# Patient Record
Sex: Male | Born: 1940 | Race: White | Hispanic: No | Marital: Married
Health system: Southern US, Community
[De-identification: ages and names within clinical notes are randomized; demographics above are authoritative.]

## PROBLEM LIST (undated history)

## (undated) DIAGNOSIS — I509 Heart failure, unspecified: Secondary | ICD-10-CM

## (undated) DIAGNOSIS — G479 Sleep disorder, unspecified: Secondary | ICD-10-CM

## (undated) DIAGNOSIS — I639 Cerebral infarction, unspecified: Secondary | ICD-10-CM

## (undated) DIAGNOSIS — Z7901 Long term (current) use of anticoagulants: Secondary | ICD-10-CM

## (undated) DIAGNOSIS — I5042 Chronic combined systolic (congestive) and diastolic (congestive) heart failure: Secondary | ICD-10-CM

## (undated) DIAGNOSIS — I340 Nonrheumatic mitral (valve) insufficiency: Secondary | ICD-10-CM

## (undated) DIAGNOSIS — I428 Other cardiomyopathies: Secondary | ICD-10-CM

## (undated) DIAGNOSIS — Z95810 Presence of automatic (implantable) cardiac defibrillator: Secondary | ICD-10-CM

## (undated) DIAGNOSIS — I472 Ventricular tachycardia, unspecified: Secondary | ICD-10-CM

## (undated) DIAGNOSIS — F32A Depression, unspecified: Secondary | ICD-10-CM

## (undated) DIAGNOSIS — I5020 Unspecified systolic (congestive) heart failure: Secondary | ICD-10-CM

## (undated) DIAGNOSIS — B356 Tinea cruris: Secondary | ICD-10-CM

## (undated) DIAGNOSIS — I4891 Unspecified atrial fibrillation: Secondary | ICD-10-CM

## (undated) DIAGNOSIS — R0602 Shortness of breath: Secondary | ICD-10-CM

## (undated) DIAGNOSIS — I2510 Atherosclerotic heart disease of native coronary artery without angina pectoris: Secondary | ICD-10-CM

## (undated) DIAGNOSIS — F0280 Dementia in other diseases classified elsewhere without behavioral disturbance: Secondary | ICD-10-CM

## (undated) DIAGNOSIS — G44229 Chronic tension-type headache, not intractable: Secondary | ICD-10-CM

## (undated) DIAGNOSIS — R011 Cardiac murmur, unspecified: Secondary | ICD-10-CM

## (undated) DIAGNOSIS — I4819 Other persistent atrial fibrillation: Secondary | ICD-10-CM

## (undated) DIAGNOSIS — B351 Tinea unguium: Secondary | ICD-10-CM

## (undated) DIAGNOSIS — I071 Rheumatic tricuspid insufficiency: Secondary | ICD-10-CM

## (undated) DIAGNOSIS — N183 Chronic kidney disease, stage 3 unspecified: Secondary | ICD-10-CM

## (undated) DIAGNOSIS — R001 Bradycardia, unspecified: Secondary | ICD-10-CM

## (undated) DIAGNOSIS — F329 Major depressive disorder, single episode, unspecified: Secondary | ICD-10-CM

## (undated) DIAGNOSIS — I10 Essential (primary) hypertension: Secondary | ICD-10-CM

## (undated) DIAGNOSIS — R55 Syncope and collapse: Secondary | ICD-10-CM

## (undated) DIAGNOSIS — K449 Diaphragmatic hernia without obstruction or gangrene: Secondary | ICD-10-CM

## (undated) DIAGNOSIS — Z950 Presence of cardiac pacemaker: Secondary | ICD-10-CM

## (undated) DIAGNOSIS — I5023 Acute on chronic systolic (congestive) heart failure: Secondary | ICD-10-CM

## (undated) DIAGNOSIS — I209 Angina pectoris, unspecified: Secondary | ICD-10-CM

## (undated) DIAGNOSIS — M1990 Unspecified osteoarthritis, unspecified site: Secondary | ICD-10-CM

## (undated) DIAGNOSIS — J189 Pneumonia, unspecified organism: Secondary | ICD-10-CM

## (undated) HISTORY — DX: Diaphragmatic hernia without obstruction or gangrene: K44.9

## (undated) HISTORY — DX: Chronic tension-type headache, not intractable: G44.229

## (undated) HISTORY — DX: Cerebral infarction, unspecified: I63.9

## (undated) HISTORY — DX: Tinea unguium: B35.1

## (undated) HISTORY — DX: Syncope and collapse: R55

## (undated) HISTORY — DX: Ventricular tachycardia, unspecified: I47.20

## (undated) HISTORY — DX: Sleep disorder, unspecified: G47.9

## (undated) HISTORY — PX: INSERT / REPLACE / REMOVE PACEMAKER: SUR710

## (undated) HISTORY — DX: Bradycardia, unspecified: R00.1

## (undated) HISTORY — DX: Tinea cruris: B35.6

## (undated) HISTORY — DX: Unspecified atrial fibrillation: I48.91

## (undated) HISTORY — DX: Ventricular tachycardia: I47.2

## (undated) HISTORY — DX: Essential (primary) hypertension: I10

## (undated) HISTORY — DX: Atherosclerotic heart disease of native coronary artery without angina pectoris: I25.10

## (undated) HISTORY — PX: VASECTOMY: SHX75

---

## 1995-04-22 DIAGNOSIS — I428 Other cardiomyopathies: Secondary | ICD-10-CM

## 1995-04-22 HISTORY — DX: Other cardiomyopathies: I42.8

## 2000-02-19 ENCOUNTER — Encounter: Payer: Self-pay | Admitting: Family Medicine

## 2000-02-19 ENCOUNTER — Encounter: Admission: RE | Admit: 2000-02-19 | Discharge: 2000-02-19 | Payer: Self-pay | Admitting: Family Medicine

## 2001-03-31 ENCOUNTER — Encounter: Admission: RE | Admit: 2001-03-31 | Discharge: 2001-03-31 | Payer: Self-pay | Admitting: Family Medicine

## 2001-03-31 ENCOUNTER — Encounter: Payer: Self-pay | Admitting: Family Medicine

## 2001-06-05 ENCOUNTER — Emergency Department (HOSPITAL_COMMUNITY): Admission: EM | Admit: 2001-06-05 | Discharge: 2001-06-05 | Payer: Self-pay | Admitting: Emergency Medicine

## 2003-01-30 ENCOUNTER — Encounter: Payer: Self-pay | Admitting: Emergency Medicine

## 2003-01-30 ENCOUNTER — Encounter: Payer: Self-pay | Admitting: Neurology

## 2003-01-30 ENCOUNTER — Inpatient Hospital Stay (HOSPITAL_COMMUNITY): Admission: EM | Admit: 2003-01-30 | Discharge: 2003-02-12 | Payer: Self-pay | Admitting: Emergency Medicine

## 2003-01-31 ENCOUNTER — Encounter (INDEPENDENT_AMBULATORY_CARE_PROVIDER_SITE_OTHER): Payer: Self-pay | Admitting: Cardiology

## 2003-02-02 ENCOUNTER — Encounter: Payer: Self-pay | Admitting: Neurology

## 2003-02-03 HISTORY — PX: CARDIAC CATHETERIZATION: SHX172

## 2003-02-04 ENCOUNTER — Encounter: Payer: Self-pay | Admitting: Neurology

## 2003-02-08 ENCOUNTER — Encounter: Payer: Self-pay | Admitting: Internal Medicine

## 2003-04-24 ENCOUNTER — Encounter (HOSPITAL_COMMUNITY): Admission: RE | Admit: 2003-04-24 | Discharge: 2003-07-23 | Payer: Self-pay | Admitting: Cardiology

## 2004-04-23 ENCOUNTER — Ambulatory Visit: Payer: Self-pay

## 2004-09-12 ENCOUNTER — Emergency Department (HOSPITAL_COMMUNITY): Admission: EM | Admit: 2004-09-12 | Discharge: 2004-09-12 | Payer: Self-pay | Admitting: Emergency Medicine

## 2005-08-20 ENCOUNTER — Encounter: Payer: Self-pay | Admitting: Neurology

## 2006-01-01 ENCOUNTER — Ambulatory Visit: Payer: Self-pay | Admitting: Family Medicine

## 2006-02-05 ENCOUNTER — Ambulatory Visit: Payer: Self-pay | Admitting: Family Medicine

## 2006-03-06 ENCOUNTER — Ambulatory Visit: Payer: Self-pay

## 2006-04-27 ENCOUNTER — Ambulatory Visit: Payer: Self-pay | Admitting: Family Medicine

## 2006-12-18 ENCOUNTER — Encounter: Admission: RE | Admit: 2006-12-18 | Discharge: 2006-12-18 | Payer: Self-pay | Admitting: Family Medicine

## 2006-12-18 ENCOUNTER — Ambulatory Visit: Payer: Self-pay | Admitting: Family Medicine

## 2007-01-19 ENCOUNTER — Ambulatory Visit: Payer: Self-pay | Admitting: Family Medicine

## 2007-02-11 ENCOUNTER — Ambulatory Visit: Payer: Self-pay | Admitting: Family Medicine

## 2007-04-05 ENCOUNTER — Ambulatory Visit: Payer: Self-pay | Admitting: Family Medicine

## 2007-09-02 ENCOUNTER — Ambulatory Visit: Payer: Self-pay | Admitting: Family Medicine

## 2007-12-15 ENCOUNTER — Ambulatory Visit: Payer: Self-pay | Admitting: Family Medicine

## 2008-06-06 ENCOUNTER — Inpatient Hospital Stay (HOSPITAL_COMMUNITY): Admission: EM | Admit: 2008-06-06 | Discharge: 2008-06-09 | Payer: Self-pay | Admitting: Emergency Medicine

## 2008-06-06 ENCOUNTER — Ambulatory Visit: Payer: Self-pay | Admitting: Internal Medicine

## 2008-06-12 ENCOUNTER — Ambulatory Visit: Payer: Self-pay | Admitting: Internal Medicine

## 2008-06-12 ENCOUNTER — Observation Stay (HOSPITAL_COMMUNITY): Admission: RE | Admit: 2008-06-12 | Discharge: 2008-06-13 | Payer: Self-pay | Admitting: Internal Medicine

## 2008-06-12 HISTORY — PX: OTHER SURGICAL HISTORY: SHX169

## 2008-06-30 ENCOUNTER — Encounter: Payer: Self-pay | Admitting: Internal Medicine

## 2008-07-03 ENCOUNTER — Ambulatory Visit: Payer: Self-pay

## 2008-07-13 ENCOUNTER — Ambulatory Visit: Payer: Self-pay | Admitting: Family Medicine

## 2008-08-15 ENCOUNTER — Ambulatory Visit: Payer: Self-pay | Admitting: Family Medicine

## 2008-09-28 DIAGNOSIS — I428 Other cardiomyopathies: Secondary | ICD-10-CM

## 2008-09-28 DIAGNOSIS — Z95810 Presence of automatic (implantable) cardiac defibrillator: Secondary | ICD-10-CM | POA: Insufficient documentation

## 2008-09-28 DIAGNOSIS — I6350 Cerebral infarction due to unspecified occlusion or stenosis of unspecified cerebral artery: Secondary | ICD-10-CM | POA: Insufficient documentation

## 2008-09-28 DIAGNOSIS — G479 Sleep disorder, unspecified: Secondary | ICD-10-CM | POA: Insufficient documentation

## 2008-09-28 DIAGNOSIS — I472 Ventricular tachycardia, unspecified: Secondary | ICD-10-CM | POA: Insufficient documentation

## 2008-09-28 DIAGNOSIS — R0609 Other forms of dyspnea: Secondary | ICD-10-CM | POA: Insufficient documentation

## 2008-09-28 DIAGNOSIS — I4819 Other persistent atrial fibrillation: Secondary | ICD-10-CM

## 2008-09-28 DIAGNOSIS — I2510 Atherosclerotic heart disease of native coronary artery without angina pectoris: Secondary | ICD-10-CM | POA: Insufficient documentation

## 2008-09-28 DIAGNOSIS — R404 Transient alteration of awareness: Secondary | ICD-10-CM | POA: Insufficient documentation

## 2008-09-28 DIAGNOSIS — R0989 Other specified symptoms and signs involving the circulatory and respiratory systems: Secondary | ICD-10-CM

## 2008-10-10 ENCOUNTER — Ambulatory Visit: Payer: Self-pay | Admitting: Internal Medicine

## 2008-12-07 ENCOUNTER — Ambulatory Visit: Payer: Self-pay | Admitting: Family Medicine

## 2008-12-11 ENCOUNTER — Encounter: Payer: Self-pay | Admitting: Internal Medicine

## 2008-12-12 ENCOUNTER — Encounter: Admission: RE | Admit: 2008-12-12 | Discharge: 2008-12-12 | Payer: Self-pay | Admitting: Cardiovascular Disease

## 2008-12-13 ENCOUNTER — Telehealth (INDEPENDENT_AMBULATORY_CARE_PROVIDER_SITE_OTHER): Payer: Self-pay | Admitting: *Deleted

## 2008-12-20 ENCOUNTER — Ambulatory Visit: Payer: Self-pay | Admitting: Internal Medicine

## 2009-03-02 ENCOUNTER — Encounter: Payer: Self-pay | Admitting: Internal Medicine

## 2009-05-29 ENCOUNTER — Encounter: Admission: RE | Admit: 2009-05-29 | Discharge: 2009-05-29 | Payer: Self-pay | Admitting: Cardiovascular Disease

## 2009-08-06 ENCOUNTER — Ambulatory Visit: Payer: Self-pay | Admitting: Family Medicine

## 2009-08-06 ENCOUNTER — Encounter: Admission: RE | Admit: 2009-08-06 | Discharge: 2009-08-06 | Payer: Self-pay | Admitting: Family Medicine

## 2009-11-26 ENCOUNTER — Encounter: Payer: Self-pay | Admitting: Internal Medicine

## 2009-11-28 ENCOUNTER — Encounter: Admission: RE | Admit: 2009-11-28 | Discharge: 2009-11-28 | Payer: Self-pay | Admitting: Cardiovascular Disease

## 2010-01-03 ENCOUNTER — Ambulatory Visit: Payer: Self-pay | Admitting: Family Medicine

## 2010-01-03 ENCOUNTER — Encounter: Admission: RE | Admit: 2010-01-03 | Discharge: 2010-01-03 | Payer: Self-pay | Admitting: Family Medicine

## 2010-01-07 ENCOUNTER — Encounter (INDEPENDENT_AMBULATORY_CARE_PROVIDER_SITE_OTHER): Payer: Self-pay | Admitting: *Deleted

## 2010-05-21 NOTE — Letter (Signed)
Summary: Appointment - Reminder 2  [employer], Main Office  [address]   [place], [place] [address]   Phone: [phone]  Fax: [phone]     January 07, 2010 MRN: [id number]   Nathan Mckinney [address] [place], [place]  [address]   Dear [name],  Our records indicate that it is time to schedule a follow-up appointment.  Dr.Taylor          recommended that you follow up with [place] in October           . It is very important that we reach you to schedule this appointment. We look forward to participating in your health care needs. Please contact us at the number listed above at your earliest convenience to schedule your appointment.  If you are unable to make an appointment at this time, give [place] a call so we can update our records.     Sincerely,  Altha Harm, LPN  January 07, 2010 9:00 AM  [facility] Scheduling Team

## 2010-05-21 NOTE — Letter (Signed)
Summary: [facility]   [facility]   Imported By: Roderic Ovens 05/10/2009 13:57:48  _____________________________________________________________________  External Attachment:    Type:   Image     Comment:   External Document

## 2010-07-30 ENCOUNTER — Institutional Professional Consult (permissible substitution) (INDEPENDENT_AMBULATORY_CARE_PROVIDER_SITE_OTHER): Payer: Medicare Other | Admitting: Family Medicine

## 2010-07-30 ENCOUNTER — Ambulatory Visit
Admission: RE | Admit: 2010-07-30 | Discharge: 2010-07-30 | Disposition: A | Discharge status: Home / Self Care | Payer: Medicare Other | Source: Ambulatory Visit | Attending: Family Medicine | Admitting: Family Medicine

## 2010-07-30 ENCOUNTER — Other Ambulatory Visit: Payer: Self-pay | Admitting: Family Medicine

## 2010-07-30 DIAGNOSIS — M25519 Pain in unspecified shoulder: Secondary | ICD-10-CM

## 2010-07-30 DIAGNOSIS — M25511 Pain in right shoulder: Secondary | ICD-10-CM

## 2010-07-30 DIAGNOSIS — M109 Gout, unspecified: Secondary | ICD-10-CM

## 2010-08-06 LAB — CBC
HCT: 40 % (ref 39.0–52.0)
HCT: 44.1 % (ref 39.0–52.0)
HCT: 45.6 % (ref 39.0–52.0)
Hemoglobin: 13.6 g/dL (ref 13.0–17.0)
Hemoglobin: 14.2 g/dL (ref 13.0–17.0)
Hemoglobin: 14.6 g/dL (ref 13.0–17.0)
Hemoglobin: 15 g/dL (ref 13.0–17.0)
Hemoglobin: 15.4 g/dL (ref 13.0–17.0)
MCHC: 33.3 g/dL (ref 30.0–36.0)
MCHC: 33.9 g/dL (ref 30.0–36.0)
MCV: 88.1 fL (ref 78.0–100.0)
Platelets: 147 10*3/uL — ABNORMAL LOW (ref 150–400)
RBC: 4.79 MIL/uL (ref 4.22–5.81)
RBC: 4.95 MIL/uL (ref 4.22–5.81)
RBC: 5.17 MIL/uL (ref 4.22–5.81)
RDW: 15 % (ref 11.5–15.5)
RDW: 15.1 % (ref 11.5–15.5)
WBC: 5.7 10*3/uL (ref 4.0–10.5)
WBC: 6.9 10*3/uL (ref 4.0–10.5)
WBC: 7.3 10*3/uL (ref 4.0–10.5)

## 2010-08-06 LAB — T4: T4, Total: 9 ug/dL (ref 5.0–12.5)

## 2010-08-06 LAB — PROTIME-INR
INR: 1.7 — ABNORMAL HIGH (ref 0.00–1.49)
INR: 2.1 — ABNORMAL HIGH (ref 0.00–1.49)
INR: 2.9 — ABNORMAL HIGH (ref 0.00–1.49)
INR: 3.9 — ABNORMAL HIGH (ref 0.00–1.49)
Prothrombin Time: 21.2 seconds — ABNORMAL HIGH (ref 11.6–15.2)
Prothrombin Time: 25.6 seconds — ABNORMAL HIGH (ref 11.6–15.2)
Prothrombin Time: 32.4 seconds — ABNORMAL HIGH (ref 11.6–15.2)
Prothrombin Time: 41.3 seconds — ABNORMAL HIGH (ref 11.6–15.2)
Prothrombin Time: 42.4 seconds — ABNORMAL HIGH (ref 11.6–15.2)

## 2010-08-06 LAB — BASIC METABOLIC PANEL
BUN: 27 mg/dL — ABNORMAL HIGH (ref 6–23)
BUN: 29 mg/dL — ABNORMAL HIGH (ref 6–23)
CO2: 26 mEq/L (ref 19–32)
CO2: 26 mEq/L (ref 19–32)
CO2: 28 mEq/L (ref 19–32)
Calcium: 8.8 mg/dL (ref 8.4–10.5)
Calcium: 8.8 mg/dL (ref 8.4–10.5)
Calcium: 8.9 mg/dL (ref 8.4–10.5)
Chloride: 103 mEq/L (ref 96–112)
Chloride: 106 mEq/L (ref 96–112)
Chloride: 109 mEq/L (ref 96–112)
Creatinine, Ser: 1.9 mg/dL — ABNORMAL HIGH (ref 0.4–1.5)
Creatinine, Ser: 1.94 mg/dL — ABNORMAL HIGH (ref 0.4–1.5)
GFR calc Af Amer: 36 mL/min — ABNORMAL LOW (ref >60)
GFR calc Af Amer: 41 mL/min — ABNORMAL LOW (ref >60)
GFR calc Af Amer: 42 mL/min — ABNORMAL LOW (ref >60)
GFR calc Af Amer: 43 mL/min — ABNORMAL LOW (ref >60)
GFR calc non Af Amer: 30 mL/min — ABNORMAL LOW (ref >60)
Glucose, Bld: 110 mg/dL — ABNORMAL HIGH (ref 70–99)
Glucose, Bld: 99 mg/dL (ref 70–99)
Potassium: 4.1 mEq/L (ref 3.5–5.1)
Potassium: 4.5 mEq/L (ref 3.5–5.1)
Sodium: 139 mEq/L (ref 135–145)
Sodium: 140 mEq/L (ref 135–145)
Sodium: 140 mEq/L (ref 135–145)
Sodium: 142 mEq/L (ref 135–145)

## 2010-08-06 LAB — POCT CARDIAC MARKERS
Myoglobin, poc: 110 ng/mL (ref 12–200)
Troponin i, poc: <0.05 ng/mL (ref 0.00–0.09)

## 2010-08-06 LAB — BRAIN NATRIURETIC PEPTIDE
Pro B Natriuretic peptide (BNP): 1759 pg/mL — ABNORMAL HIGH (ref 0.0–100.0)
Pro B Natriuretic peptide (BNP): 320 pg/mL — ABNORMAL HIGH (ref 0.0–100.0)
Pro B Natriuretic peptide (BNP): 816 pg/mL — ABNORMAL HIGH (ref 0.0–100.0)

## 2010-08-06 LAB — DIFFERENTIAL
Basophils Absolute: 0 10*3/uL (ref 0.0–0.1)
Basophils Absolute: 0 10*3/uL (ref 0.0–0.1)
Basophils Relative: 0 % (ref 0–1)
Eosinophils Relative: 1 % (ref 0–5)
Lymphocytes Relative: 15 % (ref 12–46)
Lymphocytes Relative: 8 % — ABNORMAL LOW (ref 12–46)
Monocytes Absolute: 0.6 10*3/uL (ref 0.1–1.0)
Monocytes Relative: 8 % (ref 3–12)
Neutro Abs: 5.4 10*3/uL (ref 1.7–7.7)

## 2010-08-06 LAB — CARDIAC PANEL(CRET KIN+CKTOT+MB+TROPI)
CK, MB: 2.8 ng/mL (ref 0.3–4.0)
Relative Index: INVALID (ref 0.0–2.5)
Relative Index: INVALID (ref 0.0–2.5)
Total CK: 54 U/L (ref 7–232)
Total CK: 59 U/L (ref 7–232)
Troponin I: 0.01 ng/mL (ref 0.00–0.06)

## 2010-08-06 LAB — TSH: TSH: 6.56 u[IU]/mL — ABNORMAL HIGH (ref 0.350–4.500)

## 2010-08-06 LAB — APTT
aPTT: 37 seconds (ref 24–37)
aPTT: 58 seconds — ABNORMAL HIGH (ref 24–37)

## 2010-08-06 LAB — HEMOGLOBIN A1C: Hgb A1c MFr Bld: 6.1 % (ref 4.6–6.1)

## 2010-08-06 LAB — LIPID PANEL
HDL: 32 mg/dL — ABNORMAL LOW (ref >39)
Total CHOL/HDL Ratio: 4.3 RATIO
VLDL: 31 mg/dL (ref 0–40)

## 2010-09-03 NOTE — Op Note (Signed)
NAME:  Nathan Mckinney, Nathan Mckinney NO.:  [id number]   MEDICAL RECORD NO.:  [id number]          PATIENT TYPE:  INP   LOCATION:  3741                         FACILITY:  [facility]   PHYSICIAN:  Duke Salvia, MD, FACCDATE OF BIRTH:  Aug 03, 1940   DATE OF PROCEDURE:  06/09/2008  DATE OF DISCHARGE:  06/09/2008                               OPERATIVE REPORT   PREOPERATIVE DIAGNOSIS:  Previously implanted implantable cardioverter-  defibrillator.   POSTOPERATIVE DIAGNOSIS:  Previously implanted implantable cardioverter-  defibrillator.   PROCEDURE:  Left upper extremity venogram.   The patient was brought to the electrophysiology laboratory and injected  with 10 mL of contrast.  Venography demonstrated patency of the left  subclavian vein as well as some stenosis.  Outflow was continuous.   The patient tolerated the procedure well.      Duke Salvia, MD, [facility]  Electronically Signed     SCK/MEDQ  D:  10/26/2008  T:  10/27/2008  Job:  [id number]

## 2010-09-03 NOTE — Discharge Summary (Signed)
NAME:  Nathan Mckinney, MCCAUSLIN NO.:  [id number]   MEDICAL RECORD NO.:  [id number]          PATIENT TYPE:  INP   LOCATION:  3703                         FACILITY:  [facility]   PHYSICIAN:  Doylene Canning. Ladona Ridgel, MD    DATE OF BIRTH:  1940/11/21   DATE OF ADMISSION:  06/12/2008  DATE OF DISCHARGE:  06/13/2008                               DISCHARGE SUMMARY   This patient has allergy to SULFA.    Time for this dictation and examination, explanation to the patient,  greater than 45 minutes.   FINAL DIAGNOSIS:  Discharging day 1 status post implant of Medtronic  CONCERTO CRT-D system - explant existing implantable cardioverter-  defibrillator.  A.  This is an upgrade, and his device was approaching elective  replacement indicator.   SECONDARY DIAGNOSES:  1. Nonischemic cardiomyopathy, ejection fraction 25-35%.  2. Recent hospitalization from June 06, 2008, to June 09, 2008, for acute on chronic systolic congestive heart failure.  3. New York Heart Association Class III chronic systolic congestive      heart failure.  4. Implantable cardioverter-defibrillator implanted in 2004 for      syncope/nonsustained ventricular tachycardia.  5. Chronic renal insufficiency, stage III.  6. Anticoagulation for pulmonary atrioventricular fistula.  7. Hypertension.  8. Dyslipidemia.  9. Mild dementia.   PROCEDURE:  June 12, 2008, explantation of existing Medtronic  cardioverter-defibrillator with implant of a Medtronic CONCERTO  biventricular implantable cardioverter-defibrillator.  The patient's QRS  at discharge is 130-140 milliseconds and he is biventricular pacing.   BRIEF HISTORY:  Nathan Mckinney is a 70 year old male.  He has a long-  standing nonischemic cardiomyopathy.  This dates back to 73.  He had a  stroke in 2004.  It was complicated by syncope.  His neurologic recovery  has been quite good.  While in the hospital, he had recurrent episodes  of sinus node  dysfunction.  He had pauses up to 5 seconds.  He also had  nonsustained ventricular tachycardia with rapid rates.  Because of these  cardiac issues, it was elected to proceed with ICD implantation.  He  received a dual-chamber Maximo device.  He has had no discharges since  implantation.   The patient has been hospitalized from June 06, 2008, to June 09, 2008, for acute on chronic congestive heart failure.  His symptoms  were progressive weakness, dyspnea, and fatigue.  Interrogation of his  device at this hospitalization showed that it was approaching ERI.  His  QRS duration was about 135 milliseconds with an IVCD  pattern.  He was  referred for consideration of CRT upgrade.   Nathan Mckinney has progressive shortness of breath, which is quite limiting  to his activities of daily living.  This is accompanied by fatigue and  weakness.  He does have a dry cough, and he is on amiodarone.  Amiodarone lung toxicity needs to be considered, and the amiodarone will  probably be discontinued on this admission.  Prior to discharge, we will  obtain a pulmonary function test.  If his values are quite low,  consideration will be given to steroid therapy for possible amiodarone  lung toxicity.   The patient will require ICD generator implantation for generator  change, and CRT upgrade will be considered at that time.  Proceeding  with CRT upgrade is probably the right thing at this time, sooner rather  than later.  We will have the patient, once he is discharged, return to  [facility] for generator change with upgrade to a BiV ICD.   HOSPITAL COURSE:  The patient presents electively on June 12, 2008.  His creatinine on June 09, 2008, was 2.21.  His creatinine on  admission, June 12, 2008, was 1.9.  The patient had an INR of 1.7 on  admission, and it was elected that the patient would undergo BiV ICD  upgrade.  This procedure went smoothly.  The patient had left   ventricular lead placed without complication.  He had a defibrillator  threshold study less than or equal to 15 joules by Dr. Lewayne Bunting.  The patient had no postprocedural hematoma or ecchymoses and is  discharging on postprocedure day #1.  The patient will go home on his  regular medications.  He is asked to keep his incision dry for next 7  days and to sponge bathe until Monday, June 19, 2008.  Mobility of the  left arm has been discussed with him.   MEDICATIONS AT DISCHARGE:  1. Enteric-coated aspirin 81 mg daily.  2. Lasix 40 mg twice daily.  3. Allopurinol 100 mg daily.  4. Zocor 20 mg daily at bedtime.  5. Coreg 3.125 mg twice daily.  6. Aricept 5 mg daily at bedtime.  7. Coumadin 5 mg on Monday, Wednesday, and Friday and 2.5 mg or one-      half tablet on Tuesday, Thursday, Saturday, and Sunday.  8. At his last hospitalization, Diovan 40 mg was considered.  The      patient had slightly elevated creatinine and it was placed on hold.      Diovan 40 mg will probably be proposed by Dr. Elsie Lincoln at his office      visit coming up.  9. Also, the patient goes home with a prescription for clindamycin 300      mg tablets 1 tablet 3 times daily for the next 5 days as      prophylaxis against pocket infection of his ICD.   The patient has followup at both [facility].  1. Coumadin Clinic at [facility] on Tuesday, June 20, 2008, at      noon.  2. ICD Clinic at [facility] [address] on      Monday, July 03, 2008, at 9 o'clock.  3. Blood work at the first floor clinic just beneath [employer] and [facility]ascular on Wednesday, July 12, 2008.  He can go any      time.  He will be given a prescription for this blood work.  4. Office visit with Dr. Elsie Lincoln, [facility]      Center on Friday, July 14, 2008, at 2 o'clock.  5. Dr. Ladona Ridgel at [facility] [address]  on      Tuesday, October 10, 2008, at 2 o'clock.   LABORATORY STUDIES PERTINENT TO THIS ADMISSION:  On June 09, 2008, a  complete blood count, hemoglobin 15.4, hematocrit 45.6, white cells 6.9,  platelets of 166.  Serum  electrolytes on June 12, 2008, on the day  of admission for his ICD upgrade, sodium 140, potassium 4.5, chloride  106, carbonate 26, BUN is 33, creatinine 1.9, glucose 102.  Pro time on  June 12, 2008, 21.2, INR is 1.7.  The INR on the day of discharge is  2.2.  The  patient had T3 study done this admission.  It was 97.6, within normal  limits.  T4 also within normal limits at 9.0.  These studies were done  because his TSH was mildly elevated at 6.56 on June 06, 2008, on  admission to [facility], his last admission.      Maple Mirza, PA      Doylene Canning. Ladona Ridgel, MD  Electronically Signed    GM/MEDQ  D:  06/13/2008  T:  06/13/2008  Job:  [id number]   cc:   Madaline Savage, M.D.

## 2010-09-03 NOTE — Consult Note (Signed)
NAME:  ELLARD, NAN NO.:  [id number]   MEDICAL RECORD NO.:  [id number]          PATIENT TYPE:  INP   LOCATION:  2610                         FACILITY:  [facility]   PHYSICIAN:  Duke Salvia, MD, FACCDATE OF BIRTH:  Jan 24, 1941   DATE OF CONSULTATION:  06/07/2008  DATE OF DISCHARGE:                                 CONSULTATION   HISTORY OF PRESENT ILLNESS:  Thank you very much for asking [place] to see  Mr. Kamarii Carton in consultation regarding options related to  defibrillator upgrade at the time of device replacement.   Mr. Lever is a 70 year old gentleman with a longstanding known  nonischemic cardiomyopathy dating back to 72.  He had a stroke in 2004  complicated by syncope.  Neurological recovery was quite good except for  memory issues.  While in hospital, he had recurrent episodes of severe  sinus node dysfunction with pauses of up to 5 seconds as well as  nonsustained VT with rapid rates.  Because of that, it was elected to  proceed with ICD implantation and he received a dual-chamber maximal  device.  He has had no discharges.   For reasons that were not clear, old chart refers to AF and VT,  amiodarone was started in the summer of 2009.  Through the fall of 2009,  he has had progressive problems with shortness of breath and a dry cough  and fatigue.  He was hospitalized on this occasion because of  progressive weakness and fatigue.  Interrogation of his ICD demonstrated  that he was approaching ERI.  His electrocardiogram was noted to have a  QRS duration of about 135 milliseconds with an IVCD pattern and he was  referred for consideration of CRT upgrade.   He has significant dyspnea on exertion at less than 100 feet.  Interestingly, he does not have peripheral edema.  He has nocturnal  dyspnea, although it is not relieved by sitting up.  He has had no  fevers.  There has been no intercurrent syncope and no significant  palpitations.   His weights  have been stable.   Thromboembolic risk factors are notable for hypertension, congestive  heart failure, age, and prior stroke.   PAST MEDICAL HISTORY:  In addition, was notable for dyslipidemia, modest  renal insufficiency, currently grade 3.  Modest number of issues  occurring as a sequelae of his stroke and gout.   REVIEW OF SYSTEMS:  Also notable for atypical chest pain for which he  underwent Myoview scan in June 2009, which was negative.  Sleep  disordered breathing and daytime somnolence.   SOCIAL HISTORY:  He is married.  He is a retired Architect and Scientist, forensic.  He  does not use cigarettes.   MEDICATIONS:  On arrival included:  1. Aspirin 81.  2. Altace.  3. Furosemide 20.  4. Coumadin.  5. Aricept 5.  6. Potassium 5.  7. Allopurinol.  8. Zocor 40.  9. Bystolic 5.  10.Amiodarone 200 b.i.d.   PHYSICAL EXAMINATION:  GENERAL:  He is an elderly Caucasian male  appearing his stated age of 70,  lying flat in bed, conversant, oriented  and alert.  Memory issues not apparent.  HEENT:  No icterus or xanthoma.  NECK:  There is no thyromegaly.  His neck veins were 7 cm.  His carotids  were brisk and full bilaterally without bruits.  BACK:  Without kyphosis or scoliosis.  LUNGS:  Clear.  HEART:  Heart sounds were regular with an RV lift.  There was a  sustained PMI.  S1 was diminished.  There was a harsh 3/6 holosystolic  murmur heard around the apex.  ABDOMEN:  Soft with active bowel sounds without midline pulsation.  Hepatojugular reflux was present.  Femoral pulses were 2+.  Distal  pulses were intact.  There was no clubbing, cyanosis, or edema.  NEUROLOGIC:  Grossly normal.  SKIN:  Warm and dry.   DIAGNOSTIC DATA:  Electrocardiogram dated June 07, 2008 demonstrated  sinus rhythm at 74 with 0.19/0.13/0.46.  Electrocardiogram from the day  before demonstrated a QRS duration of 134 and electrocardiogram from  December demonstrates a QRS duration of 122.   IMPRESSION:   1. Dyspnea with exertion, progressive over 3 months, question      congestive heart failure based on elevated BNP versus amiodarone.  2. Nonischemic cardiomyopathy with a negative Myoview about 6 months      ago with evidence of mitral regurgitation.  3. Status post cerebrovascular accident with some memory issues.  4. History of syncope at the time of the stroke with documented      nonsustained ventricular tachycardia as well as sinus node      dysfunction with pauses of greater than 5 seconds.  5. Dual-chamber implantable cardioverter defibrillator implantation      (MDT), 2004.  6. Amiodarone therapy initiated in June 2009, question indication.  7. Atrial fibrillation.  8. Thromboembolic risk factors notable for transient ischemic attack,      hypertension, age, and congestive heart failure.  9. Elevated INR.  10.Sleep-disordered breathing and daytime somnolence.   DISCUSSION:  Mr. Nestle has progressive shortness of breath which is  quite limiting.  He is accompanied by fatigue and weakness.  Notably, he  does not have nocturnal dyspnea relieved by sitting, suggesting that  this may be an underlying pulmonary process.  He does have a dry cough.  Amiodarone lung toxicity certainly needs to be considered and it should  probably be discontinued.   Alternative explanations for his dyspnea include congestive heart  failure as suggested by his BNP.  His QRS duration ranges from 122-134,  so certainly within the range of the patients who have been studied and  have found to respond to resynchronization therapy.  The fact that there  is an IVCD as a post left bundle-branch block pertains a somewhat worse  response rate.  Nonischemic myopathy patients though typically respond  better.   With an atypical chest pain and it is having been some years since he  had been catheterized, another question as whether a progressive  coronary disease is contributing to his progressive exercise   deterioration and consideration could be given to repeat catheterization  notwithstanding his negative Myoview from June.  The other is that he  appears to sleep-disordered breathing and daytime somnolence and I  wonder to what degree a sleep study might be of benefit.   RECOMMENDATIONS:  Based on the above, I would therefore:  1. Discontinue his amiodarone.  2. Obtain a pulmonary function test and measure the DLCO.  In the  event that it is quite low, consideration should be given to      steroid therapy for possible amiodarone lung toxicity and perhaps      pulmonary consultation could be of help.  3. Consideration of ICD generator placement and CRT upgrade.      Unfortunately, the time course for amiodarone lung injury is long      and the options of waiting here are not so long and it maybe that      proceeding with CRT upgrade is the right thing to do as sooner      rather than later.  There is certainly data supported and hospital      upgrade in patients who have significant congestive symptoms as      does Mr. Voisin with his progressive class III heart failure.  The      question could also be raised with his dementia as to whether his      ICD should be replaced by a CRT-P device or CRT-D.  Not knowing the      patient, I will have to defer that to Dr. Elsie Lincoln but      notwithstanding the comments of dementia in the chart, I am quite      impressed at his alertness, engagement, and mental functioning      status.   I will have Dr. Ladona Ridgel follow up in the morning with Dr. Elsie Lincoln.  We  will implant a few out of time frame at that point.      Duke Salvia, MD, [facility]  Electronically Signed     SCK/MEDQ  D:  06/07/2008  T:  06/08/2008  Job:  [id number]   cc:   Madaline Savage, M.D.

## 2010-09-03 NOTE — Op Note (Signed)
NAME:  Nathan Mckinney, Nathan Mckinney NO.:  [id number]   MEDICAL RECORD NO.:  [id number]          PATIENT TYPE:  INP   LOCATION:  3703                         FACILITY:  [facility]   PHYSICIAN:  Doylene Canning. Ladona Ridgel, MD    DATE OF BIRTH:  06-06-1940   DATE OF PROCEDURE:  06/12/2008  DATE OF DISCHARGE:                               OPERATIVE REPORT   PROCEDURE PERFORMED:  Upgrade from a dual-chamber implantable  cardioverter-defibrillator to a biventricular implantable cardioverter-  defibrillator with insertion of a new right ventricular rate/sense lead  secondary to the patient's 6949 lead being placed.   INTRODUCTION:  The patient is a 70 year old male with a longstanding  ischemic cardiomyopathy and congestive heart failure with worsening  heart failure over the last several months.  He has developed  ventricular tachycardia as well (his device had been placed initially  for primary prevention.  The patient's QRS duration is increased up to  approximately 135 milliseconds with a left bundle-branch block QRS  morphology/IVCD and he is now referred for insertion of a new  biventricular device.   PROCEDURE:  After informed consent was obtained, the patient was taken  to the Diagnostic EP Lab in a fasting state.  After usual preparation  and draping, intravenous fentanyl and midazolam was given for sedation.  A 30 mL of lidocaine was infiltrated into the left infraclavicular  region.  A 7-cm incision was carried out over this region.  Electrocautery was utilized to dissect down to the fascial plane.  Care  was taken not to enter the ICD pocket.  The left subclavian vein was  then punctured x2.  There was a significant amount of tortuosity and  some near subtotal occlusion of the vein itself.  However, utilizing a  Wholey wire x2, the Wholey wire was advanced into the inferior vena  cava.  Dilators were utilized to dilate up the stenotic area of the  vein, both for the LV lead and the RV  lead which was carried out  successfully.  The RV lead was subsequently placed on the RV septum and  the R-waves there measured 9 mV and the pacing impedance was 700 ohms,  the threshold was a volt at 0.2 milliseconds and 10-volt pacing did not  stimulate the diaphragm.  With the RV lead in satisfactory position,  attention was then turned for placement of the LV lead.  The coronary  sinus was cannulated without particular difficulty.  Venography of the  coronary sinus was carried out utilizing a balloon occlusion catheter.  This demonstrated a high lateral vein which was very tortuous and very  small.  A posterolateral vein was evident as well.  There were no  lateral veins to speak of.  The posterolateral vein came off at about 4  o'clock in the LAO projection and it was successfully cannulated and the  Medtronic model 4194, 88-cm bipolar pacing lead, serial number  [id number], was advanced out into the lateral vein approximately two-  thirds distance from base to apex.  In this location, diaphragmatic  stimulation was not demonstrated and threshold was approximately 3  volts  to 3.5 volts at 0.4 milliseconds.  A 10-volt pacing there as noted  previously rarely stimulated the diaphragm and a 6-volt pacing did not.  The leads in satisfactory position, there was secured to subpectoralis  fascia with a figure-of-eight silk suture.  Sewing sleeve was also  secured with silk suture.  Electrocautery was then utilized to make  subcutaneous pocket.  Kanamycin irrigation was utilized to irrigate the  pocket.  Electrocautery was utilized to assure hemostasis.  With  electrocautery, the old Medtronic dual-chamber defibrillator was  removed.  The rate sense portion of the 6949 lead was capped and the  atrial lead was analyzed where P-waves were as previously noted and  threshold was 1.5 volts at 0.4 milliseconds.  With these satisfactory  parameters, the leads were then connected to the new Medtronic  Concerto  II CRT-D BiV ICD, serial number [id number] and the device was placed  back in the subcutaneous pocket.  At this point, the patient was more  deeply sedated and defibrillation threshold testing was carried out.   As the patient was more deeply sedated with fentanyl and Versed, VF was  induced with a T-wave shock and a 15-joule shock was delivered,  terminating VF and restoring sinus rhythm.  At this point, the pocket  was again irrigated and the incision was closed with 2-0 Vicryl and 3-0  Vicryl.  Benzoin was painted on the skin, Steri-Strips were applied, and  pressure dressing was placed.  The patient was returned to his room in  satisfactory condition.   COMPLICATIONS:  There were no immediate procedure complications.   RESULTS:  This demonstrate successful upgrade of a dual-chamber ICD with  a 6949 lead to a Medtronic biventricular ICD with a decision made to  place in the RV rate/sense lead at the time of the implant.      Doylene Canning. Ladona Ridgel, MD  Electronically Signed     GWT/MEDQ  D:  06/12/2008  T:  06/13/2008  Job:  [id number]   cc:   Madaline Savage, M.D.

## 2010-09-03 NOTE — Discharge Summary (Signed)
NAME:  Nathan Mckinney, Nathan Mckinney NO.:  [id number]   MEDICAL RECORD NO.:  [id number]          PATIENT TYPE:  INP   LOCATION:  3741                         FACILITY:  [facility]   PHYSICIAN:  Richard A. Alanda Amass, M.D.DATE OF BIRTH:  [birth year]   DATE OF ADMISSION:  06/06/2008  DATE OF DISCHARGE:  06/09/2008                               DISCHARGE SUMMARY   DISCHARGE DIAGNOSIS:  1. Acute on chronic systolic congestive heart failure.  2. Dyspnea on exertion with moderate diffusion defect on pulmonary      function tests with DLCO at 18.34, 62% predicted, possibly related      to have adverse effects of amiodarone which was DC discontinued      during this admission.  3. Class III congestive heart failure.  4. Nonischemic cardiomyopathy, ejection fraction of 25-35%.  5. Coronary artery disease, nonobstructive with a heart cath in 2004      showing a 40% right coronary artery, 50% left anterior descending,      last Myoview 09/2007 which was negative for ischemia.  6. History of implantable cardioverter defibrillator in 2004 with a      Medtronic pacemaker implantation by Dr. Sharrell Ku for syncope and      nonsustained supraventricular tachycardia.  Ejection fraction at      that time was 15%.  7. Chronic renal insufficiency with a baseline probably around 1.8.  8. Anticoagulation for pulmonary arteriovenous fistula.  9. Pulmonary arteriovenous fistula.  10.Nonsustained supraventricular tachycardia.  11.Hypertension.  12.Hyperlipidemia.  13.Mild dementia.   DISCHARGE MEDICATIONS:  1. Diovan 48 mg once a day.  However, he will not start taking this      until after his ICD is placed on June 12, 2008.  He was not      given a prescription for this at the time of this discharge to      prevent any confusion at home.  2. Enteric-coated aspirin 81 mg a day.  3. Lasix 40 mg daily.  4. Allopurinol 100 mg a day.  5. Zocor 80 mg half tab daily at bedtime.  6. Coreg 3.125 mg  twice a day.   New prescription:  Aricept 5 mg a day at bedtime and Coumadin 5 mg tabs  1/2 tablet daily 2.5 mg a day.  He should stop taking Altace, Bystolic,  and amiodarone.   He is planned to come in for a bi-V ICD upgrade from his ICD.  This will  be performed on Monday June 12, 2008.  He should be in the same day  area at 8:45.  He should not eat anything after midnight on Sunday.  He  should not take his Lasix the morning of the procedure.   LABORATORY DATA:  Initial BNP on 02/17 was 1899.  On 06/09/2008, it was  320.  On the day of discharge, his sodium was 139, potassium 4.1, BUN  35, creatinine 2.21.  His hemoglobin was 15.4, hematocrit 45.6, WBCs  6.9, and platelets were 166.  TSH was 6.560.  CK-MB and troponins were  negative x3.  His INR was 2.1.  HOSPITAL COURSE:  Mr. Failla came to the emergency room because of sharp  pains in his right side and on his left side that only lasted for  seconds.  He was also complaining of shortness of breath and weakness.  He called our office and was told to come to the ER.  He was seen by Dr.  Susa Griffins and admitted for acute on chronic CHF.  Medications  were adjusted.  His Altace was discontinued.  He was given IV Lasix.  His Bystolic was discontinued.  His pacemaker was interrogated by Dr.  Susa Griffins.  He apparently had no VT recorded.  His ICD was  functioning normally.  He had no shocks but was approaching end-of-life.  ET consult was called for possible bi-V upgrade.  Thus, his Coumadin was  then held.  He was seen by Dr. Graciela Husbands on June 07, 2008.  Dr. Graciela Husbands  thought his amiodarone should be discontinued.  This had been started as  an outpatient by Dr. Lynnea Ferrier for NSVT.  Dr. Graciela Husbands ordered PFTs which did  show moderate decrease in diffusion capacity.  He recommended that he  have a bi-V upgrade.  He also thought that he should have a sleep study  in the future.  He continued to diurese and on 06/09/2008, he  was  scheduled to have a venogram and discharge after his venogram.  His BNP  was down to 320.  At the time of discharge, it was noted that his  creatinine had elevated to 2.21.  The patient was really wanted to go  home.  It was decided to decrease his Lasix to 40 mg once a day and to  discharge him home.  He will return on Monday.  He will obtain lab work  prior to his bi-V ICD upgrade.      Lezlie Octave, N.P.      Richard A. Alanda Amass, M.D.  Electronically Signed    BB/MEDQ  D:  06/09/2008  T:  06/09/2008  Job:  [id number]   cc:   Duke Salvia, MD, [facility]  Sharlot Gowda, M.D.

## 2010-09-06 NOTE — Consult Note (Signed)
NAME:  Nathan Mckinney, SPOELSTRA NO.:  [id number]   MEDICAL RECORD NO.:  [id number]                   PATIENT TYPE:  INP   LOCATION:  3103                                 FACILITY:  [facility]   PHYSICIAN:  Doylene Canning. Ladona Ridgel, M.D.               DATE OF BIRTH:  1941-04-02   DATE OF CONSULTATION:  02/06/2003  DATE OF DISCHARGE:                                   CONSULTATION   INDICATION FOR CONSULTATION:  Evaluation of nonsustained VT in the setting  of dilated cardiomyopathy and syncope.   REFERRING PHYSICIAN:  Madaline Savage, M.D. and Darlin Priestly, M.D.   HISTORY OF PRESENT ILLNESS:  The patient is a 70 year old man who was  admitted to the hospital after having a syncopal episode associated with a  stroke.  He has no significant neurological residual.  The patient is in the  hospital and has had nonsustained VT as well as bradycardia with pauses of  up to 5 seconds.  He underwent catheterization secondary to the syncope and  VT and this demonstrated severe LV dysfunction with an EF of 15%.  He had no  obstructive coronary disease noted.  His LVEDP was elevated.  The patient  has improved with treatment including diuresis.  Very minimal beta-blocker  initiation demonstrated severe bradycardia with pauses of up to 5 seconds.  He also has nonsustained VT and a very rapid ventricular rates.  He is now  referred for additional evaluation and treatment.   PAST MEDICAL HISTORY:  Notable for a diagnosis of a nonischemic  cardiomyopathy back in 1997.  At that time his EF was 30%.  He continues to  work on a regular basis and his heart failure symptoms are presently class  I.  His past medical history is as previously noted.  In addition he has a  history of hypertension.   FAMILY HISTORY:  Notable for mother dying at age 72 of cancer and father  dying in his 72s of cancer and myocardial infarction.   REVIEW OF SYSTEMS:  He denies fevers, chills, or night sweats.   He denies  vision or hearing problems.  He denies any photophobia.  He does complain  with rare headache spells.  SKIN:  Revealed no rashes or lesions.  CARDIOPULMONARY:  Shortness of breath with exertion but no dyspnea or  orthopnea.  He does not have claudication or PND.   SOCIAL HISTORY:  The patient is married and lives in [place].  He denies  tobacco or ethanol use.   PHYSICAL EXAMINATION:  GENERAL:  He is a pleasant, well-appearing, 70-year-  old man who looks somewhat older than his stated age.  VITAL SIGNS:  Blood pressure 118/56, pulse was 56 and regular, respirations  were 18.  HEENT:  Normocephalic, atraumatic.  The pupils are equal and round.  The  oropharynx was moist and sclerae were anicteric.  NECK:  No  jugular venous distention.  The carotids were 2+ and symmetric.  The trachea was midline and there was no obvious thyromegaly.  LUNGS:  Clear bilaterally to auscultation.  CARDIOVASCULAR:  Regular rate and rhythm with a normal S1 & S2.  There are  no murmurs, rubs, or gallops appreciated.  ABDOMEN:  Soft, nontender, nondistended.  There is no organomegaly.  EXTREMITIES:  Demonstrated no clubbing, cyanosis, or edema.  NEUROLOGICAL:  Grossly nonfocal.   LABORATORY DATA:  EKG demonstrates normal sinus rhythm with a QRS duration  of 110 msec.   IMPRESSION:  1. Dilated cardiomyopathy, ejection fraction of 15%.  2. Non obstructing coronary disease.  3. Syncope.  4. Status post cerebrovascular accident with minimal residual or neurologic     deficit.  5. Nonsustained ventricular tachycardia.  6. Bradycardia with pauses of up to 5 seconds on initiation of beta-blocker     therapy.   DISCUSSION:  The patient has a nonischemic cardiomyopathy with severe LV  dysfunction.  His CHF symptoms are class I.  His syncope in conjunction with  the cardiomyopathy and nonsustained VT are concerning.  This would  constitute a class II and be indication for an ICD.  However, the  patient is  not particularly excited about a defibrillator unless he absolutely has to  have one and I have recommended an invasive EP study.  If negative, then a  pacemaker with a combination beta-blocker and ACE inhibitor therapy with  good electrograms would be reasonable in the absence of inducible VT.  If he  has inducible VT or VF then I would recommend ICD insertion with a dual-  chamber concomitant pacemaker so that the patient may be atrial paced and  ventricular sensed to help with his backup bradycardia pacing without  causing ventricular dissociation from RV apical pacing.  I have discussed  these issues with the patient and he would like to proceed.                                               Doylene Canning. Ladona Ridgel, M.D.    GWT/MEDQ  D:  02/06/2003  T:  02/06/2003  Job:  [id number]   cc:   Madaline Savage, M.D.  [address]  [place]  [place] [address]  Fax: [phone]   Thereasa Solo. Little, M.D.  [address]  [place] [address]  Fax: [phone]   Marolyn Hammock. Thad Ranger, M.D.  [address]  [address]  [place]  [place] [address]  Fax: [phone]

## 2010-09-06 NOTE — H&P (Signed)
NAME:  Nathan Mckinney, Nathan Mckinney NO.:  [id number]   MEDICAL RECORD NO.:  [id number]                   PATIENT TYPE:  EMS   LOCATION:  MAJO                                 FACILITY:  [facility]   PHYSICIAN:  Michael L. Thad Ranger, M.D.           DATE OF BIRTH:  1940/05/23   DATE OF ADMISSION:  01/30/2003  DATE OF DISCHARGE:                                HISTORY & PHYSICAL   CHIEF COMPLAINT:  Passed out and right-sided weakness.   HISTORY OF PRESENT ILLNESS:  This is the initial [facility]  assessment on admission for this 70 year old male with a past medical  history including hypertension.  The patient worked the 3rd shift earlier  this evening and came home this morning.  He was sitting in a chair when he  said his right side felt funny, the sensation involved the leg, arm,  trunk, and possibly the face.  He did fall out of his chair and believes he  might have lost consciousness briefly.  Upon realizing he was on the floor,  he felt weak on the right side.  He said it occurred about 7:30 this  morning.  EMS was alerted to his home and the patient was brought to the  emergency room where he says his symptoms are better.  He does feel his  strength is returning completely to normal although his right side still  feels a little bit funny.  He denies any previous history of similar  symptoms and denies any associated chest pain, palpitations or shortness of  breath.  He does have a mild global headache.   During evaluation in the emergency room, the patient had a spell of altered  consciousness with jerking of his extremities during which he seemed to  respond to stimuli.  This was not clearly convulsive and his wife says that  he has had such events before when he is very tired.   PAST MEDICAL HISTORY:  Remarkable for hypertension under control.  He  reports a history of left ventricular hypertrophy but no symptoms suggestive  of coronary artery disease.   He has also had spells as above.   FAMILY HISTORY:  Remarkable for stroke.   SOCIAL HISTORY:  He denies using tobacco.  He lives with his wife.  He  performs normal, independent activities of daily living.  He is a former  marine.   ALLERGIES:  PENICILLIN.   MEDICATIONS:  1. Lotrel, uncertain dose.  2. Occasional aspirin.   REVIEW OF SYSTEMS:  He reports fairly frequent headaches, especially when he  is at work, he describes these as frontal and fairly severe, although not  associated with nausea and not severe enough to make him leave work.  Full  review of systems is otherwise negative except for HPI and admission ER  nursing records.   PHYSICAL EXAMINATION:  VITAL SIGNS:  Temperature 97.6, blood pressure  168/91, pulse 68,  respirations 22, O2 saturation 97% on two liters nasal  cannula.  GENERAL:  He is alert, relatively healthy-appearing and in no evident  distress.  HEENT:  Normocephalic, atraumatic.  Oropharynx benign.  NECK:  Supple, without carotid or supraclavicular bruits.  HEART:  Occasional premature beats, no murmurs.  CHEST:  Clear to auscultation.  ABDOMEN:  Soft, normoactive bowel sounds.  EXTREMITIES:  2+ pulses, no edema.  NEUROLOGIC:  Mental status:  He appears awake and alert.  He is oriented to  time, place, and person.  Speech is fluent and not dysarthric.  He can name  objects and has a lot of difficulty reading.  Mood is euthymic and affect  appropriate.  Cranial nerves:  Pupils equal and reactive.  Extraocular  movements are full without nystagmus.  Examination of the visual fields  reveals a fairly dense right hemianopsia.  Facial sensation is diminished to  pinprick of the right versus the left.  Face, tongue and palate all move  normally and symmetrically.  Motor:  Normal bulk and tone, normal strength  in all tested extremities.  Sensation:  Diminished pinprick sensation in the  right and right lower extremity as compared to the left.   Cerebellar:  Finger-to-nose and heel-to-shin are performed adequately.  Gait exam is  deferred.  Reflexes are 2+ and symmetric.  Toes are downgoing.   LABORATORY DATA:  CBC and coags are normal.  CMET is pending at the time of  dictation.  CT of the head is personally reviewed and demonstrates old  bilateral subcortical hypointensities in the deep white matter as well as  mild small vessel disease, but no acute findings.   IMPRESSION:  1. 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  2. 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   PLAN:  We will admit for routine stroke workup, including MRI, MRA of the  head and neck, echocardiogram, carotid transcranial Dopplers and stroke labs  as well as telemetry monitoring.  We will get an EEG for evaluation.  Stroke  service will follow.                                                Michael L. Thad Ranger, M.D.    MLR/MEDQ  D:  01/30/2003  T:  01/30/2003  Job:  [id number]

## 2010-09-06 NOTE — Op Note (Signed)
NAME:  DORTHY, HUSTEAD NO.:  [id number]   MEDICAL RECORD NO.:  [id number]                   PATIENT TYPE:  INP   LOCATION:  3103                                 FACILITY:  [facility]   PHYSICIAN:  Doylene Canning. Ladona Ridgel, M.D.               DATE OF BIRTH:  July 14, 1940   DATE OF PROCEDURE:  02/07/2003  DATE OF DISCHARGE:                                 OPERATIVE REPORT   PROCEDURE PERFORMED:  Invasive electrophysiologic study with insertion of a  dual chamber implantable cardiac defibrillator.   INDICATIONS FOR PROCEDURE:  History of nonischemic cardiomyopathy with an  ejection fraction of 15%, syncope and nonsustained ventricular tachycardia  with inducible ventricular tachycardia and ventricular fibrillation at EP  study with double extra stimuli.   INTRODUCTION:  The patient is a very pleasant 70 year old man with a known  cardiomyopathy. He has had a history of syncope and a recent stroke. He has  an ejection fraction of 15% with no obstruction  coronary disease. He has  nonsustained ventricular tachycardia. He had a recent syncopal spell and was  admitted to the hospital. He is now referred for invasive electrophysiologic  study, and positive implantable cardiac defibrillator insertion, if negative  permanent pacemaker insertion.   DESCRIPTION OF PROCEDURE:  After informed consent was obtained the patient  was taken to the diagnostic electrophysiology laboratory in a fasting state.  After the usual prepping and draping intravenous Fentanyl and midazolam was  given for sedation.   After measurement of the basic intervals, rapid ventricular pacing was  carried out from the RV apex with a pacing cycle length of 490 milliseconds  and step-wise decreased  down to 270 milliseconds where VA Wenckebach was  observed. During rapid ventricular pacing, there was no inducible  ventricular tachycardia.   Next programmed ventricular stimulation was carried out from the  RV apex at  a base stress cycle length of 600 and 400 milliseconds. S1-S2, S1-S2-S3, and  S1-S2-S3-S4 stimuli were delivered with the S1-S2, S2-S3 and S3-S4 intervals  stepwise decreased  down to ventricular fractions. During programmed  ventricular stimulation in an S1-S2-S3-S4 cupping interval at  400/600/230/200 there was inducible VT. This was in fact ventricular  flutter. It was hemodynamically unstable and it was cardioverted with 200  joules of synchronized energy.   At this point the catheters were removed. Hemostasis was assured and the  patient was prepped for ICD insertion.   After the usual prepping and draping a total of 30 mL of Lidocaine was  infiltrated into the left infraclavicular region. A 9-cm incision was  carried out over this region and electrocautery was utilized to dissect down  to the fascial plane. The left subclavian vein was  punctured x2 and the  Medtronic model 6949, 65-cm active fixation  defibrillation lead, serial  number [id number] was advanced into the right ventricle and the Medtronic  model 5076, 52-cm active fixation  pacing lead, serial number [id number] was  advanced in the right atrium. Mapping was carried out in the right ventricle  and at the final site in the RV apex, the R-waves measured 15 millivolts and  the pacing threshold was 0.4 volts at 0.5 milliseconds with a pacing  impedance once the lead was actively fixed at 847 ohms; 10 volt pacing did  not stimulate the diaphragm.   Attention was turned then to the atrial lead and it was placed in the  lateral  wall of the right atrium where P-waves measured 2.9 millivolts and  the atrial threshold was 0.8 volts at 0.5 with a pacing impedance of 920  ohms once the lead was actively fixed. Again 10 volt pacing did not  stimulate the diaphragm. With both atrial and ventricular leads in  satisfactory position, they were secured to the subpectoralis fascia with a  figure-of-8 silk suture. In  addition the sewing sleeve was secured with silk  suture.   Electrocautery was utilized to make a pocket and kanamycin irrigation was  utilized to irrigate the pocket. Electrocautery was utilized to assure  hemostasis.   At this point the Medtronic Maximo DR model 7278 dual chamber  defibrillation was connected to the atrial and  defibrillation leads and  placed in the subcutaneous pocket. The generator was  secured with a silk  suture.  Defibrillation threshold testing was then carried out.   After the usual sedation with additional intravenous Fentanyl and midazolam,  VF was induced with a T-wave shock. A 15-joule shock terminated ventricular  fibrillation, restoring sinus rhythm. Five minutes was allowed to elapse and  a 2nd DFT test was carried out. Again VF was induced with a T-wave shock and  a 15 joule shock terminated ventricular fibrillation restoring sinus rhythm.  The shocking impedances were 39 and 40 ohms respectively.   At this point on additional  defibrillation threshold testing was carried  out and the incision was closed with a layer of 2-0 Vicryl followed  by a  layer of 3-0 Vicryl followed  by a layer of 4-0 Vicryl. Benzoin was painted  on the skin, Steri-Strips were applied. A pressure dressing was placed and  the patient returned to his room in satisfactory condition.   COMPLICATIONS:  There were no immediate procedure complications.   RESULTS:  This demonstrated successful insertion of a Medtronic dual chamber  defibrillator in a patient with inducible VT at EP testing in the setting of  a nonischemic cardiomyopathy, syncope with an ejection fraction of 15%.                                               Doylene Canning. Ladona Ridgel, M.D.    GWT/MEDQ  D:  02/07/2003  T:  02/07/2003  Job:  [id number]   cc:   Madaline Savage, M.D.  [address], [address]  [place]  [place] [address]  Fax: [phone]   Marolyn Hammock. Thad Ranger, M.D. [address]  [address]  [place]  [place]  [address]  Fax: [phone]   Kathrine Cords, R.N. [facility]

## 2010-09-06 NOTE — Discharge Summary (Signed)
NAME:  Nathan Mckinney, Nathan Mckinney NO.:  [id number]   MEDICAL RECORD NO.:  [id number]                   PATIENT TYPE:  INP   LOCATION:  3025                                 FACILITY:  [facility]   PHYSICIAN:  Casimiro Needle L. Thad Ranger, M.D.           DATE OF BIRTH:  03-May-1940   DATE OF ADMISSION:  01/30/2003  DATE OF DISCHARGE:  02/12/2003                                 DISCHARGE SUMMARY   ADMISSION DIAGNOSES:  1. 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  2. 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   DISCHARGE DIAGNOSES:  1. 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  2. Spells, possibly secondary to seizures.  No further during     hospitalization.  3. Nonsustained ventricular tachycardia with ejection fraction of about 10-     15%, status post implantable cardioverter defibrillator placement on     February 07, 2003.  4. Hypertension, mild, well controlled.  5. Hyperhomocysteinemia, mild.  6. Dilated cardiomyopathy, ejection fraction approximately 15% by     catheterization as above.  7. Chronic anticoagulation.   CONDITION ON DISCHARGE:  Improved.   DIET:  Low salt, low fat, low cholesterol.   ACTIVITY:  Ad lib except no driving or return to work until followup with  Dr. Ladona Ridgel in 10-14 days.   MEDICATIONS ON DISCHARGE:  1. Coumadin 5 mg daily or as directed (new).  2. Lasix 20 mg p.o. daily (new).  3. Coreg 6.25 mg b.i.d. (new).  4. Digoxin 0.125 mg daily (new).  5. Foltx one daily. (new).  6. Baby aspirin 81 mg daily (new dose).  7. Lotrel once a day as before.   PROCEDURE:  1. Cardiac catheterization February 03, 2003 by Dr. Jenne Campus demonstrating no     significant coronary artery disease, severely depressed systolic     function, EF of  16-10% with global hypokinesis and elevated LVEDP.  2. Cardiac electrophysiologic study with subsequent placement of dual pacer     implantable cardioverter defibrillator, February 07, 2003 by Dr. Ladona Ridgel.   STUDIES:  1. CT scan of the head performed in the emergency room January 30, 2003 does     read mild atrophies, small vessel disease, old lacunars, no acute     finding.  2. MRI of the brain performed January 30, 2003, demonstrating an acute     infarction in the left hippocampal gyrus, mild atrophy and chronic     changes.  3. MRA of the intracranial circulation demonstrating moderate stenosis of     the left posterior cervical artery; otherwise unremarkable.  4. MRA of the extracranial neck vessels without contrast, negative.  5. Carotid dopplers January 31, 2003, no evidence of hemodynamically     significant stenosis,  antegrade vertebral flow.  6. Transcranial Dopplers January 31, 2003, normal flow velocities in the     anterior and posterior circulations.  7. Echocardiogram performed January 31, 2003, demonstrating mild to moderate     dilation of the left ventricle, left ventricular function 30-40% with     moderate diffuse left ventricular hypokinesis.  8. Serial EKGs, October 11, October 14, October 16, October 20 demonstrating     nonspecific T wave changes but no acute injury occurrence and normal     sinus rhythm.  9. Telemetry monitoring October 11 to October 21, demonstrating two episodes     of nonsustained ventricular tachycardia of eight beats in the early     morning hours of October 13.  10.      Routine EEG, January 30, 2003, demonstrating excessive beta     activity but otherwise normal with no focal abnormalities and no     epileptiform discharges.   HOSPITAL COURSE:  Please see admission H&P for full admission details.  Briefly, this is a 70 year old male with little past medical history other  than hypertension and mild left ventricular systolic dysfunction.   He was  admitted on January 30, 2003 after suffering an episode of possible syncope  at home with subsequent right-sided weakness.  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  He  was felt to have an acute stroke and since then admitted to the stroke  service to undergo routine stroke work up.  He did undergo EEG on October 11  which was negative as above.  Routine stroke work up was undertaken with  results as above.  Physical, occupational and speech therapy was consulted  and assisted in the patient's rehabilitation.  In the early morning hours of  February 01, 2003, the patient had an episode of nonsustained ventricular  tachycardia of approximately eight beats.  Electrolytes at that time were  unremarkable.  He was seen by his primary cardiology group.  He did have  history of previously negative cardiac catheterization but this was  scheduled to be undertaken again due to the new arrhythmia.  On the evening  of October 13, he complained of pain in the right abdomen and leg and leg  and some new left arm pain and numbness, the etiology of which was unclear.  EKG was unchanged at that time.  Because of the arm and chest pain, he was  placed on heparin.  On the morning of October 14, he became diaphoretic and  weak with hypoxia, O2 saturation 88% and bradycardia into a rate of 39, and  subsequently complained of increased weakness on the right side.  Urgency to  the head did not demonstrate any extension of his stroke.  He was moved to  the stepdown unit for closer monitoring and was treated with intravenous  fluids.  Cardiac catheterization at that time were negative.  CT scan of the head demonstrated simply the evolving left temporal stroke.  He felt better  on February 03, 2003 and was able to  undergo cardiac catheterization.  The  results were essentially unremarkable as noted above.  He was continued on  heparin.  Because of his arrhythmias, EEP service was consulted and he was  seen by Dr. Ladona Ridgel.  On October 19, he underwent a cardiac  EP study as noted  above at which time easily inducible ventricular tachycardia was found and  because of this and his poor ejection fraction, a dual-chamber ICD was  placed without incident.  Because of the ejection fraction, his stroke was  felt most likely cardioembolic and Coumadin was started for long-term  anticoagulation for stroke prophylaxis.  The patient subsequently did very  well and remained neurologic stable throughout his hospitalization.  Indeed  by the time he was discharged, he really had no significant neurologic  deficits.  He was placed on Coreg in titrating doses after his ICD placement  and tolerated this well.   Arrangements for physical and outpatient therapy were made at discharge as  recommended by the inpatient therapist.  By the time of October 24, he had  an essentially normal neurologic examination and really no complaints.  He  received 7.5 mg of Coumadin on October 20 and October 21 and 10 mg on  October 22 and October 23 with INR of 1 on October 21, 1.1 on October 22,  1.4 on the 23, and 2.1 on October 24.  He was subsequently placed on 5 mg of  Coumadin.  He was cleared on October 23 neurologically for discharge on  October 24.   Final discharge orders are pending approval from his cardiology service.   DISCHARGE INSTRUCTIONS AND FOLLOWUP:  The patient was given a prescription  for prothrombin time with INR check to be performed on February 15, 2003 at  Dr. Truett Perna office and he can his has INR followed up there.  He was  advised to call [phone] for an appointment with Dr. Pearlean Brownie in two months and  was advised to take his prescription to at an appointment at his outpatient  occupational therapy [place].  He is also being set up in the pacer  clinic and was advised to see Dr. Ladona Ridgel in 10-14 days for followup of his  pacer placement.  He was also asked to follow up with his primary care  physician in approximately four weeks.                                                 Michael L. Thad Ranger, M.D.    MLR/MEDQ  D:  02/12/2003  T:  02/12/2003  Job:  [id number]   cc:   Madaline Savage, M.D.  [address]  [place]  [place] [address]  Fax: [phone]   Delman Cheadle, MD  [address]  [place]  [place] [address]  Fax: [phone]   Doylene Canning. Ladona Ridgel, M.D.

## 2010-09-06 NOTE — Cardiovascular Report (Signed)
NAME:  Nathan Mckinney, Nathan Mckinney NO.:  [id number]   MEDICAL RECORD NO.:  [id number]                   PATIENT TYPE:  INP   LOCATION:  3103                                 FACILITY:  [facility]   PHYSICIAN:  Darlin Priestly, M.D.             DATE OF BIRTH:  1941-01-20   DATE OF PROCEDURE:  02/03/2003  DATE OF DISCHARGE:                              CARDIAC CATHETERIZATION   PROCEDURE PERFORMED:  Cardiac catheterization.   CARDIOLOGIST:  Darlin Priestly, M.D.   INDICATIONS:  Mr. Kasler is a 70 year old male, patient of Dr. Lavonne Chick  with history of moderately depressed EF of approximately 30% with remote  cardiac catheterization in 1997 with no significant CAD.  He was admitted on  January 30, 2003 with syncope and right-sided weakness.  Subsequent MRI  confirmed left posterior CVA.  He also nonsustained VT.  He is now referred  cardiac catheterization to rule out significant CAD as a possible cause of  his nonsustained VT.   DESCRIPTION OF PROCEDURE:  After getting signed informed written consent the  patient was brought to the cardiac cath lab.  The right and left groins were  shaped, prepped and draped in the usual sterile fashion.  ECG monitoring was  established.  Using the modified Seldinger technique a #6 French arterial  sheath was inserted into the right femoral artery.  Six French diagnostic  catheters were used to perform diagnostic angiography.  This revealed a  large left main with no significant disease.   The LAD is a large vessel, which coursed around the apex to two diagonal  branches.  The LAD has no significant disease.  The first diagonal is a  large vessel, which originates high on the la with no significant disease.  The second diagonal is a large vessel, which bifurcates distally.  There is  50% ostial and 50% midvessel stenosis.   The left coronary artery also gives rise to a large ramus intermedius, which  bifurcates at its distal  portion and has no significant disease.   The left circumflex is a large vessel coursing the AV groove and goes to two  obtuse marginal branches.  The AV groove circ has no significant disease.  The first OM is a large vessel that bifurcates distally and has no  significant disease.  The second OM is a small vessel with no significant  disease.   The right coronary artery is a large vessel, which is dominant, and gives  rise to both the PDA as well as the posterolateral branch.  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  There is 40% proximal and 40 early  midvessel stenoses.  The PDA and posterolateral branch have no significant  disease.   Left ventriculogram reveals severely dilated left ventricle with an EF of  approximately 15-20% with global hypokinesis.   Abdominal aortogram reveals no evidence of significant renal  artery  stenosis.   HEMODYNAMIC DATA:  Systemic arterial pressure 157/91.  LV systemic pressure  162/15.  LVEDP of 33.   CONCLUSION:  1. No significant coronary artery disease.  2. Severely depressed left ventricular systolic function.  3. No evidence of renal artery stenosis.  4. Elevated left ventricular end diastolic pressure.                                                 Darlin Priestly, M.D.    RHM/MEDQ  D:  02/03/2003  T:  02/03/2003  Job:  [id number]   cc:   Madaline Savage, M.D.  [address]  [place]  [place] [address]  Fax: [phone]   Thereasa Solo. Little, M.D.  [address]  [place] [address]  Fax: [phone]   Marolyn Hammock. Thad Ranger, M.D.  [address]  [place]  [place] [address]  Fax: [phone]

## 2010-09-26 ENCOUNTER — Ambulatory Visit (INDEPENDENT_AMBULATORY_CARE_PROVIDER_SITE_OTHER): Payer: Medicare Other | Admitting: Family Medicine

## 2010-09-26 ENCOUNTER — Encounter: Payer: Self-pay | Admitting: Family Medicine

## 2010-09-26 VITALS — BP 116/78 | HR 60 | Ht 71.0 in | Wt 184.0 lb

## 2010-09-26 DIAGNOSIS — B354 Tinea corporis: Secondary | ICD-10-CM

## 2010-09-26 MED ORDER — TERBINAFINE HCL 250 MG PO TABS: 250.0000 mg | ORAL_TABLET | Freq: Every day | ORAL | Status: DC

## 2010-09-26 NOTE — Progress Notes (Signed)
  Subjective:    Patient ID: Nathan Mckinney, male    DOB: [birth year], 70 y.o.   MRN: [id number]  HPI he complains of a several month history of a rash on the back of his head that has gotten larger and become slightly pruritic. He also was noted on the side of his neck.    Review of Systems     Objective:   Physical Exam 6-8 cm dry scaly rash with erythematous margins is noted. KOH was positive.        Assessment & Plan:  Tinea corporis. I will treat him with Lamisil. He is to return here in one month for recheck.

## 2010-09-26 NOTE — Patient Instructions (Signed)
Use that medicine every day and return here in a month when he looks at your neck again

## 2010-10-03 ENCOUNTER — Encounter: Payer: Self-pay | Admitting: Family Medicine

## 2010-10-24 ENCOUNTER — Encounter: Payer: Self-pay | Admitting: Internal Medicine

## 2010-11-12 ENCOUNTER — Other Ambulatory Visit: Payer: Self-pay | Admitting: Cardiovascular Disease

## 2010-11-12 DIAGNOSIS — M79601 Pain in right arm: Secondary | ICD-10-CM

## 2010-11-13 ENCOUNTER — Ambulatory Visit
Admission: RE | Admit: 2010-11-13 | Discharge: 2010-11-13 | Disposition: A | Discharge status: Home / Self Care | Payer: Medicare Other | Source: Ambulatory Visit | Attending: Cardiovascular Disease | Admitting: Cardiovascular Disease

## 2010-11-13 DIAGNOSIS — M79601 Pain in right arm: Secondary | ICD-10-CM

## 2011-01-17 ENCOUNTER — Other Ambulatory Visit: Payer: Self-pay | Admitting: Family Medicine

## 2011-02-10 ENCOUNTER — Encounter: Payer: Self-pay | Admitting: Family Medicine

## 2011-02-27 ENCOUNTER — Ambulatory Visit
Admission: RE | Admit: 2011-02-27 | Discharge: 2011-02-27 | Disposition: A | Discharge status: Home / Self Care | Payer: Medicare Other | Source: Ambulatory Visit | Attending: Medical | Admitting: Medical

## 2011-02-27 ENCOUNTER — Encounter: Payer: Self-pay | Admitting: Medical

## 2011-02-27 ENCOUNTER — Ambulatory Visit (INDEPENDENT_AMBULATORY_CARE_PROVIDER_SITE_OTHER): Payer: Medicare Other | Admitting: Medical

## 2011-02-27 DIAGNOSIS — Z23 Encounter for immunization: Secondary | ICD-10-CM

## 2011-02-27 DIAGNOSIS — R079 Chest pain, unspecified: Secondary | ICD-10-CM

## 2011-02-27 DIAGNOSIS — M549 Dorsalgia, unspecified: Secondary | ICD-10-CM

## 2011-02-27 DIAGNOSIS — R10819 Abdominal tenderness, unspecified site: Secondary | ICD-10-CM

## 2011-02-27 LAB — CBC WITH DIFFERENTIAL/PLATELET
Basophils Absolute: 0 10*3/uL (ref 0.0–0.1)
Eosinophils Relative: 3 % (ref 0–5)
HCT: 45.1 % (ref 39.0–52.0)
Lymphocytes Relative: 18 % (ref 12–46)
MCHC: 32.6 g/dL (ref 30.0–36.0)
MCV: 90.2 fL (ref 78.0–100.0)
Monocytes Absolute: 0.5 10*3/uL (ref 0.1–1.0)
RDW: 13.9 % (ref 11.5–15.5)
WBC: 6.2 10*3/uL (ref 4.0–10.5)

## 2011-02-27 LAB — POCT URINALYSIS DIPSTICK
Bilirubin, UA: NEGATIVE
Blood, UA: NEGATIVE
Glucose, UA: NEGATIVE
Ketones, UA: NEGATIVE
Spec Grav, UA: 1.015
Urobilinogen, UA: NEGATIVE

## 2011-02-27 LAB — COMPREHENSIVE METABOLIC PANEL
ALT: 11 U/L (ref 0–53)
AST: 20 U/L (ref 0–37)
Creat: 1.55 mg/dL — ABNORMAL HIGH (ref 0.50–1.35)
Total Bilirubin: 1.6 mg/dL — ABNORMAL HIGH (ref 0.3–1.2)

## 2011-02-27 NOTE — Progress Notes (Signed)
Subjective:   HPI  Nathan Mckinney is a 70 y.o. male who presents for pain in right chest for a few weeks.  He reports some dull pain routinely, but worse if lying down or lying on the right side.   He sleeps on the right side since he has a pacemaker on the left side.  Denies any recent trauma or injury, but has been doing some lifting.  He is remodeling a bathroom and carried a heavy stool outside and had some chest and back pain afterwards.  He has continued to do some remodeling since.   Pain is characterized as "aches."  Pain is worse with lying down.  Turning on the other side helps some.   Using some Advil this morning with some improvement.  No other aggravating or relieving factors.    No other c/o.  The following portions of the patient's history were reviewed and updated as appropriate: allergies, current medications, past family history, past medical history, past social history, past surgical history and problem list.  Past Medical History  Diagnosis Date  . Hypertension   . Chronic tension headaches   . Tinea cruris   . Tinea unguium   . Hernia, hiatal   . CVA (cerebral infarction)   . Cardiomyopathy   . Bradycardia   . Ventricular tachycardia   . Syncope   . CAD (coronary artery disease)   . Somnolence   . Sleep disorder   . Atrial fibrillation   . Automatic implantable cardiac defibrillator in situ   . Dyspnea     on exertion   Review of Systems Constitutional: -fever, -chills, -sweats, -unexpected -weight change,-fatigue ENT: -runny nose, -ear pain, -sore throat Cardiology:  +chest pain, -palpitations, -edema Respiratory: +mild cough occasionally, -shortness of breath, -wheezing Gastroenterology: -abdominal pain, -nausea, -vomiting, -diarrhea, -constipation Hematology: -bleeding or bruising problems Musculoskeletal: -arthralgias, -myalgias, -joint swelling, +back pain Ophthalmology: -vision changes Urology: -dysuria, -difficulty urinating, -hematuria, -urinary  frequency, -urgency Neurology: -headache, -weakness, -tingling, -numbness    Objective:   Physical Exam  Filed Vitals:   02/27/11 1435  BP: 110/70  Pulse: 62  Temp: 97.5 F (36.4 C)  Resp: 16    General appearance: alert, no distress, WD/WN, white male Skin: right lower chest wall and left lateral abdominal wall both with 1-1.5 cm roundish subcutaneous lump that could represent cyst or other nodule, nontender, not particularly mobile, but no drainage  Oral cavity: MMM, no lesions Neck: supple, no lymphadenopathy, no thyromegaly, no masses Heart: RRR, normal S1, S2, no murmurs Lungs: CTA bilaterally, no wheezes, rhonchi, or rales Chest wall: mild generalized tenderness of right posterior and lateral mid to lower chest wall, no point tenderness Back: mild right CVA tenderness Abdomen: +bs, soft, mild tenderness in LLQ, RLQ, RUQ, and somewhat suprapubic region, but non distended, no masses, no hepatomegaly, no splenomegaly Pulses: 2+ symmetric, upper and lower extremities, normal cap refill   Assessment and Plan :    Encounter Diagnoses  Name Primary?  . Chest pain Yes  . Abdominal tenderness   . Back pain   . Need for prophylactic vaccination and inoculation against influenza    Flu shot given.  Labs and CXR ordered.  Etiology unclear.  Likely musculoskeletal given the recent activity that could have caused some soreness, but can't rule out other chest, lung, or abdominal process.  Less likely cardiac related although he has significant hx/o cardiovascular disease and Afib.    Follow-up pending labs.

## 2011-02-27 NOTE — Patient Instructions (Addendum)
Try some OTC Tylenol extra strength daily, rest, no heavy lifting.  We will call with xray and lab results.

## 2011-03-06 ENCOUNTER — Ambulatory Visit: Payer: Medicare Other | Admitting: Medical

## 2011-03-10 ENCOUNTER — Other Ambulatory Visit: Payer: Self-pay | Admitting: Family Medicine

## 2011-03-10 NOTE — Telephone Encounter (Signed)
Is this okay?

## 2011-04-05 ENCOUNTER — Other Ambulatory Visit: Payer: Self-pay | Admitting: Family Medicine

## 2011-05-27 ENCOUNTER — Other Ambulatory Visit: Payer: Self-pay | Admitting: Family Medicine

## 2011-05-27 NOTE — Telephone Encounter (Signed)
Are these ok 

## 2011-06-19 ENCOUNTER — Other Ambulatory Visit: Payer: Self-pay | Admitting: Family Medicine

## 2011-08-18 ENCOUNTER — Encounter: Payer: Self-pay | Admitting: Cardiovascular Disease

## 2011-08-29 ENCOUNTER — Encounter: Payer: Self-pay | Admitting: Family Medicine

## 2011-08-29 NOTE — Progress Notes (Signed)
Nathan Mckinney was here with his wife today- she fell and hurt her side and he was along for support.  However, during her visit he noted that he felt very weak and that his "vision went out."  At that time it was about 1:00 pm and he had not eaten anything all day.  He was given juice and crackers, and noted to have VS ofT 97.5/ P70/ R18/ BP 133/69, O2 sat 98% Vision with correction Right 20/30, left 20/30, both 20/25  Nathan Mckinney did not have any arm drift or facial weakness. He did not have any slurred speech or numbness. He had a normal neurological exam after he ate his snack, and noted that his vision returned to almost completely normal.  He has a pacmaker and a. Fib, but showed a normal rate and rhythm at this time.    In the end Nathan Mckinney declined my offer to check him in and see him formally. He felt that his symptoms must have been due to low blood sugar.  However, he did agree to come back or seek emergency care if his symptoms returned.  His wife planned to drive them home, and I encouraged him to eat lunch when he arrived

## 2011-10-30 ENCOUNTER — Other Ambulatory Visit: Payer: Self-pay | Admitting: Family Medicine

## 2012-01-02 ENCOUNTER — Telehealth: Payer: Self-pay | Admitting: Family Medicine

## 2012-01-02 NOTE — Telephone Encounter (Signed)
RECORDS FAXED.

## 2012-01-27 ENCOUNTER — Telehealth: Payer: Self-pay | Admitting: Family Medicine

## 2012-01-27 ENCOUNTER — Encounter: Payer: Self-pay | Admitting: Internal Medicine

## 2012-01-27 NOTE — Telephone Encounter (Signed)
Pt's wife called and stated that the [place] is going to start paying for all of his meds. We have already sent them a list of his meds and now they want a list of all meds and why they were prescribed. [facility] with [facility] are [facility]. List can be faxed to [facility] at [phone] attn Dr. Ellin Mayhew. Please call wife and inform when this has been done.

## 2012-01-28 NOTE — Telephone Encounter (Signed)
Please advise why each med has been prescribed for the patient.

## 2012-01-29 NOTE — Telephone Encounter (Signed)
Per jcl pt will have to come in and appt has been made

## 2012-01-30 ENCOUNTER — Ambulatory Visit (INDEPENDENT_AMBULATORY_CARE_PROVIDER_SITE_OTHER): Payer: Medicare Other | Admitting: Family Medicine

## 2012-01-30 ENCOUNTER — Encounter: Payer: Self-pay | Admitting: Family Medicine

## 2012-01-30 VITALS — BP 114/70 | HR 80 | Wt 178.0 lb

## 2012-01-30 DIAGNOSIS — F0280 Dementia in other diseases classified elsewhere without behavioral disturbance: Secondary | ICD-10-CM | POA: Insufficient documentation

## 2012-01-30 DIAGNOSIS — E785 Hyperlipidemia, unspecified: Secondary | ICD-10-CM

## 2012-01-30 DIAGNOSIS — M109 Gout, unspecified: Secondary | ICD-10-CM | POA: Insufficient documentation

## 2012-01-30 DIAGNOSIS — I428 Other cardiomyopathies: Secondary | ICD-10-CM

## 2012-01-30 DIAGNOSIS — I6350 Cerebral infarction due to unspecified occlusion or stenosis of unspecified cerebral artery: Secondary | ICD-10-CM

## 2012-01-30 DIAGNOSIS — I4891 Unspecified atrial fibrillation: Secondary | ICD-10-CM

## 2012-01-30 HISTORY — DX: Dementia in other diseases classified elsewhere, unspecified severity, without behavioral disturbance, psychotic disturbance, mood disturbance, and anxiety: F02.80

## 2012-01-30 NOTE — Progress Notes (Signed)
  Subjective:    Patient ID: Nathan Mckinney, male    DOB: Sep 05, 1940, 71 y.o.   MRN: [id number]  HPI He is here for an interval evaluation. He will be getting some of his care through the [place]. They did not cover all of his medications. He has a previous history of CVA and developed dementia after that. He has been on Namenda and Aricept however there is a question whether it still doing him any good. His wife thinks it is not. He is on multiple cardiac medications. He has had his Coumadin stopped. He was seen apparently within the last several months by cardiology . He continues on Zocor and is having no difficulty with this. He did have a recent gout attack and did treat it conservatively. He has a previous history of gout dating back to 2005. Presently he is taking 100 mg allopurinol.  Review of Systems     Objective:   Physical Exam Alert and in no distress. Cardiac exam shows regular rhythm without murmurs or gallops ,lungs are clear to auscultation.       Assessment & Plan:   1. CVA    2. CARDIOMYOPATHY, DILATED    3. Atrial fibrillation    4. 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 Gout  Uric Acid  6. Hyperlipidemia LDL goal < 70       I will check routine blood work. I will also stop the Aricept and Namenda. They're to let me know whether the memory issues reoccur.

## 2012-01-30 NOTE — Patient Instructions (Addendum)
Stop the  Namenda and donepezil. If you start to have memory problems again, we can start these up.

## 2012-01-31 LAB — LIPID PANEL
Cholesterol: 183 mg/dL (ref 0–200)
HDL: 38 mg/dL — ABNORMAL LOW (ref >39)
Total CHOL/HDL Ratio: 4.8 Ratio
Triglycerides: 201 mg/dL — ABNORMAL HIGH (ref <150)
VLDL: 40 mg/dL (ref 0–40)

## 2012-02-02 ENCOUNTER — Other Ambulatory Visit: Payer: Self-pay

## 2012-02-02 NOTE — Progress Notes (Signed)
Quick Note:  Notified wife of change and meds faxed to [place] ______

## 2012-02-04 ENCOUNTER — Encounter: Payer: Self-pay | Admitting: *Deleted

## 2012-02-05 ENCOUNTER — Encounter: Payer: Self-pay | Admitting: Internal Medicine

## 2012-02-05 ENCOUNTER — Ambulatory Visit (INDEPENDENT_AMBULATORY_CARE_PROVIDER_SITE_OTHER): Payer: Medicare Other | Admitting: Internal Medicine

## 2012-02-05 ENCOUNTER — Encounter: Payer: Self-pay | Admitting: *Deleted

## 2012-02-05 VITALS — BP 127/86 | HR 88 | Ht 71.0 in | Wt 176.0 lb

## 2012-02-05 DIAGNOSIS — I428 Other cardiomyopathies: Secondary | ICD-10-CM

## 2012-02-05 DIAGNOSIS — Z95810 Presence of automatic (implantable) cardiac defibrillator: Secondary | ICD-10-CM

## 2012-02-05 DIAGNOSIS — I472 Ventricular tachycardia: Secondary | ICD-10-CM

## 2012-02-05 MED ORDER — CARVEDILOL 12.5 MG PO TABS: 6.2500 mg | ORAL_TABLET | Freq: Every day | ORAL | Status: DC

## 2012-02-05 NOTE — Assessment & Plan Note (Signed)
I have asked the patient to reduce his dose of carvedilol to half tablet (6.25 mg) twice daily.

## 2012-02-05 NOTE — Assessment & Plan Note (Signed)
His Medtronic biventricular ICD is working normally it has reached elective replacement. We will schedule ICD generator change in the next several weeks.

## 2012-02-05 NOTE — Patient Instructions (Signed)
See instruction sheet for generator change    Your physician has recommended you make the following change in your medication:  1) Decrease Carvedilol to 6.25mg  twice daily

## 2012-02-05 NOTE — Progress Notes (Signed)
HPI [name] returns today for followup. He is a very pleasant 71 year old man with an ischemic cardiomyopathy, ventricular tachycardia, complete heart block, status post biventricular ICD implantation. He has reached early battery depletion. The patient denies chest pain or shortness of breath. He notes that in the last several months, his energy level has been poor. His wife was with him today and noted that he sleeps all the time. He wonders if he is not taking too much beta blocker. He denies peripheral edema. No recent ICD shocks. He has a slightly elevated left ventricular pacing threshold. Allergies  Allergen Reactions  . Penicillins     REACTION: Rash     Current Outpatient Prescriptions  Medication Sig Dispense Refill  . allopurinol (ZYLOPRIM) 100 MG tablet Take 1 tablet by mouth daily.      Marland Kitchen aspirin 81 MG tablet Take 81 mg by mouth 2 (two) times daily.       . carvedilol (COREG) 12.5 MG tablet Take 12.5 mg by mouth daily.      Marland Kitchen donepezil (ARICEPT) 10 MG tablet TAKE 1 TABLET BY MOUTH ONCE A DAY  30 tablet  PRN  . furosemide (LASIX) 40 MG tablet TAKE 1 TABLET BY MOUTH ONCE A DAY  30 tablet  PRN  . Multiple Vitamin (MULTIVITAMIN) tablet Take 1 tablet by mouth daily.        Marland Kitchen NAMENDA 10 MG tablet TAKE 1 TABLET BY MOUTH EVERY DAY  30 tablet  PRN  . ramipril (ALTACE) 5 MG capsule Take 5 mg by mouth 2 (two) times daily.        . simvastatin (ZOCOR) 80 MG tablet TAKE 1 TABLET BY MOUTH EVERY DAY  30 tablet  0  . spironolactone (ALDACTONE) 25 MG tablet Take 25 mg by mouth daily.         Past Medical History  Diagnosis Date  . Hypertension   . Chronic tension headaches   . Tinea cruris   . Tinea unguium   . Hernia, hiatal   . CVA (cerebral infarction)   . Cardiomyopathy   . Bradycardia   . Ventricular tachycardia   . Syncope   . CAD (coronary artery disease)   . Somnolence   . Sleep disorder   . Atrial fibrillation   . Automatic implantable cardiac defibrillator in situ     . Dyspnea     on exertion    ROS:   All systems reviewed and negative except as noted in the HPI.   Past Surgical History  Procedure Date  . Vasectomy   . Icd implantation 06/12/08    Medtronic biventricular ICD, remote - no, Dr. Ladona Ridgel  . Cardiac catheterization 02/03/03    Dr. Jenne Campus      No family history on file.   History   Social History  . Marital Status: Married    Spouse Name: N/A    Number of Children: N/A  . Years of Education: N/A   Occupational History  . Not on file.   Social History Main Topics  . Smoking status: Never Smoker   . Smokeless tobacco: Never Used   Comment: denies tobacco use   . Alcohol Use: No  . Drug Use: No  . Sexually Active: Not on file   Other Topics Concern  . Not on file   Social History Narrative   Married and lives in [place].     BP 127/86  Pulse 88  Ht 5\' 11"  (1.803 m)  Wt 176 lb (  79.833 kg)  BMI 24.55 kg/m2  SpO2 95%  Physical Exam:  Well appearing 71 year old man, NAD HEENT: Unremarkable Neck:  No JVD, no thyromegally Lungs:  Clear with no wheezes, rales, or rhonchi. Well-healed ICD incision. HEART:  Regular rate rhythm, no murmurs, no rubs, no clicks Abd:  soft, positive bowel sounds, no organomegally, no rebound, no guarding Ext:  2 plus pulses, no edema, no cyanosis, no clubbing Skin:  No rashes no nodules Neuro:  CN II through XII intact, motor grossly intact  DEVICE  Normal device function.  See PaceArt for details.   Assess/Plan:

## 2012-02-05 NOTE — Assessment & Plan Note (Signed)
He has had no sustained ventricular arrhythmias for several years now. He does clearly have VT however. No change in medications at this time as his ventricular tachycardia appears to be well-controlled.

## 2012-02-09 ENCOUNTER — Encounter (HOSPITAL_COMMUNITY): Payer: Self-pay | Admitting: Pharmacy Technician

## 2012-02-10 ENCOUNTER — Telehealth: Payer: Self-pay | Admitting: Family Medicine

## 2012-02-10 NOTE — Telephone Encounter (Signed)
LM

## 2012-02-17 ENCOUNTER — Other Ambulatory Visit (INDEPENDENT_AMBULATORY_CARE_PROVIDER_SITE_OTHER): Payer: Medicare Other

## 2012-02-17 DIAGNOSIS — I428 Other cardiomyopathies: Secondary | ICD-10-CM

## 2012-02-17 DIAGNOSIS — Z95810 Presence of automatic (implantable) cardiac defibrillator: Secondary | ICD-10-CM

## 2012-02-17 DIAGNOSIS — I472 Ventricular tachycardia: Secondary | ICD-10-CM

## 2012-02-17 LAB — CBC WITH DIFFERENTIAL/PLATELET
Basophils Relative: 0.3 % (ref 0.0–3.0)
Eosinophils Absolute: 0.3 10*3/uL (ref 0.0–0.7)
HCT: 44.3 % (ref 39.0–52.0)
Hemoglobin: 14.4 g/dL (ref 13.0–17.0)
Lymphocytes Relative: 13.1 % (ref 12.0–46.0)
Lymphs Abs: 0.7 10*3/uL (ref 0.7–4.0)
MCHC: 32.5 g/dL (ref 30.0–36.0)
MCV: 89.5 fl (ref 78.0–100.0)
Monocytes Absolute: 0.4 10*3/uL (ref 0.1–1.0)
Neutro Abs: 4.1 10*3/uL (ref 1.4–7.7)
RBC: 4.95 Mil/uL (ref 4.22–5.81)
RDW: 14.1 % (ref 11.5–14.6)

## 2012-02-23 MED ORDER — VANCOMYCIN HCL IN DEXTROSE 1-5 GM/200ML-% IV SOLN
1000.0000 mg | INTRAVENOUS | Status: AC
  Filled 2012-02-23 (×2): qty 200

## 2012-02-23 MED ORDER — SODIUM CHLORIDE 0.9 % IR SOLN
80.0000 mg | Status: AC
  Filled 2012-02-23: qty 2

## 2012-02-24 ENCOUNTER — Encounter (HOSPITAL_COMMUNITY)
Admission: RE | Disposition: A | Discharge status: Home / Self Care | Payer: Self-pay | Source: Ambulatory Visit | Attending: Internal Medicine

## 2012-02-24 ENCOUNTER — Ambulatory Visit (HOSPITAL_COMMUNITY)
Admission: RE | Admit: 2012-02-24 | Discharge: 2012-02-24 | Disposition: A | Discharge status: Home / Self Care | Payer: Medicare Other | Source: Ambulatory Visit | Attending: Internal Medicine | Admitting: Internal Medicine

## 2012-02-24 DIAGNOSIS — I472 Ventricular tachycardia, unspecified: Secondary | ICD-10-CM | POA: Insufficient documentation

## 2012-02-24 DIAGNOSIS — I10 Essential (primary) hypertension: Secondary | ICD-10-CM | POA: Insufficient documentation

## 2012-02-24 DIAGNOSIS — Z95810 Presence of automatic (implantable) cardiac defibrillator: Secondary | ICD-10-CM

## 2012-02-24 DIAGNOSIS — I442 Atrioventricular block, complete: Secondary | ICD-10-CM | POA: Insufficient documentation

## 2012-02-24 DIAGNOSIS — I428 Other cardiomyopathies: Secondary | ICD-10-CM

## 2012-02-24 DIAGNOSIS — I4729 Other ventricular tachycardia: Secondary | ICD-10-CM | POA: Insufficient documentation

## 2012-02-24 DIAGNOSIS — Z4502 Encounter for adjustment and management of automatic implantable cardiac defibrillator: Secondary | ICD-10-CM | POA: Insufficient documentation

## 2012-02-24 DIAGNOSIS — I2589 Other forms of chronic ischemic heart disease: Secondary | ICD-10-CM | POA: Insufficient documentation

## 2012-02-24 HISTORY — PX: IMPLANTABLE CARDIOVERTER DEFIBRILLATOR (ICD) GENERATOR CHANGE: SHX5469

## 2012-02-24 LAB — SURGICAL PCR SCREEN: Staphylococcus aureus: NEGATIVE

## 2012-02-24 SURGERY — ICD GENERATOR CHANGE
Anesthesia: LOCAL

## 2012-02-24 MED ORDER — ACETAMINOPHEN 325 MG PO TABS
325.0000 mg | ORAL_TABLET | ORAL | Status: DC | PRN
Start: 2012-02-24 — End: 2012-02-26
  Administered 2012-02-24: 650 mg via ORAL
  Filled 2012-02-24: qty 2

## 2012-02-24 MED ORDER — LIDOCAINE HCL (PF) 1 % IJ SOLN
INTRAMUSCULAR | Status: AC
  Filled 2012-02-24: qty 60

## 2012-02-24 MED ORDER — ACETAMINOPHEN 325 MG PO TABS
ORAL_TABLET | ORAL | Status: AC
  Filled 2012-02-24: qty 2

## 2012-02-24 MED ORDER — SODIUM CHLORIDE 0.9 % IV SOLN: 250.0000 mL | INTRAVENOUS | Status: DC

## 2012-02-24 MED ORDER — SODIUM CHLORIDE 0.9 % IJ SOLN: 3.0000 mL | Freq: Two times a day (BID) | INTRAMUSCULAR | Status: DC

## 2012-02-24 MED ORDER — MUPIROCIN 2 % EX OINT
TOPICAL_OINTMENT | CUTANEOUS | Status: AC
  Administered 2012-02-24: 1
  Filled 2012-02-24: qty 22

## 2012-02-24 MED ORDER — MIDAZOLAM HCL 5 MG/5ML IJ SOLN
INTRAMUSCULAR | Status: AC
  Filled 2012-02-24: qty 5

## 2012-02-24 MED ORDER — CHLORHEXIDINE GLUCONATE 4 % EX LIQD
60.0000 mL | Freq: Once | CUTANEOUS | Status: DC
  Filled 2012-02-24: qty 60

## 2012-02-24 MED ORDER — MUPIROCIN 2 % EX OINT
TOPICAL_OINTMENT | Freq: Two times a day (BID) | CUTANEOUS | Status: DC
  Filled 2012-02-24: qty 22

## 2012-02-24 MED ORDER — SODIUM CHLORIDE 0.9 % IJ SOLN: 3.0000 mL | INTRAMUSCULAR | Status: DC | PRN

## 2012-02-24 MED ORDER — SODIUM CHLORIDE 0.45 % IV SOLN
INTRAVENOUS | Status: DC
  Administered 2012-02-24: 09:00:00 via INTRAVENOUS

## 2012-02-24 MED ORDER — FENTANYL CITRATE 0.05 MG/ML IJ SOLN
INTRAMUSCULAR | Status: AC
  Filled 2012-02-24: qty 2

## 2012-02-24 NOTE — Op Note (Signed)
ICD removal and insertion of a new device via a subpectoral pocket with DFT testing without immediate complication.Z#[id number].

## 2012-02-24 NOTE — Interval H&P Note (Signed)
History and Physical Interval Note:  02/24/2012 9:58 AM  Nathan Mckinney  has presented today for surgery, with the diagnosis of vt/eri  The various methods of treatment have been discussed with the patient and family. After consideration of risks, benefits and other options for treatment, the patient has consented to  Procedure(s) (LRB) with comments: ICD GENERATOR CHANGE (N/A) as a surgical intervention .  The patient's history has been reviewed, patient examined, no change in status, stable for surgery.  I have reviewed the patient's chart and labs.  Questions were answered to the patient's satisfaction.     Leonia Reeves.D.

## 2012-02-24 NOTE — H&P (View-Only) (Signed)
HPI Mr. Sterkel returns today for followup. He is a very pleasant 71-year-old man with an ischemic cardiomyopathy, ventricular tachycardia, complete heart block, status post biventricular ICD implantation. He has reached early battery depletion. The patient denies chest pain or shortness of breath. He notes that in the last several months, his energy level has been poor. His wife was with him today and noted that he sleeps all the time. He wonders if he is not taking too much beta blocker. He denies peripheral edema. No recent ICD shocks. He has a slightly elevated left ventricular pacing threshold. Allergies  Allergen Reactions  . Penicillins     REACTION: Rash     Current Outpatient Prescriptions  Medication Sig Dispense Refill  . allopurinol (ZYLOPRIM) 100 MG tablet Take 1 tablet by mouth daily.      . aspirin 81 MG tablet Take 81 mg by mouth 2 (two) times daily.       . carvedilol (COREG) 12.5 MG tablet Take 12.5 mg by mouth daily.      . donepezil (ARICEPT) 10 MG tablet TAKE 1 TABLET BY MOUTH ONCE A DAY  30 tablet  PRN  . furosemide (LASIX) 40 MG tablet TAKE 1 TABLET BY MOUTH ONCE A DAY  30 tablet  PRN  . Multiple Vitamin (MULTIVITAMIN) tablet Take 1 tablet by mouth daily.        . NAMENDA 10 MG tablet TAKE 1 TABLET BY MOUTH EVERY DAY  30 tablet  PRN  . ramipril (ALTACE) 5 MG capsule Take 5 mg by mouth 2 (two) times daily.        . simvastatin (ZOCOR) 80 MG tablet TAKE 1 TABLET BY MOUTH EVERY DAY  30 tablet  0  . spironolactone (ALDACTONE) 25 MG tablet Take 25 mg by mouth daily.         Past Medical History  Diagnosis Date  . Hypertension   . Chronic tension headaches   . Tinea cruris   . Tinea unguium   . Hernia, hiatal   . CVA (cerebral infarction)   . Cardiomyopathy   . Bradycardia   . Ventricular tachycardia   . Syncope   . CAD (coronary artery disease)   . Somnolence   . Sleep disorder   . Atrial fibrillation   . Automatic implantable cardiac defibrillator in situ     . Dyspnea     on exertion    ROS:   All systems reviewed and negative except as noted in the HPI.   Past Surgical History  Procedure Date  . Vasectomy   . Icd implantation 06/12/08    Medtronic biventricular ICD, remote - no, Dr. Ladena Jacquez  . Cardiac catheterization 02/03/03    Dr. McQueen      No family history on file.   History   Social History  . Marital Status: Married    Spouse Name: N/A    Number of Children: N/A  . Years of Education: N/A   Occupational History  . Not on file.   Social History Main Topics  . Smoking status: Never Smoker   . Smokeless tobacco: Never Used   Comment: denies tobacco use   . Alcohol Use: No  . Drug Use: No  . Sexually Active: Not on file   Other Topics Concern  . Not on file   Social History Narrative   Married and lives in [place].     BP 127/86  Pulse 88  Ht 5' 11" (1.803 m)  Wt 176 lb (  79.833 kg)  BMI 24.55 kg/m2  SpO2 95%  Physical Exam:  Well appearing 71-year-old man, NAD HEENT: Unremarkable Neck:  No JVD, no thyromegally Lungs:  Clear with no wheezes, rales, or rhonchi. Well-healed ICD incision. HEART:  Regular rate rhythm, no murmurs, no rubs, no clicks Abd:  soft, positive bowel sounds, no organomegally, no rebound, no guarding Ext:  2 plus pulses, no edema, no cyanosis, no clubbing Skin:  No rashes no nodules Neuro:  CN II through XII intact, motor grossly intact  DEVICE  Normal device function.  See PaceArt for details.   Assess/Plan:   

## 2012-02-25 NOTE — Op Note (Signed)
NAME:  EGBERT, SEIDEL NO.:  [id number]  MEDICAL RECORD NO.:  [id number]  LOCATION:  [facility]                         FACILITY:  [facility]  PHYSICIAN:  Doylene Canning. Ladona Ridgel, MD    DATE OF BIRTH:  20-Jan-1941  DATE OF PROCEDURE:  02/24/2012 DATE OF DISCHARGE:                              OPERATIVE REPORT   PROCEDURE PERFORMED:  Removal of a previously implanted BiV ICD, which had reached elective replacement followed by insertion of a new biventricular ICD with placement of the device underneath the subpectoral pocket with defibrillation threshold testing.  INTRODUCTION:  The patient is a 71 year old man with longstanding chronic systolic heart failure, left bundle-branch block, status post BiV ICD insertion.  He has a remote history of VT.  He underwent rate sense lead insertion several years ago.  He is now referred for removal of his old device, which had reached elective replacement and insertion of a new device.  PROCEDURE:  After informed was obtained, the patient was taken to the Diagnostic EP Lab in the fasting state.  After usual preparation and draping, intravenous fentanyl and midazolam was given for sedation.  A 30 mL of lidocaine was infiltrated into the left infraclavicular region. A 7-cm incision was carried out over this region, and electrocautery was utilized to dissect down to the fascial plane.  The ICD pocket was entered with electrocautery.  There was dense fibrous scar tissue present.  This was freed up with electrocautery.  The generator was removed with gentle traction.  The leads were disconnected from the generator and evaluated, and found to be working satisfactorily.  At this point, electrocautery was utilized to dissect down to the subpectoralis major.  Blunt dissection was then utilized to enter the subpectoral pocket and placed the format pocket under pectoralis major and on top of pectoralis minor.  Electrocautery was utilized to  assure hemostasis.  The pocket was irrigated with electrocautery.  The leads were placed in the subpectoral pocket and the Medtronic BiV ICD Viva XT, serial number [id number] was connected to the atrial RV defibrillator and LV leads, and placed back in the subpectoral pocket.  The pocket was irrigated with antibiotic irrigation and the incision was closed with 2- 0 and 3-0 Vicryl with the pectoralis major being approximated with 2-0 Vicryl.  A pressure dressing was then applied and I scrubbed out of the case to supervise defibrillation threshold testing.  After the patient was more deeply sedated with fentanyl and Versed under my direct supervision, VF was induced with a T-wave shock.  A 20-joule shock was delivered through the device by way of the RV coil and successfully defibrillated the patient back to sinus rhythm.  No additional defibrillation threshold testing was carried out, and a pressure dressing was applied and Steri-Strips were applied, and the patient was returned to his room in satisfactory condition.  COMPLICATIONS:  There were no immediate procedure complications.  RESULTS:  This demonstrate successful removal of a previously implanted dual-chamber BiV ICD followed by successful subpectoral pocket and insertion of a new biventricular ICD with defibrillation threshold testing.     Doylene Canning. Ladona Ridgel, MD     GWT/MEDQ  D:  02/24/2012  T:  02/25/2012  Job:  [id number]

## 2012-03-02 ENCOUNTER — Telehealth: Payer: Self-pay | Admitting: Internal Medicine

## 2012-03-02 NOTE — Telephone Encounter (Signed)
New problem:   C/O tried, dizzy , S/p  Recently pacemaker inserted.

## 2012-03-02 NOTE — Telephone Encounter (Signed)
Called patient, last night he was dizzy and had to have someone drive him home.  This started prior to the generator change and has not gotten better.  He was hoping for the energy level to get better.. Today his BP is 107/64 HR 74.  He says that 6 weeks ago Dr Alanda Amass( his primary Cardiologist) decreased the  Carvedilol to 6.25mg  bid due to the tiredness.  He can not tell any difference since that was decreased.  There was a women on the phone at the same time that says he has not been taking his Furosemide due to having to go the [facility] all the time.  Yesterday it sounds like he may have been some dehydrated, he admits to drinking tea and not water.  I have asked him to try and drink more water and less tea, Stay off the fluid pill, weigh himself daily.  If his weight goes up by 3 pounds to take it until weight back to baseline.  His diastolic BP may be lower tahn normal for him causing him to feel this way also.  He is feeling okay today.  He will keep his appointment on Mon for device f/u post generator change and call back if starts to feel dizzy again.

## 2012-03-08 ENCOUNTER — Encounter: Payer: Self-pay | Admitting: Internal Medicine

## 2012-03-08 ENCOUNTER — Ambulatory Visit (INDEPENDENT_AMBULATORY_CARE_PROVIDER_SITE_OTHER): Payer: Medicare Other | Admitting: *Deleted

## 2012-03-08 DIAGNOSIS — I459 Conduction disorder, unspecified: Secondary | ICD-10-CM

## 2012-03-08 DIAGNOSIS — Z95810 Presence of automatic (implantable) cardiac defibrillator: Secondary | ICD-10-CM

## 2012-03-08 DIAGNOSIS — I4891 Unspecified atrial fibrillation: Secondary | ICD-10-CM

## 2012-03-08 DIAGNOSIS — I498 Other specified cardiac arrhythmias: Secondary | ICD-10-CM

## 2012-03-08 DIAGNOSIS — R0989 Other specified symptoms and signs involving the circulatory and respiratory systems: Secondary | ICD-10-CM

## 2012-03-08 DIAGNOSIS — I428 Other cardiomyopathies: Secondary | ICD-10-CM

## 2012-03-08 LAB — ICD DEVICE OBSERVATION
ATRIAL PACING ICD: 67.9 pct
LV LEAD IMPEDENCE ICD: 247 Ohm
RV LEAD IMPEDENCE ICD: 513 Ohm
TZON-0003SLOWVT: 359.2 ms

## 2012-03-08 NOTE — Patient Instructions (Signed)
Next followup 06-01-12 @ 330 pm with Dr Ladona Ridgel.

## 2012-06-01 ENCOUNTER — Encounter: Payer: Medicare Other | Admitting: Internal Medicine

## 2012-07-08 ENCOUNTER — Other Ambulatory Visit (HOSPITAL_COMMUNITY): Payer: Self-pay | Admitting: Cardiovascular Disease

## 2012-07-08 DIAGNOSIS — Z95810 Presence of automatic (implantable) cardiac defibrillator: Secondary | ICD-10-CM

## 2012-07-08 DIAGNOSIS — I714 Abdominal aortic aneurysm, without rupture: Secondary | ICD-10-CM

## 2012-07-08 DIAGNOSIS — I509 Heart failure, unspecified: Secondary | ICD-10-CM

## 2012-07-20 ENCOUNTER — Ambulatory Visit (HOSPITAL_COMMUNITY)
Admission: RE | Admit: 2012-07-20 | Discharge: 2012-07-20 | Disposition: A | Discharge status: Home / Self Care | Payer: Medicare Other | Source: Ambulatory Visit | Attending: Cardiovascular Disease | Admitting: Cardiovascular Disease

## 2012-07-20 DIAGNOSIS — I714 Abdominal aortic aneurysm, without rupture: Secondary | ICD-10-CM

## 2012-07-20 DIAGNOSIS — I509 Heart failure, unspecified: Secondary | ICD-10-CM | POA: Insufficient documentation

## 2012-07-20 DIAGNOSIS — Z95810 Presence of automatic (implantable) cardiac defibrillator: Secondary | ICD-10-CM

## 2012-07-20 DIAGNOSIS — I079 Rheumatic tricuspid valve disease, unspecified: Secondary | ICD-10-CM | POA: Insufficient documentation

## 2012-07-20 DIAGNOSIS — I080 Rheumatic disorders of both mitral and aortic valves: Secondary | ICD-10-CM | POA: Insufficient documentation

## 2012-07-20 NOTE — Progress Notes (Signed)
Aorta Duplex Completed. Larene Pickett T

## 2012-07-20 NOTE — Progress Notes (Signed)
2D Echo Performed 07/20/2012    Naeemah Jasmer, RCS  

## 2012-09-13 ENCOUNTER — Telehealth: Payer: Self-pay | Admitting: Cardiology

## 2012-09-13 NOTE — Telephone Encounter (Signed)
Nathan Mckinney called needing Prior authorization for Eliquis, which the pt was placed on while a pt at [facility] in [place], [place].  His wife tells me it is for he heart rate.  She has bought pills for today, I have asked her to go by our office in AM for samples until Dr. Kandis Cocking nurse or Triage nurse can call for the prior authorization.  I am having to assume he had atrial fib.  Nathan Mckinney was agreeable to this approach.

## 2012-09-14 NOTE — Telephone Encounter (Signed)
Message forwarded to [facility]. Berlinda Last, LPN.  Samples left at front desk.

## 2012-09-15 ENCOUNTER — Other Ambulatory Visit: Payer: Self-pay | Admitting: Cardiovascular Disease

## 2012-09-15 LAB — CBC WITH DIFFERENTIAL/PLATELET
Basophils Absolute: 0 10*3/uL (ref 0.0–0.1)
Basophils Relative: 0 % (ref 0–1)
Eosinophils Relative: 8 % — ABNORMAL HIGH (ref 0–5)
HCT: 44.8 % (ref 39.0–52.0)
Hemoglobin: 15.3 g/dL (ref 13.0–17.0)
Lymphocytes Relative: 19 % (ref 12–46)
MCHC: 34.2 g/dL (ref 30.0–36.0)
MCV: 83.6 fL (ref 78.0–100.0)
Monocytes Absolute: 0.8 10*3/uL (ref 0.1–1.0)
Monocytes Relative: 10 % (ref 3–12)
Neutro Abs: 4.6 10*3/uL (ref 1.7–7.7)
RDW: 14.7 % (ref 11.5–15.5)

## 2012-09-15 LAB — COMPREHENSIVE METABOLIC PANEL
Albumin: 3.8 g/dL (ref 3.5–5.2)
BUN: 26 mg/dL — ABNORMAL HIGH (ref 6–23)
Calcium: 9 mg/dL (ref 8.4–10.5)
Chloride: 105 mEq/L (ref 96–112)
Creat: 1.48 mg/dL — ABNORMAL HIGH (ref 0.50–1.35)
Glucose, Bld: 98 mg/dL (ref 70–99)
Potassium: 4.4 mEq/L (ref 3.5–5.3)

## 2012-09-15 NOTE — Telephone Encounter (Signed)
Pt. Seen in office  ;samples of eliquis given until I can get a chance to call insurance co to get prior approval

## 2012-09-16 LAB — BRAIN NATRIURETIC PEPTIDE: Brain Natriuretic Peptide: 471.9 pg/mL — ABNORMAL HIGH (ref 0.0–100.0)

## 2012-09-17 NOTE — Telephone Encounter (Signed)
I called and talked to pt.s wife and told her i would send a script to [facility] and see if it would go through; but if it didn't i would get a PA done to be able to continue the medication. Pt.s wife stated understanding of instructions

## 2012-09-21 ENCOUNTER — Encounter: Payer: Self-pay | Admitting: Cardiovascular Disease

## 2012-09-23 ENCOUNTER — Telehealth: Payer: Self-pay | Admitting: Cardiovascular Disease

## 2012-09-23 NOTE — Telephone Encounter (Signed)
Would like to speak to someone about Nathan Mckinney  Medications, was put on several new medications and JC said that he should be feeling better, but he is not. His vitals are good but he is weak and his breathing is not good. Please call [name] at [phone]   Thanks

## 2012-09-23 NOTE — Telephone Encounter (Signed)
Returned call.  Spoke w/ June, pt's wife.  Stated she wants to talk to [facility] about the new medication pt is taking.  Stated pt is SOB, real weak and just wants to sleep or stay in bed all of the time.  Denied pt has CP or swelling.  Stated pt's BP was 108/75 w/ HR 87( LA) and 99/74 w/ HR 77 (RA).  Wife would like to talk to [facility] b/c he knows more about what's going on.  Informed JC/Dr. Alanda Amass will be notified.  Verbalized understanding and agreed w/ plan.   Message forwarded to [facility]. Berlinda Last, LPN to discuss w/ Dr. Alanda Amass.  This note and paper chart# [id number] placed on Dr. Kandis Cocking cart.

## 2012-09-23 NOTE — Telephone Encounter (Signed)
Spoke to pt.s wife and appt. Changed to monday 09-27-2012 @  3:15 PM Wife agreed with plan and stated she would bring pt. In on Monday.

## 2012-09-27 ENCOUNTER — Other Ambulatory Visit: Payer: Self-pay

## 2012-09-29 ENCOUNTER — Other Ambulatory Visit: Payer: Self-pay | Admitting: Cardiovascular Disease

## 2012-09-29 LAB — CBC WITH DIFFERENTIAL/PLATELET
Basophils Absolute: 0 10*3/uL (ref 0.0–0.1)
Eosinophils Relative: 11 % — ABNORMAL HIGH (ref 0–5)
Lymphocytes Relative: 13 % (ref 12–46)
Lymphs Abs: 0.8 10*3/uL (ref 0.7–4.0)
Neutro Abs: 4.5 10*3/uL (ref 1.7–7.7)
Neutrophils Relative %: 67 % (ref 43–77)
Platelets: 208 10*3/uL (ref 150–400)
RBC: 4.81 MIL/uL (ref 4.22–5.81)
RDW: 15.1 % (ref 11.5–15.5)
WBC: 6.6 10*3/uL (ref 4.0–10.5)

## 2012-09-30 LAB — COMPREHENSIVE METABOLIC PANEL
ALT: 14 U/L (ref 0–53)
AST: 17 U/L (ref 0–37)
Calcium: 9.1 mg/dL (ref 8.4–10.5)
Chloride: 109 mEq/L (ref 96–112)
Creat: 1.52 mg/dL — ABNORMAL HIGH (ref 0.50–1.35)
Potassium: 4.3 mEq/L (ref 3.5–5.3)
Sodium: 143 mEq/L (ref 135–145)
Total Protein: 6.2 g/dL (ref 6.0–8.3)

## 2012-10-08 LAB — ICD DEVICE OBSERVATION
AL IMPEDENCE ICD: 456 Ohm
BAMS-0001: 171 {beats}/min
LV LEAD IMPEDENCE ICD: 228 Ohm
MODE SWITCH EPISODES: 856
TZAT-0001FASTVT: 1
TZAT-0001FASTVT: 2
TZAT-0001SLOWVT: 1
TZAT-0013SLOWVT: 3
TZAT-0018FASTVT: NEGATIVE
TZAT-0018SLOWVT: NEGATIVE
TZON-0003AFLUTTER: 350.8 ms
TZON-0003SLOWVT: 359.2 ms
TZON-0003VSLOWVT: 419.5 ms
TZON-0004VSLOWVT: 36
TZON-0005SLOWVT: 12
TZST-0001FASTVT: 5
TZST-0003FASTVT: 35 J
TZST-0003SLOWVT: 35 J
TZST-0003SLOWVT: 35 J
TZST-0003SLOWVT: 35 J
VENTRICULAR PACING ICD: 81.5 pct

## 2012-10-13 ENCOUNTER — Telehealth: Payer: Self-pay | Admitting: Cardiovascular Disease

## 2012-10-13 NOTE — Telephone Encounter (Signed)
[name] wants to know if Mr.Nathan Mckinney can get Apixaban to [place] because the apixaban is not on the formulary ... Please Call   Thanks

## 2012-10-13 NOTE — Telephone Encounter (Signed)
Returned call and spoke w/ [name].  Informed message received and MD out of office x 2 weeks.  Informed chart reviewed and it appears PA was done for med and response not noted in chart.  Informed samples are available for pt and RN can call to inform pt.  [name] verbalized understanding.  Stated pt's wife stated she has samples and a discount card to receive first month free.    Call to pt and pt not available.  Spoke w/ pt's wife, June.  Confirmed pt has samples and still has free month to pick up.  Will call back if more samples need one week before pt runs out.  Also asked about refill on atorvastatin 20 mg.  Stated pt is out and [place] needs a new prescription.  Informed MD out of office x 2 weeks and Rx cannot be written until his return.  Offered to notify another MD and wife asked if Rx can be sent to [facility] on [address].  Informed RN can send refill there and completed while talking w/ wife.  Informed Dr. Alanda Amass will be notified pt needs new Rx for atorvastatin to take to [facility].  Verbalized understanding.  1) Can Eliquis be changed to Pradaxa?  2) Need new Rx written for Atorvastatin to take to [facility] forwarded to [facility]. Berlinda Last, LPN to discuss w/ Dr. Alanda Amass.  This note and paper chart# [id number] placed on Dr. Kandis Cocking cart.

## 2012-10-21 ENCOUNTER — Encounter (HOSPITAL_COMMUNITY): Payer: Self-pay | Admitting: *Deleted

## 2012-10-21 ENCOUNTER — Emergency Department (HOSPITAL_COMMUNITY): Payer: Non-veteran care

## 2012-10-21 ENCOUNTER — Observation Stay (HOSPITAL_COMMUNITY)
Admission: EM | Admit: 2012-10-21 | Discharge: 2012-10-23 | Disposition: A | Discharge status: Home / Self Care | Payer: Non-veteran care | Attending: Internal Medicine | Admitting: Internal Medicine

## 2012-10-21 DIAGNOSIS — R42 Dizziness and giddiness: Secondary | ICD-10-CM | POA: Insufficient documentation

## 2012-10-21 DIAGNOSIS — N183 Chronic kidney disease, stage 3 unspecified: Secondary | ICD-10-CM | POA: Diagnosis present

## 2012-10-21 DIAGNOSIS — R197 Diarrhea, unspecified: Secondary | ICD-10-CM | POA: Insufficient documentation

## 2012-10-21 DIAGNOSIS — R5381 Other malaise: Secondary | ICD-10-CM | POA: Insufficient documentation

## 2012-10-21 DIAGNOSIS — R6883 Chills (without fever): Secondary | ICD-10-CM | POA: Insufficient documentation

## 2012-10-21 DIAGNOSIS — I5023 Acute on chronic systolic (congestive) heart failure: Secondary | ICD-10-CM | POA: Diagnosis present

## 2012-10-21 DIAGNOSIS — R112 Nausea with vomiting, unspecified: Secondary | ICD-10-CM | POA: Insufficient documentation

## 2012-10-21 DIAGNOSIS — I4819 Other persistent atrial fibrillation: Secondary | ICD-10-CM | POA: Diagnosis present

## 2012-10-21 DIAGNOSIS — I6350 Cerebral infarction due to unspecified occlusion or stenosis of unspecified cerebral artery: Secondary | ICD-10-CM | POA: Diagnosis present

## 2012-10-21 DIAGNOSIS — I129 Hypertensive chronic kidney disease with stage 1 through stage 4 chronic kidney disease, or unspecified chronic kidney disease: Secondary | ICD-10-CM | POA: Insufficient documentation

## 2012-10-21 DIAGNOSIS — R0602 Shortness of breath: Secondary | ICD-10-CM | POA: Insufficient documentation

## 2012-10-21 DIAGNOSIS — G44229 Chronic tension-type headache, not intractable: Secondary | ICD-10-CM | POA: Insufficient documentation

## 2012-10-21 DIAGNOSIS — R5383 Other fatigue: Secondary | ICD-10-CM | POA: Diagnosis present

## 2012-10-21 DIAGNOSIS — Z95810 Presence of automatic (implantable) cardiac defibrillator: Secondary | ICD-10-CM | POA: Insufficient documentation

## 2012-10-21 DIAGNOSIS — I4891 Unspecified atrial fibrillation: Secondary | ICD-10-CM | POA: Insufficient documentation

## 2012-10-21 DIAGNOSIS — I2510 Atherosclerotic heart disease of native coronary artery without angina pectoris: Secondary | ICD-10-CM | POA: Diagnosis present

## 2012-10-21 DIAGNOSIS — R079 Chest pain, unspecified: Principal | ICD-10-CM | POA: Insufficient documentation

## 2012-10-21 DIAGNOSIS — R109 Unspecified abdominal pain: Secondary | ICD-10-CM | POA: Insufficient documentation

## 2012-10-21 DIAGNOSIS — F0280 Dementia in other diseases classified elsewhere without behavioral disturbance: Secondary | ICD-10-CM | POA: Diagnosis present

## 2012-10-21 HISTORY — DX: Presence of cardiac pacemaker: Z95.0

## 2012-10-21 HISTORY — DX: Presence of automatic (implantable) cardiac defibrillator: Z95.810

## 2012-10-21 HISTORY — DX: Shortness of breath: R06.02

## 2012-10-21 HISTORY — DX: Cerebral infarction, unspecified: I63.9

## 2012-10-21 HISTORY — DX: Unspecified systolic (congestive) heart failure: I50.20

## 2012-10-21 HISTORY — DX: Unspecified osteoarthritis, unspecified site: M19.90

## 2012-10-21 LAB — URINE MICROSCOPIC-ADD ON

## 2012-10-21 LAB — URINALYSIS, ROUTINE W REFLEX MICROSCOPIC
Glucose, UA: NEGATIVE mg/dL
Hgb urine dipstick: NEGATIVE
Protein, ur: 30 mg/dL — AB
Urobilinogen, UA: 1 mg/dL (ref 0.0–1.0)

## 2012-10-21 LAB — CBC
HCT: 43.4 % (ref 39.0–52.0)
Hemoglobin: 14.5 g/dL (ref 13.0–17.0)
MCH: 29.1 pg (ref 26.0–34.0)
MCV: 87.1 fL (ref 78.0–100.0)
Platelets: 199 10*3/uL (ref 150–400)
RBC: 4.98 MIL/uL (ref 4.22–5.81)

## 2012-10-21 LAB — BASIC METABOLIC PANEL
BUN: 21 mg/dL (ref 6–23)
CO2: 25 mEq/L (ref 19–32)
Calcium: 8.8 mg/dL (ref 8.4–10.5)
Creatinine, Ser: 1.58 mg/dL — ABNORMAL HIGH (ref 0.50–1.35)
Glucose, Bld: 111 mg/dL — ABNORMAL HIGH (ref 70–99)

## 2012-10-21 LAB — HEPATIC FUNCTION PANEL
ALT: 24 U/L (ref 0–53)
Alkaline Phosphatase: 87 U/L (ref 39–117)
Indirect Bilirubin: 1 mg/dL — ABNORMAL HIGH (ref 0.3–0.9)
Total Bilirubin: 1.2 mg/dL (ref 0.3–1.2)
Total Protein: 6.6 g/dL (ref 6.0–8.3)

## 2012-10-21 LAB — TROPONIN I: Troponin I: <0.3 ng/mL (ref <0.30)

## 2012-10-21 LAB — DIGOXIN LEVEL: Digoxin Level: 1 ng/mL (ref 0.8–2.0)

## 2012-10-21 LAB — MAGNESIUM: Magnesium: 2.1 mg/dL (ref 1.5–2.5)

## 2012-10-21 MED ORDER — FUROSEMIDE 40 MG PO TABS
40.0000 mg | ORAL_TABLET | Freq: Every day | ORAL | Status: DC
  Filled 2012-10-21: qty 1

## 2012-10-21 MED ORDER — SODIUM CHLORIDE 0.9 % IJ SOLN
3.0000 mL | Freq: Two times a day (BID) | INTRAMUSCULAR | Status: DC
  Administered 2012-10-21 – 2012-10-23 (×4): 3 mL via INTRAVENOUS

## 2012-10-21 MED ORDER — ALLOPURINOL 100 MG PO TABS
100.0000 mg | ORAL_TABLET | Freq: Every evening | ORAL | Status: DC
  Administered 2012-10-21 – 2012-10-22 (×2): 100 mg via ORAL
  Filled 2012-10-21 (×3): qty 1

## 2012-10-21 MED ORDER — FUROSEMIDE 10 MG/ML IJ SOLN
40.0000 mg | Freq: Once | INTRAMUSCULAR | Status: AC
  Administered 2012-10-21: 40 mg via INTRAVENOUS

## 2012-10-21 MED ORDER — ASPIRIN EC 81 MG PO TBEC
81.0000 mg | DELAYED_RELEASE_TABLET | Freq: Every day | ORAL | Status: DC
  Administered 2012-10-21 – 2012-10-23 (×3): 81 mg via ORAL
  Filled 2012-10-21 (×3): qty 1

## 2012-10-21 MED ORDER — RAMIPRIL 2.5 MG PO CAPS
2.5000 mg | ORAL_CAPSULE | Freq: Every evening | ORAL | Status: DC
  Administered 2012-10-21 – 2012-10-22 (×2): 2.5 mg via ORAL
  Filled 2012-10-21 (×3): qty 1

## 2012-10-21 MED ORDER — ONDANSETRON HCL 4 MG/2ML IJ SOLN: 4.0000 mg | Freq: Three times a day (TID) | INTRAMUSCULAR | Status: AC | PRN

## 2012-10-21 MED ORDER — ATORVASTATIN CALCIUM 20 MG PO TABS
20.0000 mg | ORAL_TABLET | Freq: Every day | ORAL | Status: DC
  Administered 2012-10-21 – 2012-10-23 (×3): 20 mg via ORAL
  Filled 2012-10-21 (×3): qty 1

## 2012-10-21 MED ORDER — METOPROLOL TARTRATE 50 MG PO TABS
150.0000 mg | ORAL_TABLET | Freq: Every day | ORAL | Status: DC
  Administered 2012-10-21 – 2012-10-23 (×3): 150 mg via ORAL
  Filled 2012-10-21 (×3): qty 1

## 2012-10-21 MED ORDER — APIXABAN 5 MG PO TABS
5.0000 mg | ORAL_TABLET | Freq: Two times a day (BID) | ORAL | Status: DC
Start: 2012-10-21 — End: 2012-10-23
  Administered 2012-10-21 – 2012-10-23 (×4): 5 mg via ORAL
  Filled 2012-10-21 (×5): qty 1

## 2012-10-21 NOTE — ED Notes (Signed)
Pt ambulated to restroom. 

## 2012-10-21 NOTE — ED Notes (Signed)
Patient unable to provide urine sample

## 2012-10-21 NOTE — ED Notes (Signed)
Patient has gone to radiology.  Will request urine on his return.

## 2012-10-21 NOTE — ED Notes (Signed)
Pt given urinal.

## 2012-10-21 NOTE — H&P (Signed)
Date: 10/21/2012               Patient Name:  Nathan Mckinney MRN: [id number]  DOB: 09/14/1940 Age / Sex: 72 y.o., male   PCP: Pcp Not In System         Medical Service: Internal Medicine Teaching Service         Attending Physician: Dr. Criselda Peaches    First Contact: Dr. Mariea Clonts Pager: [phone]  Second Contact: Dr. Everardo Beals Pager: [phone]       After Hours (After 5p/  First Contact Pager: [phone]  weekends / holidays): Second Contact Pager: [phone]   Chief Complaint: Generalized fatigue  History of Present Illness:  36 y o male with hx of CAD (reports recent cardiac cath one month prior), sCHF (35-40%) & A fib came to the hospital on 10/21/12 with left sided sternal chest pain described as sharp  (like being kicked), non-radiating , all throughout last night, started around 9pm, not related to position, with several similar episodes in the past, and no aggravating or relieving factors on this occasion (but tylenol has helped in the past), unrelated to activity.  Currently he is chest pain free. Today, he reports that pain started while he was lying in bed. No hx of prolonged immobilization. Also reports of palpitations & that he feels his heart skips a bit.  He experiences SOB over the last moth, present at both rest and on exertion (after only climbing 4 steps). Occasionally he is awakened by SOB, uses one pillow to sleep, & denies increase in pillows. No leg swelling, no abdominal swelling/bloating.  He notes occasional diaphoresis that resolves spontaneously.  He has occasional dizziness and nonspecific HA without falls.  He came to the hospital today because the [place] called to tell him about an abnormal elevated blood results (due to a drug, cannot identify name) and increasing fatigue/weakness.  Regarding other symptoms, he reports vomiting ( though cannot quantify how much, last vomited a 2 days prior, looks like yellow, nonbloody, usually food contents, wife thinks he overeats and this causes his  vomiting). No hx of nausea.  He notes loose stools (not normal for him) that started since before last hospitalization about a month ago, described as dark without blood, ~4x/day, with associated mild and generalized abdominal pain.  He denies recent antibiotic use, & is not on a PPI.  Finally he reports non specific abdominal pain.  Of note, he follows with Dr. Alanda Amass [facility], and his wife reports recent medication changes that they are finding hard to keep track of.  Review of Systems: Constitutional: +chills but no fever, good appetite change. Increased fatigue/sleepiness HEENT: Denies photophobia, eye pain, redness, hearing loss, ear pain, congestion, sore throat, rhinorrhea, sneezing, trouble swallowing, neck pain, neck stiffness and tinnitus.  Respiratory: per HPI Cardiovascular: per HPI Gastrointestinal:  Per HPI Genitourinary: Denies dysuria, urgency, frequency, hematuria, flank pain and difficulty urinating.  Musculoskeletal: Denies myalgias, back pain, joint swelling, arthralgias and gait problem.  Skin: Denies pallor, rash and wound.  Neurological: seizures, syncope, numbness and headaches.  Psychiatric/Behavioral: Depressed mood, occasional agitation    Meds: Medication Sig  . acetaminophen (TYLENOL) 500 MG tablet Take 1,000 mg by mouth every 6 (six) hours as needed for pain.  Marland Kitchen allopurinol (ZYLOPRIM) 100 MG tablet Take 1 tablet by mouth every evening.   Marland Kitchen amiodarone (PACERONE) 200 MG tablet Take 100 mg by mouth every evening.  Marland Kitchen apixaban (ELIQUIS) 5 MG TABS tablet Take 5 mg by  mouth 2 (two) times daily.  Marland Kitchen atorvastatin (LIPITOR) 20 MG tablet Take 20 mg by mouth daily.  . digoxin (LANOXIN) 0.125 MG tablet Take 0.125 mg by mouth every evening.  . donepezil (ARICEPT) 10 MG tablet Take 10 mg by mouth daily.  . furosemide (LASIX) 40 MG tablet Take 40 mg by mouth daily. Do not take if BP is less than 100 dystolic (top #).  . metoprolol (LOPRESSOR) 100 MG tablet Take 150  mg by mouth daily.  . ramipril (ALTACE) 2.5 MG capsule Take 2.5 mg by mouth every evening.  FUROSEMIDE STOPPED 1-2 WEEKS PRIOR  Allergies: Allergies as of 10/21/2012 - Review Complete 10/21/2012  Allergen Reaction Noted  . Penicillins Rash    Past Medical History  Diagnosis Date  . Hypertension   . Chronic tension headaches   . Tinea cruris   . Tinea unguium   . Hernia, hiatal   . CVA (cerebral infarction)   . Cardiomyopathy   . Bradycardia   . Ventricular tachycardia   . Syncope   . CAD (coronary artery disease)   . Somnolence   . Sleep disorder   . Atrial fibrillation   . Automatic implantable cardiac defibrillator in situ   . Myocardial infarction 2001  . Automatic implantable cardioverter-defibrillator in situ   . Pacemaker     ICD/PACEMAKER   . Shortness of breath   . Stroke   . Arthritis     HANDS   Past Surgical History  Procedure Laterality Date  . Vasectomy    . Icd implantation, also has a pacer inserted  06/12/08    Medtronic biventricular ICD, remote - no, Dr. Ladona Ridgel  . Cardiac catheterization  02/03/03    Dr. Jenne Campus    Family History  Problem Relation Age of Onset  . Cancer Mother     unclear what type  . Cancer Father     stomach    History   Social History  . Marital Status: Married    Spouse Name: N/A    Number of Children: N/A  . Years of Education: N/A   Occupational History  . Not on file.   Social History Main Topics  . Smoking status: Never Smoker   . Smokeless tobacco: Never Used     Comment: denies tobacco use   . Alcohol Use: Very occasionally  . Drug Use: No  . Sexually Active: Not on file   Other Topics Concern  . Not on file   Social History Narrative   Married and lives in [place].    Physical Exam: Blood pressure 115/74, pulse 70, temperature 96.6 F (35.9 C), temperature source Rectal, resp. rate 21, SpO2 96.00%. General: resting in bed, appears mildly dyspneic HEENT: atraumatic, Normochephalic,  PERRL, EOMI,  no scleral icterus, No mouth ulcers. 3 by 3 cm moblie soft mass at the back of the neck. No carotid bruit, no cervical lymphadenopahy. Cardiac:Heart sounds present, no rubs, murmurs or gallops. AICD and pacer present on left chest, chest nontender to palpation Pulm: clear to auscultation bilaterally, moving normal volumes of air Abd: soft, nontender, nondistended, BS normoactive Ext: Pedal pulses present 2+ bilat, warm and well perfused, no pedal edema Neuro: alert and oriented X3, cranial nerves II-XII grossly intact, power 4+ in all extremities. Normal finger to nose.  Sensation grossly intact.   Lab results: Basic Metabolic Panel:  Recent Labs  09/81/19 1013  NA 141  K 4.5  CL 106  CO2 25  GLUCOSE 111*  BUN 21  CREATININE 1.58*  CALCIUM 8.8  MG 2.1  PHOS 3.6  AG: 10  Liver Function Tests:  Recent Labs  10/21/12 1013  AST 24  ALT 24  ALKPHOS 87  BILITOT 1.2  PROT 6.6  ALBUMIN 3.2*   CBC:  Recent Labs  10/21/12 1013  WBC 6.8  HGB 14.5  HCT 43.4  MCV 87.1  PLT 199   Cardiac Enzymes: iStat Troponin: 0.04  BNP:  Recent Labs  10/21/12 1013  PROBNP 7758.0*    Imaging results:  Dg Chest 1 View 10/21/2012    Findings: Ill-defined bibasilar opacities (left greater than right) are noted, with an appearance suggestive of some bronchiectasis in the left lower lobe.  No definite pleural effusions.  Mild pulmonary venous congestion, without frank pulmonary edema.  Mild cardiomegaly is unchanged.  Left-sided biventricular pacemaker/AICD in place with lead tips projecting over the expected location of the right atrium, right ventricular apex and overlying the lateral wall of the left ventricle via the coronary sinus and coronary veins.  Previously noted nodular opacity in the lateral aspect of the lower right lung corresponds to the patient's nipple (nipple markers are placed on today's examination).   IMPRESSION: 1.  Ill-defined bibasilar opacities are favored to  represent a combination of atelectasis and/or scarring, and there may be some very mild cylindrical bronchiectasis in the left lower lobe. 2.  Previous suspected nodule in the lower right hemithorax corresponds to the patient's nipple (this is a benign finding). 3.  Mild cardiomegaly.     Dg Chest 2 View 10/21/2012    Findings: The cardiac silhouette is mildly enlarged.  No mediastinal or hilar masses.  The lungs show mild interstitial thickening and mild chronic bronchitic change in the lower lobes, stable.  There is no infiltrate or pulmonary edema.  There is a nodular shadow projecting over the lateral right lower lung on the AP view not evident on the lateral view.  This may reflect a nipple shadow.  Recommend follow-up / repeat frontal chest radiograph with nipple markers.  No pleural effusion or pneumothorax.  Left anterior chest wall AICD is stable well-positioned.  The bony thorax is intact.   IMPRESSION: No acute cardiopulmonary disease.  Nodular opacity at the right lateral lung base on the AP view only. Recommend repeat frontal view with nipple markers.      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  Acute etiologies such as dissection, esophageal rupture or pneumothorax not likely given CXR findings, history & he is hemodynamically stable.  PE less likely as revised geneva is 1 (7-9% risk of PE).  He does have history of afib, symptoms may be related to arrythmia.  Not TTP, so MSK etiology not likely.  Symptoms not typical of GERD.  Non pleuritic, so costochondritis less likely. - Admit to observation on telemetry - Cycle CE x 3 - Start ASA 81 - Continue BB and statin - NTG & morphine  prn Chest pain -O2 as needed to maintain sat >92%  #  Fatigue with Vomiting/Diarrhea-  Symptoms have been chronic and are vague.  Infectious etiology less likely, especially given afebrile, no leukocytosis  and intermittent symptoms.  More likely are digoxin toxicity versus amiodarone induced thyroid dysfunction, especially since patient seems confused about medications and dosages.  Wife does report that she feels he over-eats and vomits, he may have component of GERD.  If no organic etiology discovered, consider geriatric depression. -Temporarily hold digoxin & amiodarone -Digoxin level stat -Trial of PPI -If further diarrhea, consider C.diff -TSH  # Systolic CHF exercebation- EF- 35-40% Precipitated by non compliance with medication- Frusemide. IV frusemide- 40mg  1 dose today. Tabs frusemide 40mg  daily.   #Afib- Rate control with metoprolol, anticoagulation with eliquis; has also been managed on digoxin & amio -Continue home eliquis & metoprolol -hold dig & amio as above  # CAD- patient has had cardiac cath in the past, also has an AICD  And pacer present. Was placed on metoprolol, Aspirin and Atovastatin. -Will need to retreive records from [employer] [facility] (request initiated in ED)  #VTE ppx: Continue home eliquis     Dispo: Disposition is deferred at this time, awaiting improvement of current medical problems. Anticipated discharge in approximately 2-3 day(s).   The patient does have a current PCP (Pcp Not In System - [facility]) and does not need an [facility] hospital follow-up appointment after discharge.  The patient does not have transportation limitations that hinder transportation to clinic appointments.  Signed: Inocente Salles, MD 10/21/2012, 1:50 PM

## 2012-10-21 NOTE — ED Notes (Signed)
Pt's wife states pt has increased weakness for 2 weeks, will not get out of bed, She states she seems more confused. Was called by [facility] pcp for abnormal lab, nurse instructed Pt to come to ED.

## 2012-10-21 NOTE — ED Provider Notes (Signed)
History    CSN: [id number] Arrival date & time 10/21/12  1000  First MD Initiated Contact with Patient 10/21/12 1022     Chief Complaint  Patient presents with  . Chest Pain   (Consider location/radiation/quality/duration/timing/severity/associated sxs/prior Treatment) HPI Comments: Patient is a 72 year old male past medical history significant for hypertension, history of CVA, ischemic cardiomyopathy, ICD placement, CAD presents to the emergency department for one-month generalized weakness and fatigue. Patient states that he was admitted to the hospital and placed in ICU at [facility] in [facility] for chest pain where the patient underwent a cardiac catheterization and cardioconversion. Patient states his defibrillator went off while he was in the CCU. Since then the patient has been very fatigued with nausea, non-bloody non-bilious vomiting, diarrhea, generalized mild abdominal pain w/o radiation, central sharp chest pain without radiation, shortness of breath, decreased appetite. Patient does not complain of any abdominal pain at this time. He rates his CP as mild. Patient and his wife also state that they were notified from the [place] about a concerning lab finding and were told to come to the ED for evaluation, but state the nurse did not tell his wife what it was. Patient denies any fevers, cough, leg swelling, dysuria.   The history is provided by the patient, a relative and medical records.      Past Medical History  Diagnosis Date  . Hypertension   . Chronic tension headaches   . Tinea cruris   . Tinea unguium   . Hernia, hiatal   . CVA (cerebral infarction)   . Cardiomyopathy   . Bradycardia   . Ventricular tachycardia   . Syncope   . CAD (coronary artery disease)   . Somnolence   . Sleep disorder   . Atrial fibrillation   . Automatic implantable cardiac defibrillator in situ    Past Surgical History  Procedure Laterality Date  . Vasectomy     . Icd implantation  06/12/08    Medtronic biventricular ICD, remote - no, Dr. Ladona Ridgel  . Cardiac catheterization  02/03/03    Dr. Jenne Campus    History reviewed. No pertinent family history. History  Substance Use Topics  . Smoking status: Never Smoker   . Smokeless tobacco: Never Used     Comment: denies tobacco use   . Alcohol Use: No    Review of Systems  Constitutional: Positive for chills and fatigue. Negative for fever and diaphoresis.  Respiratory: Positive for shortness of breath.   Cardiovascular: Positive for chest pain. Negative for leg swelling.  Gastrointestinal: Positive for nausea, vomiting, abdominal pain and diarrhea.  All other systems reviewed and are negative.    Allergies  Penicillins  Home Medications   Current Outpatient Rx  Name  Route  Sig  Dispense  Refill  . acetaminophen (TYLENOL) 500 MG tablet   Oral   Take 1,000 mg by mouth every 6 (six) hours as needed for pain.         Marland Kitchen allopurinol (ZYLOPRIM) 100 MG tablet   Oral   Take 1 tablet by mouth every evening.          Marland Kitchen amiodarone (PACERONE) 200 MG tablet   Oral   Take 100 mg by mouth every evening.         Marland Kitchen apixaban (ELIQUIS) 5 MG TABS tablet   Oral   Take 5 mg by mouth 2 (two) times daily.         Marland Kitchen atorvastatin (LIPITOR) 20  MG tablet   Oral   Take 20 mg by mouth daily.         . digoxin (LANOXIN) 0.125 MG tablet   Oral   Take 0.125 mg by mouth every evening.         . donepezil (ARICEPT) 10 MG tablet   Oral   Take 10 mg by mouth daily.         . furosemide (LASIX) 40 MG tablet   Oral   Take 40 mg by mouth daily. Do not take if BP is less than 100 dystolic (top #).         . metoprolol (LOPRESSOR) 100 MG tablet   Oral   Take 150 mg by mouth daily.         . ramipril (ALTACE) 2.5 MG capsule   Oral   Take 2.5 mg by mouth every evening.          BP 115/74  Pulse 70  Temp(Src) 96.6 F (35.9 C) (Rectal)  Resp 21  SpO2 96% Physical Exam   Constitutional: He is oriented to person, place, and time. He appears well-developed. He is active. No distress.  HENT:  Head: Normocephalic and atraumatic.  Eyes: Conjunctivae are normal.  Neck: Neck supple.  Cardiovascular: Normal rate, regular rhythm, normal heart sounds and intact distal pulses.   Pulmonary/Chest: Effort normal and breath sounds normal. Not tachypneic and not bradypneic. He has no wheezes. He has no rales. He exhibits no tenderness.  Abdominal: Soft. Bowel sounds are normal. There is no tenderness. There is no rigidity, no rebound and no guarding.  Musculoskeletal: He exhibits no edema and no tenderness.  Neurological: He is alert and oriented to person, place, and time.  Skin: Skin is warm and dry. He is not diaphoretic. No erythema.    ED Course  Procedures (including critical care time)   Date: 10/21/2012  Rate: 73  Rhythm: AV dual paced rhythm.   QRS Axis: indeterminate  Intervals: normal  ST/T Wave abnormalities: indeterminate  Conduction Disutrbances:none  Narrative Interpretation:   Old EKG Reviewed: unchanged    Labs Reviewed  BASIC METABOLIC PANEL - Abnormal; Notable for the following:    Glucose, Bld 111 (*)    Creatinine, Ser 1.58 (*)    GFR calc non Af Amer 42 (*)    GFR calc Af Amer 48 (*)    All other components within normal limits  PRO B NATRIURETIC PEPTIDE - Abnormal; Notable for the following:    Pro B Natriuretic peptide (BNP) 7758.0 (*)    All other components within normal limits  CBC  URINALYSIS, ROUTINE W REFLEX MICROSCOPIC  HEPATIC FUNCTION PANEL  MAGNESIUM  PHOSPHORUS  POCT I-STAT TROPONIN I   Dg Chest 1 View  10/21/2012   *RADIOLOGY REPORT*  Clinical Data: Chest pain.  CHEST - 1 VIEW  Comparison: Chest x-ray 10/21/2012.  Findings: Ill-defined bibasilar opacities (left greater than right) are noted, with an appearance suggestive of some bronchiectasis in the left lower lobe.  No definite pleural effusions.  Mild pulmonary  venous congestion, without frank pulmonary edema.  Mild cardiomegaly is unchanged.  Left-sided biventricular pacemaker/AICD in place with lead tips projecting over the expected location of the right atrium, right ventricular apex and overlying the lateral wall of the left ventricle via the coronary sinus and coronary veins.  Previously noted nodular opacity in the lateral aspect of the lower right lung corresponds to the patient's nipple (nipple markers are placed on today's examination).  IMPRESSION: 1.  Ill-defined bibasilar opacities are favored to represent a combination of atelectasis and/or scarring, and there may be some very mild cylindrical bronchiectasis in the left lower lobe. 2.  Previous suspected nodule in the lower right hemithorax corresponds to the patient's nipple (this is a benign finding). 3.  Mild cardiomegaly.   Original Report Authenticated By: Trudie Reed, M.D.   Dg Chest 2 View  10/21/2012   *RADIOLOGY REPORT*  Clinical Data: Chest pain for 1 month with cough and shortness of breath.  CHEST - 2 VIEW  Comparison: 02/27/2011  Findings: The cardiac silhouette is mildly enlarged.  No mediastinal or hilar masses.  The lungs show mild interstitial thickening and mild chronic bronchitic change in the lower lobes, stable.  There is no infiltrate or pulmonary edema.  There is a nodular shadow projecting over the lateral right lower lung on the AP view not evident on the lateral view.  This may reflect a nipple shadow.  Recommend follow-up / repeat frontal chest radiograph with nipple markers.  No pleural effusion or pneumothorax.  Left anterior chest wall AICD is stable well-positioned.  The bony thorax is intact.  IMPRESSION: No acute cardiopulmonary disease.  Nodular opacity at the right lateral lung base on the AP view only. Recommend repeat frontal view with nipple markers.   Original Report Authenticated By: Amie Portland, M.D.   1. Chest pain   2. Shortness of breath   3. Nausea &  vomiting   4. Diarrhea     MDM  Patient w/ worsening BNP (up from 1100s in May) and month long symptoms of CP, SOB, N/V/D will be admitted for further work up. Patient appears to be in declining health w/o other findings on PE. Labs, EKG, imaging reviewed. Patient d/w with Dr. Blinda Leatherwood, agrees with plan. The patient appears reasonably stabilized for admission considering the current resources, flow, and capabilities available in the ED at this time, and I doubt any other [facility] requiring further screening and/or treatment in the ED prior to admission.        Jeannetta Ellis, PA-C 10/21/12 1531

## 2012-10-21 NOTE — ED Notes (Signed)
Patient transported to X-ray 

## 2012-10-21 NOTE — ED Notes (Signed)
Pt reports hx of cardiac arrest and being in ICU one month ago. Having chest pains since then. Also having dizziness today, ekg done at triage. Airway intact.

## 2012-10-21 NOTE — ED Notes (Signed)
MD at bedside. 

## 2012-10-22 ENCOUNTER — Encounter (HOSPITAL_COMMUNITY): Payer: Self-pay | Admitting: Internal Medicine

## 2012-10-22 DIAGNOSIS — I5023 Acute on chronic systolic (congestive) heart failure: Secondary | ICD-10-CM

## 2012-10-22 HISTORY — DX: Acute on chronic systolic (congestive) heart failure: I50.23

## 2012-10-22 LAB — BASIC METABOLIC PANEL
CO2: 26 mEq/L (ref 19–32)
Calcium: 8.8 mg/dL (ref 8.4–10.5)
Glucose, Bld: 82 mg/dL (ref 70–99)
Potassium: 4.4 mEq/L (ref 3.5–5.1)
Sodium: 144 mEq/L (ref 135–145)

## 2012-10-22 LAB — LIPASE, BLOOD: Lipase: 34 U/L (ref 11–59)

## 2012-10-22 MED ORDER — DIGOXIN 125 MCG PO TABS
0.1250 mg | ORAL_TABLET | Freq: Every day | ORAL | Status: DC
  Administered 2012-10-22 – 2012-10-23 (×2): 0.125 mg via ORAL
  Filled 2012-10-22 (×2): qty 1

## 2012-10-22 MED ORDER — AMIODARONE HCL 100 MG PO TABS
100.0000 mg | ORAL_TABLET | Freq: Every day | ORAL | Status: DC
  Administered 2012-10-22 – 2012-10-23 (×2): 100 mg via ORAL
  Filled 2012-10-22 (×2): qty 1

## 2012-10-22 MED ORDER — CARBAMIDE PEROXIDE 6.5 % OT SOLN
10.0000 [drp] | Freq: Every day | OTIC | Status: DC
  Administered 2012-10-22 – 2012-10-23 (×2): 10 [drp] via OTIC
  Filled 2012-10-22: qty 15

## 2012-10-22 MED ORDER — FUROSEMIDE 40 MG PO TABS
40.0000 mg | ORAL_TABLET | Freq: Every day | ORAL | Status: DC
  Administered 2012-10-22 – 2012-10-23 (×2): 40 mg via ORAL
  Filled 2012-10-22 (×2): qty 1

## 2012-10-22 NOTE — Progress Notes (Signed)
The patient stated that he felt better this morning and slept well last night.  He did not have any complaints at this time.

## 2012-10-22 NOTE — Plan of Care (Signed)
Problem: Phase I Progression Outcomes Goal: EF % per last Echo/documented,Core Reminder form on chart Outcome: Completed/Met Date Met:  10/22/12 35-40% per note

## 2012-10-22 NOTE — Progress Notes (Signed)
Nutrition Brief Note  Patient identified on the Malnutrition Screening Tool (MST) Report. Pt is currently eating 100% of meals. Per EPIC weight hx, weights have been stable:  Wt Readings from Last 15 Encounters:  10/22/12 164 lb 3.2 oz (74.481 kg)  02/24/12 154 lb (69.854 kg)  02/24/12 154 lb (69.854 kg)  02/05/12 176 lb (79.833 kg)  01/30/12 178 lb (80.74 kg)  09/26/10 184 lb (83.462 kg)  12/20/08 184 lb (83.462 kg)  10/10/08 180 lb (81.647 kg)    Body mass index is 22.91 kg/(m^2). Patient meets criteria for WNL based on current BMI.   Current diet order is Heart Healthy, patient is consuming approximately 100% of meals at this time. Labs and medications reviewed.   No nutrition interventions warranted at this time. If nutrition issues arise, please consult RD.   Nathan Motto MS, RD, LDN Pager: [phone] After-hours pager: [phone]

## 2012-10-22 NOTE — Progress Notes (Signed)
Pt ambulated hall w/wife no complaints of SOB

## 2012-10-22 NOTE — H&P (Signed)
I saw and evaluated the patient. I reviewed the resident's note and confirmed the resident's findings. I agree with the assessment and plan as documented in the resident's note.  Briefly, Nathan Mckinney is a 72yo male who presents with fatigue and increased sleepiness.  Nathan Mckinney reports that his main issue fatigue.  He presented to the hospital to address this issue, and also because he was told by the [facility] system (PCP in that system) that he should be seen because of "an elevated drug level."  He is not sure what drug.  He notes that he fatigue is not like him and that "all I want to do is sleep."  He also complains of nausea and "not much" vomiting.  He notes that he has a normal appetite, but recently, when trying to eat something, it will sometimes come right back up.  He denies abdominal pain.  He notes that all of these symptoms started since being in the hospital about a month ago.  He further notes loose stools with previous constipation.  When the loose stool started, he was going once an hour, but now this has slowed down to 3-4 BM's per day.  When I saw him, he did not report concern about his episode of chest pain, as documented in Dr. Mariea Clonts.  He does have some chronic DOE and a nonspecific headache which has improved.  He follows with Dr. Alanda Amass in Cardiology and after his most recent hospital stay, his medications were changed which has been confusing for him.  He notes that he was recently advised to stop his lasix ( in the last couple of week) but is not sure why.   On exam, he is alert and cooperative.  He is thin and does not appear in distress.  He has a systolic murmur, lungs are clear, good bowel sounds.  No edema peripherally.  His pro BNP was > 7000, up from last check at [facility].  His last EF from that hospital was 15%.   For his chest pain, it is unclear what caused this episode of chest pain.  It did come on while lying down and has improved with lasix, CE are negative.   However, his CXR was not concerning for pulmonary edema.  Symptoms are not typical for GERD either, however, he does have nausea/vomiting with eating.  Will start protonix.  Continue medications for heart failure.  Holding digoxin and amiodarone for now.    For his fatigue, I think this may be related to worsening of his cardiac function, possibly due to poor pump function with a  Very low EF.  We will need a thorough list of his home medications in order to make sure that he is on appropriate therapy.   For his vomiting/diarrhea, protonix as noted, check a lipase, TSH, digoxin level normal  For his systolic heart failure, will restart lasix.  He received IV lasix overnight and feels much improved this morning.  He did have a bump in his renal function, however, this could be related to CRS.  Will give PO lasix at previous home dose today.  If renal function continues to worsen, will likely ask his cardiologist to see him and assist in his medication regimen.   Signed  Nathan Mckinney, Nathan Mckinney

## 2012-10-22 NOTE — Progress Notes (Signed)
Pt O4x no complaints of pain. Pt w/ home ear drops. MD put in order and pharm verified home med

## 2012-10-22 NOTE — Progress Notes (Signed)
Subjective:  Patient reports he feels much better.  Objective: Vital signs in last 24 hours: Filed Vitals:   10/21/12 1759 10/21/12 2120 10/22/12 0145 10/22/12 0605  BP: 118/80 111/73 100/62 119/81  Pulse: 70 70 69 65  Temp:  94.2 F (34.6 C) 97.3 F (36.3 C) 97.4 F (36.3 C)  TempSrc:  Axillary Oral Oral  Resp:  18 18 18   Height:      Weight:    74.481 kg (164 lb 3.2 oz)  SpO2:  96% 97% 95%   Weight change:   Intake/Output Summary (Last 24 hours) at 10/22/12 7846 Last data filed at 10/22/12 0148  Gross per 24 hour  Intake    483 ml  Output   1501 ml  Net  -1018 ml   General appearance: alert, cooperative and appears stated age Head: Normocephalic, without obvious abnormality, atraumatic Lungs: clear to auscultation bilaterally Chest wall: no tenderness Heart: AICD and pacer on left chest, loud S2 Abdomen: soft, non-tender; bowel sounds normal; no masses,  no organomegaly  Lab Results: Basic Metabolic Panel:  Recent Labs Lab 10/21/12 1013 10/22/12 0430  NA 141 144  K 4.5 4.4  CL 106 107  CO2 25 26  GLUCOSE 111* 82  BUN 21 22  CREATININE 1.58* 1.78*  CALCIUM 8.8 8.8  MG 2.1  --   PHOS 3.6  --    Liver Function Tests:  Recent Labs Lab 10/21/12 1013  AST 24  ALT 24  ALKPHOS 87  BILITOT 1.2  PROT 6.6  ALBUMIN 3.2*   CBC:  Recent Labs Lab 10/21/12 1013  WBC 6.8  HGB 14.5  HCT 43.4  MCV 87.1  PLT 199   Cardiac Enzymes:  Recent Labs Lab 10/21/12 1713 10/21/12 2245 10/22/12 0430  TROPONINI <0.30 <0.30 <0.30   BNP:  Recent Labs Lab 10/21/12 1013  PROBNP 7758.0*    Thyroid Function Tests:  Recent Labs Lab 10/21/12 1722  TSH 3.857   Urinalysis:  Recent Labs Lab 10/21/12 1427  COLORURINE AMBER*  LABSPEC 1.021  PHURINE 6.0  GLUCOSEU NEGATIVE  HGBUR NEGATIVE  BILIRUBINUR SMALL*  KETONESUR NEGATIVE  PROTEINUR 30*  UROBILINOGEN 1.0  NITRITE NEGATIVE  LEUKOCYTESUR NEGATIVE   Misc. Labs:  Digoxin level:  1.0   Medications: I have reviewed the patient's current medications. Scheduled Meds: . allopurinol  100 mg Oral QPM  . apixaban  5 mg Oral BID  . aspirin EC  81 mg Oral Daily  . atorvastatin  20 mg Oral Daily  . furosemide  40 mg Oral Daily  . metoprolol  150 mg Oral Daily  . ramipril  2.5 mg Oral QPM  . sodium chloride  3 mL Intravenous Q12H   Continuous Infusions:  PRN Meds:.  Assessment/Plan:  [name] is a 72 yo M with history of CAD, Afib, sCHF, HTN, h/o CVA and AICD admitted on 10/21/12 with vague complaints of chest pain, fatigue, nausea and vomiting.  # Chest pain- Currently chest pain free, EKG- AV paced. CAD- cardiac enz x3 were negative. May have been related to acute on chronic sCHF.    # Fatigue, Nausea, Vomiting- Digoxin levels normal; TSH wnl; no s/s of infection; these symtoms probably due to fluid accumulation from sCHF (Echo on 09/07/12 at [facility] (records available in Care Everywhere) showed EF 15-20%) -continue oral Frusemide, at previous home dose. -Restart digoxin. - Improvement without protonix, will not start at this time - review home meds. - Continue ACEI and betablocker   #  Afib: rate controlled & anticoagulated -Cont eliquis & metoprolol -restart amio  #Gout: cont home ppx with allopurinol  #VTE ppx: anticoag with eliquis    Dispo: Disposition is deferred at this time, awaiting improvement of current medical problems.  Anticipated discharge in approximately 1 day(s).   The patient does have a current PCP (Pcp Not In System) and does not need an [facility] hospital follow-up appointment after discharge.  The patient does not have transportation limitations that hinder transportation to clinic appointments.  .Services Needed at time of discharge: Y = Yes, Blank = No PT:   OT:   RN:   Equipment:   Other:     LOS: 1 day   Kennis Carina, MD 10/22/2012, 6:27 AM

## 2012-10-23 LAB — BASIC METABOLIC PANEL
CO2: 23 mEq/L (ref 19–32)
Calcium: 8.5 mg/dL (ref 8.4–10.5)
GFR calc non Af Amer: 40 mL/min — ABNORMAL LOW (ref >90)
Glucose, Bld: 91 mg/dL (ref 70–99)
Potassium: 3.8 mEq/L (ref 3.5–5.1)
Sodium: 141 mEq/L (ref 135–145)

## 2012-10-23 MED ORDER — FUROSEMIDE 40 MG PO TABS: 40.0000 mg | ORAL_TABLET | Freq: Every day | ORAL | Status: DC

## 2012-10-23 NOTE — Discharge Summary (Signed)
Name: Nathan Mckinney MRN: [id number] DOB: 1940-12-02 72 y.o. 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     allopurinol 100 MG tablet  Commonly known as:  ZYLOPRIM  Take 1 tablet by mouth every evening.     amiodarone 200 MG tablet  Commonly known as:  PACERONE  Take 100 mg by mouth every evening.     atorvastatin 20 MG tablet  Commonly known as:  LIPITOR  Take 20 mg by mouth daily.     digoxin 0.125 MG tablet  Commonly known as:  LANOXIN  Take 0.125 mg by mouth every evening.     donepezil 10 MG tablet  Commonly known as:  ARICEPT  Take 10 mg by mouth daily.     ELIQUIS 5 MG Tabs tablet  Generic drug:  apixaban  Take 5 mg by mouth 2 (two) times daily.     furosemide 40 MG tablet  Commonly known as:  LASIX  Take 1 tablet (40 mg total) by mouth daily.     metoprolol 100 MG tablet  Commonly known as:  LOPRESSOR  Take 150 mg by mouth daily.     ramipril 2.5 MG capsule  Commonly known as:  ALTACE  Take 2.5 mg by mouth every evening.        Disposition and follow-up:   Nathan Mckinney was discharged from [facility] in Good condition.  At the hospital follow up visit please address:  1.  Signs/symptoms of sCHF exacerbation  2.  Labs / imaging needed at time of follow-up: BMET to monitor renal fxn on daily lasix (was not taking daily prior to admission)   Follow-up Appointments:     Follow-up Information   Follow up with  Governor Rooks, MD. Schedule an appointment as soon as possible for a visit in 1 week. (For hospital follow up)    Contact information:   [address] [place] [address] [phone]       Discharge Instructions: Discharge Orders   Future Orders Complete By Expires     (HEART FAILURE PATIENTS) Call MD:  Anytime you have any of the following symptoms: 1) 3 pound weight gain in 24 hours or 5 pounds in 1 week 2) shortness of breath, with or without a dry hacking cough 3) swelling in the hands, feet or stomach 4) if you have to sleep on extra pillows at night in order to breathe.  As directed     Diet - low sodium heart healthy  As directed     Discharge instructions  As directed     Comments:      We believe your symptoms were due to acute on chronic heart failure.  It is important that you take lasix (Furosemide, which you may know as your water pill that makes your urinate) 40mg  daily until you follow up with your  cardiologist.    Increase activity slowly  As directed        Consultations:  None  Procedures Performed:  Dg Chest 1 View 10/21/2012    Findings: Ill-defined bibasilar opacities (left greater than right) are noted, with an appearance suggestive of some bronchiectasis in the left lower lobe.  No definite pleural effusions.  Mild pulmonary venous congestion, without frank pulmonary edema.  Mild cardiomegaly is unchanged.  Left-sided biventricular pacemaker/AICD in place with lead tips projecting over the expected location of the right atrium, right ventricular apex and overlying the lateral wall of the left ventricle via the coronary sinus and coronary veins.  Previously noted nodular opacity in the lateral aspect of the lower right lung corresponds to the patient's nipple (nipple markers are placed on today's examination).   IMPRESSION: 1.  Ill-defined bibasilar opacities are favored to represent a combination of atelectasis and/or scarring, and there may be  some very mild cylindrical bronchiectasis in the left lower lobe. 2.  Previous suspected nodule in the lower right hemithorax corresponds to the patient's nipple (this is a benign finding). 3.  Mild cardiomegaly.   .D.   Dg Chest 2 View 10/21/2012    Findings: The cardiac silhouette is mildly enlarged.  No mediastinal or hilar masses.  The lungs show mild interstitial thickening and mild chronic bronchitic change in the lower lobes, stable.  There is no infiltrate or pulmonary edema.  There is a nodular shadow projecting over the lateral right lower lung on the AP view not evident on the lateral view.  This may reflect a nipple shadow.  Recommend follow-up / repeat frontal chest radiograph with nipple markers.  No pleural effusion or pneumothorax.  Left anterior chest wall AICD is stable well-positioned.  The bony thorax is intact.   IMPRESSION: No acute cardiopulmonary disease.  Nodular opacity at the right lateral lung base on the AP view only. Recommend repeat frontal view with nipple markers.     Admission HPI:  89 y o male with hx of CAD (reports recent cardiac cath one month prior), sCHF (35-40%) & A fib came to the hospital on 10/21/12 with left sided sternal chest pain described as sharp (like being kicked), non-radiating , all throughout last night, started around 9pm, not related to position, with several similar episodes in the past, and no aggravating or relieving factors on this occasion (but tylenol has helped in the past), unrelated to activity. Currently he is chest pain free. Today, he reports that pain started while he was lying in bed. No hx of prolonged immobilization. Also reports of palpitations & that he feels his heart skips a bit. He experiences SOB over the last moth, present at both rest and on exertion (after only climbing 4 steps). Occasionally he is awakened by SOB, uses one pillow to sleep, & denies increase in pillows. No leg swelling, no abdominal swelling/bloating. He notes  occasional diaphoresis that resolves spontaneously. He has occasional dizziness and nonspecific HA without falls. He came to the hospital today because the [place] called to tell him about an abnormal elevated blood results (due to a drug, cannot identify name) and increasing fatigue/weakness.  Regarding other symptoms, he reports vomiting ( though cannot quantify how much, last vomited a 2 days prior, looks like yellow, nonbloody, usually food contents, wife thinks he overeats and this causes his vomiting). No hx of nausea. He notes loose stools (not normal for him) that started since before last hospitalization about a month ago, described as  dark without blood, ~4x/day, with associated mild and generalized abdominal pain. He denies recent antibiotic use, & is not on a PPI. Finally he reports non specific abdominal pain.  Of note, he follows with Dr. Alanda Amass [facility], and his wife reports recent medication changes that they are finding hard to keep track of.  Physical Exam:  Blood pressure 115/74, pulse 70, temperature 96.6 F (35.9 C), temperature source Rectal, resp. rate 21, SpO2 96.00%.  General: resting in bed, appears mildly dyspneic  HEENT: atraumatic, Normochephalic, PERRL, EOMI, no scleral icterus, No mouth ulcers. 3 by 3 cm moblie soft mass at the back of the neck. No carotid bruit, no cervical lymphadenopahy.  Cardiac:Heart sounds present, no rubs, murmurs or gallops. AICD and pacer present on left chest, chest nontender to palpation  Pulm: clear to auscultation bilaterally, moving normal volumes of air  Abd: soft, nontender, nondistended, BS normoactive  Ext: Pedal pulses present 2+ bilat, warm and well perfused, no pedal edema  Neuro: alert and oriented X3, cranial nerves II-XII grossly intact, power 4+ in all extremities. Normal finger to nose. Sensation grossly intact.   Lab results:  Basic Metabolic Panel:   10/21/12 1013   NA  141   K  4.5   CL  106   CO2  25   GLUCOSE   111*   BUN  21   CREATININE  1.58*   CALCIUM  8.8   MG  2.1   PHOS  3.6   AG: 10   Liver Function Tests:   10/21/12 1013   AST  24   ALT  24   ALKPHOS  87   BILITOT  1.2   PROT  6.6   ALBUMIN  3.2*    CBC:   10/21/12 1013   WBC  6.8   HGB  14.5   HCT  43.4   MCV  87.1   PLT  199    Cardiac Enzymes:  iStat Troponin: 0.04   BNP:   10/21/12 1013   PROBNP  7758.0*      Hospital Course by problem list: Mr. ZYIER DYKEMA is a 72 yo M with history of CAD, Afib, sCHF, HTN, h/o CVA and AICD admitted on 10/21/12 with vague complaints of chest pain, fatigue, nausea and vomiting.   #Acute on Chronic CHF: Initially, symptoms thought to be due to digoxin toxicity, but digoxin level was 1.00.  Also, we considered amiodarone induced thyroid dysfunction, but TSH was wnl.  Upon further chart review, EF 15-20% on echo in [place] in 08/2012 . Patient was not taking lasix for the last 1-2 weeks. Chest pain, N/V/D resolved (without use of protonix). Energy improved. Appetite good. Likely experiencing gut edema contributing to symptoms. CAD (-) x 3. At discharge, patient was instructed to continue oral Frusemide 40mg  dialy until follow up with primary cardiologist.  He was also continued on his home digoxin, ACEI and betablocker.  #Afib: Continued rate control with metoprolol & amiodarone.  Continued anticoagulation with eliquis. Stable without issues.  #Gout: Not active.  Continued on home ppx with allopurinol   #VTE ppx: anticoag with eliquis during hospitalization  Discharge Vitals:   BP 107/66  Pulse 75  Temp(Src) 98 F (36.7 C) (Oral)  Resp 18  Ht 5\' 11"  (1.803 m)  Wt 165 lb 6.4 oz (75.025 kg)  BMI 23.08 kg/m2  SpO2 98%  Discharge Labs:  Results for orders placed during the hospital encounter of 10/21/12 (from the past 24 hour(s))  BASIC METABOLIC PANEL     Status: Abnormal   Collection Time    10/23/12  5:30 AM      Result Value Range   Sodium 141  135 - 145 mEq/L    Potassium 3.8  3.5 - 5.1 mEq/L   Chloride 105  96 - 112 mEq/L   CO2 23  19 - 32 mEq/L   Glucose, Bld 91  70 - 99 mg/dL   BUN 23  6 - 23 mg/dL   Creatinine, Ser 2.13 (*) 0.50 - 1.35 mg/dL   Calcium 8.5  8.4 - 08.6 mg/dL   GFR calc non Af Amer 40 (*) >90 mL/min   GFR calc Af Amer 46 (*) >90 mL/min    Signed: Belia Heman, MD 10/23/2012, 10:02 AM   Time Spent on Discharge: 25 minutes Services Ordered on Discharge: None Equipment Ordered on Discharge: None

## 2012-10-23 NOTE — Progress Notes (Signed)
The patient did not have any complaints this morning.

## 2012-10-23 NOTE — Progress Notes (Signed)
Pt d/c home w/wife. D/c instructions and medications reviewed with Pt. Pt states understanding. All Pt questions answered.

## 2012-10-23 NOTE — Progress Notes (Signed)
The patient read his heart failure packet.  The RN answered the patient's questions concerning his medications.

## 2012-10-23 NOTE — ED Provider Notes (Signed)
Medical screening examination/treatment/procedure(s) were conducted as a shared visit with non-physician practitioner(s) and myself.  I personally evaluated the patient during the encounter.   Patient presents with multiple complaints. Patient has generalized weakness and fatigue. He has been experiencing chest pain. Patient was recently hospitalized at [facility] for congestive heart failure. Patient workup today is consistent with recurrent congestive heart failure requiring hospitalization.  Gilda Crease, MD 10/23/12 [phone]

## 2012-10-23 NOTE — Progress Notes (Signed)
Subjective: Feels significantly better.  No further loose BMs, vomiting or nausea.    Objective: Vital signs in last 24 hours: Filed Vitals:   10/22/12 0902 10/22/12 1334 10/22/12 2104 10/23/12 0437  BP: 113/75 105/70 103/68 113/64  Pulse: 71 70 69 70  Temp:  97.8 F (36.6 C) 98.1 F (36.7 C) 98 F (36.7 C)  TempSrc:  Axillary Oral Oral  Resp: 15 18 18 16   Height:      Weight:    165 lb 6.4 oz (75.025 kg)  SpO2: 97% 96% 92% 94%   Weight change: -3 lb 0.5 oz (-1.375 kg)  Intake/Output Summary (Last 24 hours) at 10/23/12 0836 Last data filed at 10/23/12 0437  Gross per 24 hour  Intake    843 ml  Output    920 ml  Net    -77 ml   General: resting in bed, no acute distress HEENT: PERRL, EOMI Cardiac: RRR, loud S2 Pulm: clear to auscultation bilaterally, moving normal volumes of air Abd: soft, nontender, nondistended, BS normoactive Ext: warm and well perfused, no pedal edema Neuro: alert and oriented X3, cranial nerves II-XII grossly intact  Lab Results: Basic Metabolic Panel:  Recent Labs Lab 10/21/12 1013 10/22/12 0430 10/23/12 0530  NA 141 144 141  K 4.5 4.4 3.8  CL 106 107 105  CO2 25 26 23   GLUCOSE 111* 82 91  BUN 21 22 23   CREATININE 1.58* 1.78* 1.65*  CALCIUM 8.8 8.8 8.5  MG 2.1  --   --   PHOS 3.6  --   --    Liver Function Tests:  Recent Labs Lab 10/21/12 1013  AST 24  ALT 24  ALKPHOS 87  BILITOT 1.2  PROT 6.6  ALBUMIN 3.2*    Recent Labs Lab 10/22/12 0430  LIPASE 34   CBC:  Recent Labs Lab 10/21/12 1013  WBC 6.8  HGB 14.5  HCT 43.4  MCV 87.1  PLT 199   Cardiac Enzymes:  Recent Labs Lab 10/21/12 1713 10/21/12 2245 10/22/12 0430  TROPONINI <0.30 <0.30 <0.30   BNP:  Recent Labs Lab 10/21/12 1013  PROBNP 7758.0*   Thyroid Function Tests:  Recent Labs Lab 10/21/12 1722  TSH 3.857    Medications: I have reviewed the patient's current medications. Scheduled Meds: . allopurinol  100 mg Oral QPM  .  amiodarone  100 mg Oral Daily  . apixaban  5 mg Oral BID  . aspirin EC  81 mg Oral Daily  . atorvastatin  20 mg Oral Daily  . carbamide peroxide  10 drop Both Ears Daily  . digoxin  0.125 mg Oral Daily  . furosemide  40 mg Oral Daily  . metoprolol  150 mg Oral Daily  . ramipril  2.5 mg Oral QPM  . sodium chloride  3 mL Intravenous Q12H   Continuous Infusions:  PRN Meds:. Assessment/Plan: Nathan Mckinney is a 72 yo M with history of CAD, Afib, sCHF, HTN, h/o CVA and AICD admitted on 10/21/12 with vague complaints of chest pain, fatigue, nausea and vomiting.   #Acute on Chronic CHF: EF 15-20% on echo in [place] in 08/2012 .  Was not taking lasix for the last 1-2 weeks.  Chest pain, N/V/D resolved.  Energy improved.  Appetite good.  Likely having gut edema contributing to symptoms.  CAD (-) x 3.   -continue oral Frusemide 40mg  dialy  -Cont digoxin.  - Continue ACEI and betablocker   #Afib: rate controlled & anticoagulated  -  Cont eliquis, amio, & metoprolol   #Gout: cont home ppx with allopurinol   #VTE ppx: anticoag with eliquis  Dispo: Anticipate home today with cardiology follow up later this week (patient reports appt with Dr. Alanda Amass early this upcoming week)  The patient does have a current PCP (Pcp Not In System) and does not need an [facility] hospital follow-up appointment after discharge.  The patient does not have transportation limitations that hinder transportation to clinic appointments.  .Services Needed at time of discharge: Y = Yes, Blank = No PT:   OT:   RN:   Equipment:   Other:     LOS: 2 days   Nathan Heman, MD 10/23/2012, 8:36 AM

## 2012-10-25 NOTE — Discharge Summary (Signed)
I saw Nathan Mckinney on day of discharge and agree with plan.  Note that CE were negative X 3, not CAD.

## 2012-10-27 NOTE — Telephone Encounter (Signed)
Pt. Called and informed taht it was ok to change from eliquis to pradaxa 150mg  bid and also he can pick up a script for atorvastatin 20mg 

## 2012-10-29 ENCOUNTER — Other Ambulatory Visit: Payer: Self-pay | Admitting: Cardiovascular Disease

## 2012-10-29 ENCOUNTER — Encounter: Payer: Self-pay | Admitting: Internal Medicine

## 2012-10-30 LAB — COMPREHENSIVE METABOLIC PANEL
ALT: 14 U/L (ref 0–53)
AST: 20 U/L (ref 0–37)
Albumin: 4.1 g/dL (ref 3.5–5.2)
Alkaline Phosphatase: 87 U/L (ref 39–117)
Glucose, Bld: 107 mg/dL — ABNORMAL HIGH (ref 70–99)
Potassium: 5.1 mEq/L (ref 3.5–5.3)
Sodium: 142 mEq/L (ref 135–145)
Total Bilirubin: 1.6 mg/dL — ABNORMAL HIGH (ref 0.3–1.2)
Total Protein: 6.9 g/dL (ref 6.0–8.3)

## 2012-10-30 LAB — CBC WITH DIFFERENTIAL/PLATELET
Basophils Relative: 1 % (ref 0–1)
Hemoglobin: 14.9 g/dL (ref 13.0–17.0)
MCHC: 33.2 g/dL (ref 30.0–36.0)
Monocytes Relative: 8 % (ref 3–12)
Neutro Abs: 5.2 10*3/uL (ref 1.7–7.7)
Neutrophils Relative %: 72 % (ref 43–77)
Platelets: 242 10*3/uL (ref 150–400)
RBC: 5.29 MIL/uL (ref 4.22–5.81)

## 2012-10-30 LAB — BRAIN NATRIURETIC PEPTIDE: Brain Natriuretic Peptide: 913 pg/mL — ABNORMAL HIGH (ref 0.0–100.0)

## 2012-11-03 ENCOUNTER — Encounter: Payer: Self-pay | Admitting: Internal Medicine

## 2012-11-03 ENCOUNTER — Ambulatory Visit (INDEPENDENT_AMBULATORY_CARE_PROVIDER_SITE_OTHER): Payer: Medicare Other | Admitting: Internal Medicine

## 2012-11-03 VITALS — BP 107/77 | HR 85 | Wt 166.0 lb

## 2012-11-03 DIAGNOSIS — I4891 Unspecified atrial fibrillation: Secondary | ICD-10-CM

## 2012-11-03 DIAGNOSIS — Z95810 Presence of automatic (implantable) cardiac defibrillator: Secondary | ICD-10-CM

## 2012-11-03 LAB — ICD DEVICE OBSERVATION
AL IMPEDENCE ICD: 475 Ohm
BAMS-0003: 80 {beats}/min
LV LEAD IMPEDENCE ICD: 228 Ohm
TZAT-0001FASTVT: 1
TZAT-0001SLOWVT: 1
TZAT-0001SLOWVT: 2
TZAT-0013SLOWVT: 3
TZAT-0018SLOWVT: NEGATIVE
TZON-0003AFLUTTER: 350.8 ms
TZON-0003FASTVT: 259.7 ms
TZON-0003VSLOWVT: 419.5 ms
TZON-0004SLOWVT: 32
TZON-0004VSLOWVT: 36
TZON-0005SLOWVT: 12
TZST-0001FASTVT: 3
TZST-0001FASTVT: 4
TZST-0001FASTVT: 5
TZST-0001FASTVT: 6
TZST-0001SLOWVT: 5
TZST-0003SLOWVT: 35 J
TZST-0003SLOWVT: 35 J

## 2012-11-03 NOTE — Assessment & Plan Note (Signed)
The patient has reverted back to sinus rhythm on amiodarone therapy. I reprogrammed his ICD to the DDD mode. We can follow his atrial fibrillation. At this point I would continue his current dose of amiodarone. I do not think he would be a good candidate for either A. Fib ablation or AV node ablation unless his symptoms of atrial fibrillation worsen.

## 2012-11-03 NOTE — Assessment & Plan Note (Addendum)
His [employer] biventricular ICD remains stable and function. I've encouraged the patient to reduce his salt intake. I've encouraged the patient to followup with his primary cardiologist as needed, but will be available for additional evaluation if indicated. The patient's fluid index is elevated today. I've asked him to reduce his salt intake and take an additional Lasix tablet for the next 2 days.

## 2012-11-03 NOTE — Patient Instructions (Addendum)
Your physician recommends that you schedule a follow-up appointment as needed  Keep your scheduled follow up with Dr Alanda Amass  Your physician has recommended you make the following change in your medication:  1) Take 2 of your fluid pills for the next two days then go back to once daily 2) Continue Amiodarone as taking

## 2012-11-03 NOTE — Progress Notes (Addendum)
HPI [name] is referred back today by Dr. Alanda Amass for evaluation of atrial fibrillation. The patient has had a long h/o atrial fibrillation and chronic systolic CHF in the setting of LBBB. He is s/p BiV ICD implant. In the interim, he has had more atrial fib and underwent DCCV in [place] which was unsuccessful. He was placed back on full does amiodarone by Dr. Donney Rankins after his clinic visit demonstrated atrial fib. Since then his symptoms have improved. He has rare dizziness. No ICD shocks.His blood pressure and CHF symptoms have otherwise been fairly well controlled. He notes mild fatigue and dyspnea with exertion. He denies recent weight gain Allergies  Allergen Reactions  . Penicillins     REACTION: Rash     Current Outpatient Prescriptions  Medication Sig Dispense Refill  . acetaminophen (TYLENOL) 500 MG tablet Take 1,000 mg by mouth every 6 (six) hours as needed for pain.      Marland Kitchen allopurinol (ZYLOPRIM) 100 MG tablet Take 1 tablet by mouth every evening.       Marland Kitchen amiodarone (PACERONE) 200 MG tablet Take 100 mg by mouth every evening.      Marland Kitchen apixaban (ELIQUIS) 5 MG TABS tablet Take 5 mg by mouth 2 (two) times daily.      Marland Kitchen atorvastatin (LIPITOR) 20 MG tablet Take 20 mg by mouth daily.      . Cholecalciferol 10000 UNITS CAPS Take 1 capsule by mouth daily.      . digoxin (LANOXIN) 0.125 MG tablet Take 0.125 mg by mouth every evening.      . furosemide (LASIX) 40 MG tablet Take 1 tablet (40 mg total) by mouth daily.  30 tablet  0  . metoprolol (LOPRESSOR) 100 MG tablet Take 150 mg by mouth daily.      . ramipril (ALTACE) 2.5 MG capsule Take 2.5 mg by mouth every evening.       No current facility-administered medications for this visit.     Past Medical History  Diagnosis Date  . Hypertension   . Chronic tension headaches   . Tinea cruris   . Tinea unguium   . Hernia, hiatal   . CVA (cerebral infarction)   . Systolic heart failure     EF 16-10% in 08/2012 (Echo at [facility], [place])  . Bradycardia   . Ventricular tachycardia   . Syncope   . CAD (coronary artery disease)     h/o MI in 2001, admitted for possible STEMI at [facility] in 08/2012 - Cardiac cath at [facility] in 08/2012: Nonobstructive coronary artery disease  . Sleep disorder   . Atrial fibrillation     On Eliquis, new onset in 08/2012 - successful cardioversion to sinus rhythm  . Automatic implantable cardioverter-defibrillator in situ   . Pacemaker     ICD/PACEMAKER   . Arthritis     HANDS    ROS:   All systems reviewed and negative except as noted in the HPI.   Past Surgical History  Procedure Laterality Date  . Vasectomy    . Icd implantation  06/12/08    Medtronic biventricular ICD, remote - no, Dr. Ladona Ridgel  . Cardiac catheterization  02/03/03    Dr. Jenne Campus   . Insert / replace / remove pacemaker      pacer/ICD     Family History  Problem Relation Age of Onset  . Cancer Mother     unclear what type  . Cancer Father     stomach  History   Social History  . Marital Status: Married    Spouse Name: N/A    Number of Children: N/A  . Years of Education: N/A   Occupational History  . Retired    Social History Main Topics  . Smoking status: Passive Smoke Exposure - Never Smoker  . Smokeless tobacco: Never Used     Comment: denies tobacco use   . Alcohol Use: No  . Drug Use: No  . Sexually Active: Not on file   Other Topics Concern  . Not on file   Social History Narrative   Married and lives in [place].     BP 107/77  Pulse 85  Wt 166 lb (75.297 kg)  BMI 23.16 kg/m2  Physical Exam:  Well appearing NAD HEENT: Unremarkable Neck:  7 cm JVD, no thyromegally Back:  No CVA tenderness Lungs:  Clear with no wheezes, rales, or rhonchi HEART:  Regular rate rhythm, no murmurs, no rubs, no clicks Abd:  soft, positive bowel sounds, no organomegally, no rebound, no guarding Ext:  2 plus pulses, no edema, no cyanosis, no clubbing Skin:  No rashes no  nodules Neuro:  CN II through XII intact, motor grossly intact    DEVICE  Normal device function.  See PaceArt for details.   Assess/Plan:

## 2012-11-03 NOTE — Progress Notes (Signed)
AT/AF on 10/29/12 was 2.4% of time, today it's 1.2% of time---longest episode 46sec. Changed mode from DDIR to DDDR per Dr. Ladona Ridgel. Pt device followed by Dr. Alanda Amass. Nathan Mckinney

## 2012-11-24 ENCOUNTER — Other Ambulatory Visit: Payer: Self-pay

## 2012-11-28 ENCOUNTER — Inpatient Hospital Stay (HOSPITAL_COMMUNITY)
Admission: EM | Admit: 2012-11-28 | Discharge: 2012-12-02 | DRG: 292 | Disposition: A | Discharge status: Home Health | Payer: Medicare Other | Attending: Cardiovascular Disease | Admitting: Cardiovascular Disease

## 2012-11-28 ENCOUNTER — Encounter (HOSPITAL_COMMUNITY): Payer: Self-pay | Admitting: *Deleted

## 2012-11-28 ENCOUNTER — Emergency Department (HOSPITAL_COMMUNITY): Payer: Medicare Other

## 2012-11-28 DIAGNOSIS — N183 Chronic kidney disease, stage 3 unspecified: Secondary | ICD-10-CM

## 2012-11-28 DIAGNOSIS — I252 Old myocardial infarction: Secondary | ICD-10-CM

## 2012-11-28 DIAGNOSIS — I2510 Atherosclerotic heart disease of native coronary artery without angina pectoris: Secondary | ICD-10-CM | POA: Diagnosis present

## 2012-11-28 DIAGNOSIS — F3289 Other specified depressive episodes: Secondary | ICD-10-CM | POA: Diagnosis present

## 2012-11-28 DIAGNOSIS — I4729 Other ventricular tachycardia: Secondary | ICD-10-CM | POA: Diagnosis present

## 2012-11-28 DIAGNOSIS — Z8673 Personal history of transient ischemic attack (TIA), and cerebral infarction without residual deficits: Secondary | ICD-10-CM

## 2012-11-28 DIAGNOSIS — Z95810 Presence of automatic (implantable) cardiac defibrillator: Secondary | ICD-10-CM

## 2012-11-28 DIAGNOSIS — F0280 Dementia in other diseases classified elsewhere without behavioral disturbance: Secondary | ICD-10-CM

## 2012-11-28 DIAGNOSIS — M19049 Primary osteoarthritis, unspecified hand: Secondary | ICD-10-CM | POA: Diagnosis present

## 2012-11-28 DIAGNOSIS — R1011 Right upper quadrant pain: Secondary | ICD-10-CM

## 2012-11-28 DIAGNOSIS — G479 Sleep disorder, unspecified: Secondary | ICD-10-CM | POA: Diagnosis present

## 2012-11-28 DIAGNOSIS — N184 Chronic kidney disease, stage 4 (severe): Secondary | ICD-10-CM | POA: Diagnosis present

## 2012-11-28 DIAGNOSIS — F329 Major depressive disorder, single episode, unspecified: Secondary | ICD-10-CM | POA: Diagnosis present

## 2012-11-28 DIAGNOSIS — I4819 Other persistent atrial fibrillation: Secondary | ICD-10-CM | POA: Diagnosis present

## 2012-11-28 DIAGNOSIS — I472 Ventricular tachycardia, unspecified: Secondary | ICD-10-CM | POA: Diagnosis present

## 2012-11-28 DIAGNOSIS — I959 Hypotension, unspecified: Secondary | ICD-10-CM | POA: Diagnosis present

## 2012-11-28 DIAGNOSIS — R5383 Other fatigue: Secondary | ICD-10-CM

## 2012-11-28 DIAGNOSIS — I5043 Acute on chronic combined systolic (congestive) and diastolic (congestive) heart failure: Secondary | ICD-10-CM

## 2012-11-28 DIAGNOSIS — I5023 Acute on chronic systolic (congestive) heart failure: Principal | ICD-10-CM

## 2012-11-28 DIAGNOSIS — I129 Hypertensive chronic kidney disease with stage 1 through stage 4 chronic kidney disease, or unspecified chronic kidney disease: Secondary | ICD-10-CM | POA: Diagnosis present

## 2012-11-28 DIAGNOSIS — G44229 Chronic tension-type headache, not intractable: Secondary | ICD-10-CM | POA: Diagnosis present

## 2012-11-28 DIAGNOSIS — Z7901 Long term (current) use of anticoagulants: Secondary | ICD-10-CM

## 2012-11-28 DIAGNOSIS — I428 Other cardiomyopathies: Secondary | ICD-10-CM

## 2012-11-28 DIAGNOSIS — Z87442 Personal history of urinary calculi: Secondary | ICD-10-CM

## 2012-11-28 DIAGNOSIS — I6350 Cerebral infarction due to unspecified occlusion or stenosis of unspecified cerebral artery: Secondary | ICD-10-CM | POA: Diagnosis present

## 2012-11-28 DIAGNOSIS — I4891 Unspecified atrial fibrillation: Secondary | ICD-10-CM

## 2012-11-28 DIAGNOSIS — F17200 Nicotine dependence, unspecified, uncomplicated: Secondary | ICD-10-CM | POA: Diagnosis present

## 2012-11-28 DIAGNOSIS — I509 Heart failure, unspecified: Secondary | ICD-10-CM | POA: Diagnosis present

## 2012-11-28 DIAGNOSIS — Z66 Do not resuscitate: Secondary | ICD-10-CM | POA: Diagnosis present

## 2012-11-28 HISTORY — DX: Heart failure, unspecified: I50.9

## 2012-11-28 HISTORY — DX: Angina pectoris, unspecified: I20.9

## 2012-11-28 HISTORY — DX: Major depressive disorder, single episode, unspecified: F32.9

## 2012-11-28 HISTORY — DX: Pneumonia, unspecified organism: J18.9

## 2012-11-28 HISTORY — DX: Depression, unspecified: F32.A

## 2012-11-28 LAB — CBC WITH DIFFERENTIAL/PLATELET
Basophils Absolute: 0 10*3/uL (ref 0.0–0.1)
Eosinophils Absolute: 0.2 10*3/uL (ref 0.0–0.7)
Eosinophils Relative: 2 % (ref 0–5)
HCT: 42.4 % (ref 39.0–52.0)
Lymphocytes Relative: 10 % — ABNORMAL LOW (ref 12–46)
MCH: 30 pg (ref 26.0–34.0)
MCHC: 34 g/dL (ref 30.0–36.0)
MCV: 88.3 fL (ref 78.0–100.0)
Monocytes Absolute: 0.7 10*3/uL (ref 0.1–1.0)
RDW: 15.6 % — ABNORMAL HIGH (ref 11.5–15.5)
WBC: 8.2 10*3/uL (ref 4.0–10.5)

## 2012-11-28 LAB — DIGOXIN LEVEL: Digoxin Level: 0.9 ng/mL (ref 0.8–2.0)

## 2012-11-28 LAB — COMPREHENSIVE METABOLIC PANEL
AST: 31 U/L (ref 0–37)
CO2: 25 mEq/L (ref 19–32)
Calcium: 9.4 mg/dL (ref 8.4–10.5)
Creatinine, Ser: 1.72 mg/dL — ABNORMAL HIGH (ref 0.50–1.35)
GFR calc Af Amer: 44 mL/min — ABNORMAL LOW (ref >90)
GFR calc non Af Amer: 38 mL/min — ABNORMAL LOW (ref >90)
Total Protein: 6.7 g/dL (ref 6.0–8.3)

## 2012-11-28 NOTE — ED Notes (Addendum)
Pt states that he was SOB upon sitting in his chair. Pt states that excertion increases his SOB and the short walk from wait room to triage increased his SOB significantly. Pt states no increase in swelling, or decrease in urinary output. pt does have heart hx with a-fib/flutter chf and pacemaker/defib.  Pt states that he is having pain in the right side of this chest that is 9/10 pressure.

## 2012-11-28 NOTE — ED Notes (Signed)
Alarms checked and adjusted.  Pt alert no distress

## 2012-11-28 NOTE — ED Provider Notes (Signed)
CSN: [id number]     Arrival date & time 11/28/12  2042 History     First MD Initiated Contact with Patient 11/28/12 2128    History is obtained from patient and from patient's wife Chief Complaint  Patient presents with  . Shortness of Breath   (Consider location/radiation/quality/duration/timing/severity/associated sxs/prior Treatment) HPI Patient reports right flank pain for one week. No pain presently pain is worse when he changes position. He also reports shortness of breath for approximately 2 weeks. He was recently hospitalized for congestive heart failure. Also admits to cough, nonproductive no fever. No treatment prior to coming here. He denies flank pain now, no treatment prior to coming here shortness of breath is unchanged for the past 2 weeks. Past Medical History  Diagnosis Date  . Hypertension   . Chronic tension headaches   . Tinea cruris   . Tinea unguium   . Hernia, hiatal   . CVA (cerebral infarction)   . Systolic heart failure     EF 40-98% in 08/2012 (Echo at [facility], [place])  . Bradycardia   . Ventricular tachycardia   . Syncope   . CAD (coronary artery disease)     h/o MI in 2001, admitted for possible STEMI at [facility] in 08/2012 - Cardiac cath at [facility] in 08/2012: Nonobstructive coronary artery disease  . Sleep disorder   . Atrial fibrillation     On Eliquis, new onset in 08/2012 - successful cardioversion to sinus rhythm  . Automatic implantable cardioverter-defibrillator in situ   . Pacemaker     ICD/PACEMAKER   . Arthritis     HANDS  . CHF (congestive heart failure)    Past Surgical History  Procedure Laterality Date  . Vasectomy    . Icd implantation  06/12/08    Medtronic biventricular ICD, remote - no, Dr. Ladona Ridgel  . Cardiac catheterization  02/03/03    Dr. Jenne Campus   . Insert / replace / remove pacemaker      pacer/ICD   Family History  Problem Relation Age of Onset  . Cancer Mother     unclear what type  . Cancer Father     stomach   History  Substance Use Topics  . Smoking status: Passive Smoke Exposure - Never Smoker  . Smokeless tobacco: Never Used     Comment: denies tobacco use   . Alcohol Use: No    Review of Systems  Respiratory: Positive for cough and shortness of breath.   Genitourinary: Positive for flank pain.  All other systems reviewed and are negative.    Allergies  Penicillins  Home Medications   Current Outpatient Rx  Name  Route  Sig  Dispense  Refill  . acetaminophen (TYLENOL) 500 MG tablet   Oral   Take 1,000 mg by mouth every 6 (six) hours as needed for pain.         Marland Kitchen allopurinol (ZYLOPRIM) 100 MG tablet   Oral   Take 1 tablet by mouth every evening.          Marland Kitchen amiodarone (PACERONE) 200 MG tablet   Oral   Take 200 mg by mouth 2 (two) times daily.          Marland Kitchen apixaban (ELIQUIS) 5 MG TABS tablet   Oral   Take 5 mg by mouth 2 (two) times daily.         Marland Kitchen atorvastatin (LIPITOR) 20 MG tablet   Oral   Take 20 mg by mouth daily.         Marland Kitchen  Cholecalciferol 10000 UNITS CAPS   Oral   Take 1 capsule by mouth daily.         . digoxin (LANOXIN) 0.125 MG tablet   Oral   Take 0.125 mg by mouth every evening.         . donepezil (ARICEPT) 10 MG tablet   Oral   Take 10 mg by mouth daily.         . furosemide (LASIX) 40 MG tablet   Oral   Take 1 tablet (40 mg total) by mouth daily.   30 tablet   0   . metoprolol (LOPRESSOR) 100 MG tablet   Oral   Take 150 mg by mouth daily.         . ramipril (ALTACE) 2.5 MG capsule   Oral   Take 2.5 mg by mouth every evening.          BP 117/79  Pulse 105  Temp(Src) 98.4 F (36.9 C) (Oral)  Resp 16  SpO2 95% Physical Exam  Nursing note and vitals reviewed. Constitutional: He appears well-developed and well-nourished. No distress.  HENT:  Head: Normocephalic and atraumatic.  Eyes: Conjunctivae are normal. Pupils are equal, round, and reactive to light.  Neck: Neck supple. No tracheal deviation  present. No thyromegaly present.  Cardiovascular: Normal rate and regular rhythm.   No murmur heard. Pulmonary/Chest: Effort normal and breath sounds normal. No respiratory distress.  Rales at bases bilaterally  Abdominal: Soft. Bowel sounds are normal. He exhibits no distension. There is no tenderness.  Genitourinary: Penis normal.  Normal male genitalia no flank tenderness  Musculoskeletal: Normal range of motion. He exhibits no edema and no tenderness.  Neurological: He is alert. Coordination normal.  Skin: Skin is warm and dry. No rash noted.  Psychiatric: He has a normal mood and affect.    ED Course   Procedures (including critical care time)  Labs Reviewed  CBC WITH DIFFERENTIAL  COMPREHENSIVE METABOLIC PANEL   Dg Chest 2 View  11/28/2012   *RADIOLOGY REPORT*  Clinical Data: Short of breath  CHEST - 2 VIEW  Comparison: 10/21/2012  Findings: Moderate cardiomegaly.  Left subclavian AICD device and leads are stable. Small bilateral pleural effusions.  Mild bibasilar pulmonary opacities compatible with atelectasis verses airspace disease.  No sign of pulmonary edema.  No pneumothorax  IMPRESSION: Cardiomegaly without edema.  Small pleural effusions and minimal bibasilar atelectasis verses airspace disease.   Original Report Authenticated By: Jolaine Click, M.D.   No diagnosis found.  Date: 11/28/2012  Rate: 105  Rhythm: sinus tachycardia  QRS Axis: normal  Intervals: normal  ST/T Wave abnormalities: nonspecific T wave changes  Conduction Disutrbances:none  Narrative Interpretation:   Old EKG Reviewed: Rate somewhat increased over previous tracing from 11/06/2012, otherwise unchanged interpreted by me Results for orders placed during the hospital encounter of 11/28/12  CBC WITH DIFFERENTIAL      Result Value Range   WBC 8.2  4.0 - 10.5 K/uL   RBC 4.80  4.22 - 5.81 MIL/uL   Hemoglobin 14.4  13.0 - 17.0 g/dL   HCT 45.4  09.8 - 11.9 %   MCV 88.3  78.0 - 100.0 fL   MCH 30.0   26.0 - 34.0 pg   MCHC 34.0  30.0 - 36.0 g/dL   RDW 14.7 (*) 82.9 - 56.2 %   Platelets 172  150 - 400 K/uL   Neutrophils Relative % 79 (*) 43 - 77 %   Neutro Abs 6.4  1.7 -  7.7 K/uL   Lymphocytes Relative 10 (*) 12 - 46 %   Lymphs Abs 0.9  0.7 - 4.0 K/uL   Monocytes Relative 9  3 - 12 %   Monocytes Absolute 0.7  0.1 - 1.0 K/uL   Eosinophils Relative 2  0 - 5 %   Eosinophils Absolute 0.2  0.0 - 0.7 K/uL   Basophils Relative 1  0 - 1 %   Basophils Absolute 0.0  0.0 - 0.1 K/uL  COMPREHENSIVE METABOLIC PANEL      Result Value Range   Sodium 143  135 - 145 mEq/L   Potassium 4.8  3.5 - 5.1 mEq/L   Chloride 106  96 - 112 mEq/L   CO2 25  19 - 32 mEq/L   Glucose, Bld 112 (*) 70 - 99 mg/dL   BUN 25 (*) 6 - 23 mg/dL   Creatinine, Ser 6.21 (*) 0.50 - 1.35 mg/dL   Calcium 9.4  8.4 - 30.8 mg/dL   Total Protein 6.7  6.0 - 8.3 g/dL   Albumin 3.5  3.5 - 5.2 g/dL   AST 31  0 - 37 U/L   ALT 23  0 - 53 U/L   Alkaline Phosphatase 100  39 - 117 U/L   Total Bilirubin 2.7 (*) 0.3 - 1.2 mg/dL   GFR calc non Af Amer 38 (*) >90 mL/min   GFR calc Af Amer 44 (*) >90 mL/min  PRO B NATRIURETIC PEPTIDE      Result Value Range   Pro B Natriuretic peptide (BNP) 33948.0 (*) 0 - 125 pg/mL  DIGOXIN LEVEL      Result Value Range   Digoxin Level 0.9  0.8 - 2.0 ng/mL  POCT I-STAT TROPONIN I      Result Value Range   Troponin i, poc 0.05  0.00 - 0.08 ng/mL   Comment 3            Dg Chest 2 View  11/28/2012   *RADIOLOGY REPORT*  Clinical Data: Short of breath  CHEST - 2 VIEW  Comparison: 10/21/2012  Findings: Moderate cardiomegaly.  Left subclavian AICD device and leads are stable. Small bilateral pleural effusions.  Mild bibasilar pulmonary opacities compatible with atelectasis verses airspace disease.  No sign of pulmonary edema.  No pneumothorax  IMPRESSION: Cardiomegaly without edema.  Small pleural effusions and minimal bibasilar atelectasis verses airspace disease.   Original Report Authenticated By: Jolaine Click, M.D.   Chest xraty reviewed by me MDM  In light of markedly elevated BNP in comparison to 10/31/2012  patient in mild congestive heart failure. Plan we'll gently diurese. I've spoken with Dr. Julian Reil plan admit telemetry Diagnosis #1 congestive heart failure #2 renal insufficiency 3 flank pain    Doug Sou, MD 11/29/12 0105

## 2012-11-29 ENCOUNTER — Inpatient Hospital Stay (HOSPITAL_COMMUNITY): Payer: Medicare Other

## 2012-11-29 ENCOUNTER — Encounter (HOSPITAL_COMMUNITY): Payer: Self-pay | Admitting: General Practice

## 2012-11-29 DIAGNOSIS — I509 Heart failure, unspecified: Secondary | ICD-10-CM

## 2012-11-29 DIAGNOSIS — Z95810 Presence of automatic (implantable) cardiac defibrillator: Secondary | ICD-10-CM

## 2012-11-29 DIAGNOSIS — I5043 Acute on chronic combined systolic (congestive) and diastolic (congestive) heart failure: Secondary | ICD-10-CM

## 2012-11-29 DIAGNOSIS — N183 Chronic kidney disease, stage 3 unspecified: Secondary | ICD-10-CM

## 2012-11-29 DIAGNOSIS — R5381 Other malaise: Secondary | ICD-10-CM

## 2012-11-29 DIAGNOSIS — R1011 Right upper quadrant pain: Secondary | ICD-10-CM

## 2012-11-29 DIAGNOSIS — I4891 Unspecified atrial fibrillation: Secondary | ICD-10-CM

## 2012-11-29 LAB — BASIC METABOLIC PANEL
CO2: 23 mEq/L (ref 19–32)
Calcium: 9.2 mg/dL (ref 8.4–10.5)
Chloride: 105 mEq/L (ref 96–112)
Creatinine, Ser: 1.84 mg/dL — ABNORMAL HIGH (ref 0.50–1.35)
Glucose, Bld: 114 mg/dL — ABNORMAL HIGH (ref 70–99)

## 2012-11-29 LAB — TROPONIN I
Troponin I: <0.3 ng/mL (ref <0.30)
Troponin I: <0.3 ng/mL (ref <0.30)
Troponin I: <0.3 ng/mL (ref <0.30)

## 2012-11-29 LAB — URINALYSIS, ROUTINE W REFLEX MICROSCOPIC
Glucose, UA: NEGATIVE mg/dL
Leukocytes, UA: NEGATIVE
Protein, ur: NEGATIVE mg/dL
Urobilinogen, UA: 0.2 mg/dL (ref 0.0–1.0)

## 2012-11-29 LAB — MAGNESIUM: Magnesium: 2 mg/dL (ref 1.5–2.5)

## 2012-11-29 MED ORDER — APIXABAN 5 MG PO TABS
5.0000 mg | ORAL_TABLET | Freq: Two times a day (BID) | ORAL | Status: DC
  Administered 2012-11-29 – 2012-12-02 (×7): 5 mg via ORAL
  Filled 2012-11-29 (×8): qty 1

## 2012-11-29 MED ORDER — DONEPEZIL HCL 10 MG PO TABS
10.0000 mg | ORAL_TABLET | Freq: Every day | ORAL | Status: DC
  Administered 2012-11-29 – 2012-12-01 (×3): 10 mg via ORAL
  Filled 2012-11-29 (×4): qty 1

## 2012-11-29 MED ORDER — FUROSEMIDE 40 MG PO TABS
40.0000 mg | ORAL_TABLET | Freq: Every day | ORAL | Status: DC
  Filled 2012-11-29: qty 1

## 2012-11-29 MED ORDER — AMIODARONE HCL 200 MG PO TABS
200.0000 mg | ORAL_TABLET | Freq: Two times a day (BID) | ORAL | Status: DC
  Administered 2012-11-29 – 2012-12-02 (×8): 200 mg via ORAL
  Filled 2012-11-29 (×9): qty 1

## 2012-11-29 MED ORDER — SODIUM CHLORIDE 0.9 % IV SOLN: 250.0000 mL | INTRAVENOUS | Status: DC | PRN

## 2012-11-29 MED ORDER — ALLOPURINOL 100 MG PO TABS
100.0000 mg | ORAL_TABLET | Freq: Every evening | ORAL | Status: DC
  Administered 2012-11-29 – 2012-12-01 (×3): 100 mg via ORAL
  Filled 2012-11-29 (×4): qty 1

## 2012-11-29 MED ORDER — METOPROLOL TARTRATE 50 MG PO TABS
150.0000 mg | ORAL_TABLET | Freq: Every day | ORAL | Status: DC
  Filled 2012-11-29: qty 1

## 2012-11-29 MED ORDER — ACETAMINOPHEN 500 MG PO TABS
1000.0000 mg | ORAL_TABLET | Freq: Four times a day (QID) | ORAL | Status: DC | PRN
  Filled 2012-11-29: qty 2

## 2012-11-29 MED ORDER — SODIUM CHLORIDE 0.9 % IJ SOLN: 3.0000 mL | INTRAMUSCULAR | Status: DC | PRN

## 2012-11-29 MED ORDER — RAMIPRIL 2.5 MG PO CAPS
2.5000 mg | ORAL_CAPSULE | Freq: Every evening | ORAL | Status: DC
  Administered 2012-11-29 – 2012-12-01 (×3): 2.5 mg via ORAL
  Filled 2012-11-29 (×4): qty 1

## 2012-11-29 MED ORDER — ONDANSETRON HCL 4 MG/2ML IJ SOLN: 4.0000 mg | Freq: Four times a day (QID) | INTRAMUSCULAR | Status: DC | PRN

## 2012-11-29 MED ORDER — FUROSEMIDE 10 MG/ML IJ SOLN: 40.0000 mg | Freq: Once | INTRAMUSCULAR | Status: DC

## 2012-11-29 MED ORDER — ACETAMINOPHEN 325 MG PO TABS: 650.0000 mg | ORAL_TABLET | ORAL | Status: DC | PRN

## 2012-11-29 MED ORDER — METOPROLOL TARTRATE 100 MG PO TABS
100.0000 mg | ORAL_TABLET | Freq: Every day | ORAL | Status: DC
  Administered 2012-11-29 – 2012-12-02 (×3): 100 mg via ORAL
  Filled 2012-11-29 (×4): qty 1

## 2012-11-29 MED ORDER — FUROSEMIDE 10 MG/ML IJ SOLN
40.0000 mg | Freq: Once | INTRAMUSCULAR | Status: AC
  Administered 2012-11-29: 40 mg via INTRAVENOUS
  Filled 2012-11-29: qty 4

## 2012-11-29 MED ORDER — FUROSEMIDE 10 MG/ML IJ SOLN
80.0000 mg | Freq: Two times a day (BID) | INTRAMUSCULAR | Status: DC
  Administered 2012-11-29 – 2012-12-01 (×4): 80 mg via INTRAVENOUS
  Filled 2012-11-29 (×5): qty 8

## 2012-11-29 MED ORDER — SODIUM CHLORIDE 0.9 % IJ SOLN
3.0000 mL | Freq: Two times a day (BID) | INTRAMUSCULAR | Status: DC
  Administered 2012-11-29 – 2012-12-02 (×7): 3 mL via INTRAVENOUS

## 2012-11-29 MED ORDER — DIGOXIN 125 MCG PO TABS
0.1250 mg | ORAL_TABLET | Freq: Every evening | ORAL | Status: DC
  Administered 2012-11-29 – 2012-12-01 (×4): 0.125 mg via ORAL
  Filled 2012-11-29 (×5): qty 1

## 2012-11-29 MED ORDER — ATORVASTATIN CALCIUM 20 MG PO TABS
20.0000 mg | ORAL_TABLET | Freq: Every day | ORAL | Status: DC
  Administered 2012-11-29 – 2012-12-02 (×4): 20 mg via ORAL
  Filled 2012-11-29 (×4): qty 1

## 2012-11-29 NOTE — Progress Notes (Signed)
[facility]                                                                                Patient Demographics  Nathan Mckinney, is a 72 y.o. male, DOB - 29-May-1940, ZOX:[id number], WJX:[id number]  Admit date - 11/28/2012  Admitting Physician Hillary Bow, DO  Outpatient Primary MD for the patient is Pcp Not In System  LOS - 1   Chief Complaint  Patient presents with  . Shortness of Breath        Assessment & Plan    1. Acute on chronic systolic heart failure - latest EF 15 to 20 % on recent echo gram, patient has AICD, cardiology is primary now and they're following the patient, defer management of this problem to cardiology.   2. History of atrial fibrillation. Goal will be rate control he is on beta blocker, digoxin along with amiodarone and Eliquis, continue to monitor, nonsustained V. tach on it 03/10/2013, again continue on beta blocker and amiodarone, has AICD, adult the primary team following, will defer cardiology issues to the primary team.   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 Baseline creatinine around 1.7, for now close to baseline we'll continue to monitor. Can be gently diuresed with close renal function monitoring.   4. Recent right-sided flank pain. Question etiology, says he has had renal stones in the past, will check abdominal ultrasound and monitor.   5. Mildly elevated total bilirubin. Abdominal ultrasound ordered. Monitor results. Repeat CMP in the morning.     Code Status: DNR  Family Communication: none present  Disposition Plan: home   Procedures ultrasound abdomen ordered   Consults neurology now primary team hospitalist will follow the patient for medical issues   DVT Prophylaxis Eliquis  Lab Results  Component Value Date   PLT 172 11/28/2012    Medications  Scheduled Meds: . allopurinol  100 mg Oral QPM  . amiodarone  200 mg Oral BID  . apixaban  5 mg Oral BID  . atorvastatin  20 mg Oral Daily  . digoxin  0.125 mg Oral QPM  .  donepezil  10 mg Oral QHS  . furosemide  40 mg Oral Daily  . metoprolol  100 mg Oral Daily  . ramipril  2.5 mg Oral QPM  . sodium chloride  3 mL Intravenous Q12H   Continuous Infusions:  PRN Meds:.sodium chloride, acetaminophen, acetaminophen, ondansetron (ZOFRAN) IV, sodium chloride  Antibiotics    Anti-infectives   None       Time Spent in minutes   35   Kishan Wachsmuth K M.D on 11/29/2012 at 1:25 PM  Between 7am to 7pm - Pager - [phone]  After 7pm go to [email or web address] - password [email or web address]  And look for the night coverage person covering for me after hours  [facility] Office  [phone]    Subjective:   Burnett Spray today has, No headache, No chest pain, No abdominal pain , resolved right-sided flank pain- No Nausea, No new weakness tingling or numbness, No Cough - mild SOB.   Objective:   Filed Vitals:   11/28/12 2334 11/29/12 0205 11/29/12 0319 11/29/12 0937  BP: 113/95 133/85  105/75  Pulse: 93 111  112 74  Temp: 97.5 F (36.4 C) 97.3 F (36.3 C)    TempSrc: Oral Oral    Resp: 22 21    Height:  5\' 11"  (1.803 m)    Weight:  78.563 kg (173 lb 3.2 oz)    SpO2: 95% 93%      Wt Readings from Last 3 Encounters:  11/29/12 78.563 kg (173 lb 3.2 oz)  11/03/12 75.297 kg (166 lb)  10/23/12 75.025 kg (165 lb 6.4 oz)     Intake/Output Summary (Last 24 hours) at 11/29/12 1325 Last data filed at 11/29/12 1303  Gross per 24 hour  Intake    303 ml  Output    400 ml  Net    -97 ml    Exam Awake Alert, Oriented X 3, No new F.N deficits, Normal affect [place].AT,PERRAL Supple Neck,No JVD, No cervical lymphadenopathy appriciated.  Symmetrical Chest wall movement, Good air movement bilaterally, few rales RRR,No Gallops,Rubs or new Murmurs, No Parasternal Heave +ve B.Sounds, Abd Soft, Non tender, No organomegaly appriciated, No rebound - guarding or rigidity. No Cyanosis, Clubbing or edema, No new Rash or bruise     Data Review   Micro Results No  results found for this or any previous visit (from the past 240 hour(s)).  Radiology Reports Dg Chest 2 View  11/28/2012   *RADIOLOGY REPORT*  Clinical Data: Short of breath  CHEST - 2 VIEW  Comparison: 10/21/2012  Findings: Moderate cardiomegaly.  Left subclavian AICD device and leads are stable. Small bilateral pleural effusions.  Mild bibasilar pulmonary opacities compatible with atelectasis verses airspace disease.  No sign of pulmonary edema.  No pneumothorax  IMPRESSION: Cardiomegaly without edema.  Small pleural effusions and minimal bibasilar atelectasis verses airspace disease.   Original Report Authenticated By: Jolaine Click, M.D.    CBC  Recent Labs Lab 11/28/12 2142  WBC 8.2  HGB 14.4  HCT 42.4  PLT 172  MCV 88.3  MCH 30.0  MCHC 34.0  RDW 15.6*  LYMPHSABS 0.9  MONOABS 0.7  EOSABS 0.2  BASOSABS 0.0    Chemistries   Recent Labs Lab 11/28/12 2142 11/29/12 1024  NA 143 140  K 4.8 4.1  CL 106 105  CO2 25 23  GLUCOSE 112* 114*  BUN 25* 30*  CREATININE 1.72* 1.84*  CALCIUM 9.4 9.2  AST 31  --   ALT 23  --   ALKPHOS 100  --   BILITOT 2.7*  --    ------------------------------------------------------------------------------------------------------------------ estimated creatinine clearance is 38.1 ml/min (by C-G formula based on Cr of 1.84). ------------------------------------------------------------------------------------------------------------------ No results found for this basename: HGBA1C,  in the last 72 hours ------------------------------------------------------------------------------------------------------------------ No results found for this basename: CHOL, HDL, LDLCALC, TRIG, CHOLHDL, LDLDIRECT,  in the last 72 hours ------------------------------------------------------------------------------------------------------------------ No results found for this basename: TSH, T4TOTAL, FREET3, T3FREE, THYROIDAB,  in the last 72  hours ------------------------------------------------------------------------------------------------------------------ No results found for this basename: VITAMINB12, FOLATE, FERRITIN, TIBC, IRON, RETICCTPCT,  in the last 72 hours  Coagulation profile No results found for this basename: INR, PROTIME,  in the last 168 hours  No results found for this basename: DDIMER,  in the last 72 hours  Cardiac Enzymes  Recent Labs Lab 11/29/12 0100 11/29/12 0855  TROPONINI <0.30 <0.30   ------------------------------------------------------------------------------------------------------------------ No components found with this basename: POCBNP,

## 2012-11-29 NOTE — Progress Notes (Signed)
The patient arrived to [id number].  The patient was oriented to the unit and placed on telemetry.  VS were taken and the patient was assessed.  An admission history was completed.  The call bell was placed within reach and the bed alarm was turned on.  A heart failure packet was left at the patient's bedside.  The patient does not have any complaints of pain at this time.

## 2012-11-29 NOTE — Consult Note (Signed)
Reason for Consult: Heart failure  Chief Complaint:  RUQ pain and Nausea Referring Physician:   DIANE MOCHIZUKI is an 72 y.o. male.  HPI:   The patient is a 72 year old male with history of atrial fibrillation with failed cardioversion and he takes pradaxa.  History also includes coronary disease, nonischemic cardiomyopathy with ejection fraction of 35-40% status post Tronic BiV ICD upgrade 2013, congestive heart failure, hypertension, bradycardia, syncope.  His last cardiac catheterization was in 2004 showed a 40% proximal and mid right and 50% DX2. His last nuclear stress test in September 2010 was negative.  She was hospitalized in [place] with atrial fibrillation with rapid ventricular response. Apparently he had a 2-D echocardiogram and heart catheterization at that time.  Is recently seen by Dr. Ladona Ridgel who did not think he was a candidate for ablation.  He was also seen on October 29, 2012 by Dr. Alanda Amass who increased his amiodarone.     He presented today with complaints of right upper quadrant pain and nausea. This is been ongoing since being seen in May in [place].  He did not think eating made the pain worse.  He also complains of orthopnea, PND, shortness of breath as well as palpitations and dizziness/lightheadedness.  The patient currently denies vomiting, fever, chest pain, cough, congestion,  hematochezia, melena, hematuria,  lower extremity edema.   Past Medical History  Diagnosis Date  . Hypertension   . Chronic tension headaches   . Tinea cruris   . Tinea unguium   . Hernia, hiatal   . CVA (cerebral infarction)   . Systolic heart failure     EF 16-10% in 08/2012 (Echo at [facility], [place])  . Bradycardia   . Ventricular tachycardia   . Syncope   . CAD (coronary artery disease)     h/o MI in 2001, admitted for possible STEMI at [facility] in 08/2012 - Cardiac cath at [facility] in 08/2012: Nonobstructive coronary artery disease  . Sleep disorder   . Atrial  fibrillation     On Eliquis, new onset in 08/2012 - successful cardioversion to sinus rhythm  . Automatic implantable cardioverter-defibrillator in situ   . Pacemaker     ICD/PACEMAKER   . Arthritis     HANDS  . CHF (congestive heart failure)   . Myocardial infarction   . Anginal pain   . Depression   . Shortness of breath   . Pneumonia     Past Surgical History  Procedure Laterality Date  . Vasectomy    . Icd implantation  06/12/08    Medtronic biventricular ICD, remote - no, Dr. Ladona Ridgel  . Cardiac catheterization  02/03/03    Dr. Jenne Campus   . Insert / replace / remove pacemaker      pacer/ICD    Family History  Problem Relation Age of Onset  . Cancer Mother     unclear what type  . Cancer Father     stomach    Social History:  reports that he has been passively smoking.  He has never used smokeless tobacco. He reports that he does not drink alcohol or use illicit drugs.  Allergies:  Allergies  Allergen Reactions  . Penicillins     REACTION: Rash    Medications:  Prior to Admission medications   Medication Sig Start Date End Date Taking? Authorizing Provider  acetaminophen (TYLENOL) 500 MG tablet Take 1,000 mg by mouth every 6 (six) hours as needed for pain.   Yes Historical Provider, MD  allopurinol (ZYLOPRIM) 100 MG tablet Take 1 tablet by mouth every evening.  12/16/11  Yes Historical Provider, MD  amiodarone (PACERONE) 200 MG tablet Take 200 mg by mouth 2 (two) times daily.    Yes Historical Provider, MD  apixaban (ELIQUIS) 5 MG TABS tablet Take 5 mg by mouth 2 (two) times daily.   Yes Historical Provider, MD  atorvastatin (LIPITOR) 20 MG tablet Take 20 mg by mouth daily.   Yes Historical Provider, MD  Cholecalciferol 10000 UNITS CAPS Take 1 capsule by mouth daily.   Yes Historical Provider, MD  digoxin (LANOXIN) 0.125 MG tablet Take 0.125 mg by mouth every evening.   Yes Historical Provider, MD  donepezil (ARICEPT) 10 MG tablet Take 10 mg by mouth daily.   Yes  Historical Provider, MD  furosemide (LASIX) 40 MG tablet Take 1 tablet (40 mg total) by mouth daily. 10/23/12  Yes Neema Davina Poke, MD  metoprolol (LOPRESSOR) 100 MG tablet Take 150 mg by mouth daily.   Yes Historical Provider, MD  ramipril (ALTACE) 2.5 MG capsule Take 2.5 mg by mouth every evening.   Yes Historical Provider, MD     Results for orders placed during the hospital encounter of 11/28/12 (from the past 48 hour(s))  CBC WITH DIFFERENTIAL     Status: Abnormal   Collection Time    11/28/12  9:42 PM      Result Value Range   WBC 8.2  4.0 - 10.5 K/uL   RBC 4.80  4.22 - 5.81 MIL/uL   Hemoglobin 14.4  13.0 - 17.0 g/dL   HCT 16.1  09.6 - 04.5 %   MCV 88.3  78.0 - 100.0 fL   MCH 30.0  26.0 - 34.0 pg   MCHC 34.0  30.0 - 36.0 g/dL   RDW 40.9 (*) 81.1 - 91.4 %   Platelets 172  150 - 400 K/uL   Neutrophils Relative % 79 (*) 43 - 77 %   Neutro Abs 6.4  1.7 - 7.7 K/uL   Lymphocytes Relative 10 (*) 12 - 46 %   Lymphs Abs 0.9  0.7 - 4.0 K/uL   Monocytes Relative 9  3 - 12 %   Monocytes Absolute 0.7  0.1 - 1.0 K/uL   Eosinophils Relative 2  0 - 5 %   Eosinophils Absolute 0.2  0.0 - 0.7 K/uL   Basophils Relative 1  0 - 1 %   Basophils Absolute 0.0  0.0 - 0.1 K/uL  COMPREHENSIVE METABOLIC PANEL     Status: Abnormal   Collection Time    11/28/12  9:42 PM      Result Value Range   Sodium 143  135 - 145 mEq/L   Potassium 4.8  3.5 - 5.1 mEq/L   Chloride 106  96 - 112 mEq/L   CO2 25  19 - 32 mEq/L   Glucose, Bld 112 (*) 70 - 99 mg/dL   BUN 25 (*) 6 - 23 mg/dL   Creatinine, Ser 7.82 (*) 0.50 - 1.35 mg/dL   Calcium 9.4  8.4 - 95.6 mg/dL   Total Protein 6.7  6.0 - 8.3 g/dL   Albumin 3.5  3.5 - 5.2 g/dL   AST 31  0 - 37 U/L   ALT 23  0 - 53 U/L   Alkaline Phosphatase 100  39 - 117 U/L   Total Bilirubin 2.7 (*) 0.3 - 1.2 mg/dL   GFR calc non Af Amer 38 (*) >90 mL/min   GFR calc  Af Amer 44 (*) >90 mL/min   Comment:            The eGFR has been calculated     using the CKD EPI equation.      This calculation has not been     validated in all clinical     situations.     eGFR's persistently     <90 mL/min signify     possible Chronic Kidney Disease.  POCT I-STAT TROPONIN I     Status: None   Collection Time    11/28/12  9:46 PM      Result Value Range   Troponin i, poc 0.05  0.00 - 0.08 ng/mL   Comment 3            Comment: Due to the release kinetics of cTnI,     a negative result within the first hours     of the onset of symptoms does not rule out     myocardial infarction with certainty.     If myocardial infarction is still suspected,     repeat the test at appropriate intervals.  PRO B NATRIURETIC PEPTIDE     Status: Abnormal   Collection Time    11/28/12  9:47 PM      Result Value Range   Pro B Natriuretic peptide (BNP) 33948.0 (*) 0 - 125 pg/mL  DIGOXIN LEVEL     Status: None   Collection Time    11/28/12  9:54 PM      Result Value Range   Digoxin Level 0.9  0.8 - 2.0 ng/mL  TROPONIN I     Status: None   Collection Time    11/29/12  1:00 AM      Result Value Range   Troponin I <0.30  <0.30 ng/mL   Comment:            Due to the release kinetics of cTnI,     a negative result within the first hours     of the onset of symptoms does not rule out     myocardial infarction with certainty.     If myocardial infarction is still suspected,     repeat the test at appropriate intervals.  URINALYSIS, ROUTINE W REFLEX MICROSCOPIC     Status: None   Collection Time    11/29/12  1:43 AM      Result Value Range   Color, Urine YELLOW  YELLOW   APPearance CLEAR  CLEAR   Specific Gravity, Urine 1.010  1.005 - 1.030   pH 5.5  5.0 - 8.0   Glucose, UA NEGATIVE  NEGATIVE mg/dL   Hgb urine dipstick NEGATIVE  NEGATIVE   Bilirubin Urine NEGATIVE  NEGATIVE   Ketones, ur NEGATIVE  NEGATIVE mg/dL   Protein, ur NEGATIVE  NEGATIVE mg/dL   Urobilinogen, UA 0.2  0.0 - 1.0 mg/dL   Nitrite NEGATIVE  NEGATIVE   Leukocytes, UA NEGATIVE  NEGATIVE   Comment: MICROSCOPIC  NOT DONE ON URINES WITH NEGATIVE PROTEIN, BLOOD, LEUKOCYTES, NITRITE, OR GLUCOSE <1000 mg/dL.  TROPONIN I     Status: None   Collection Time    11/29/12  8:55 AM      Result Value Range   Troponin I <0.30  <0.30 ng/mL   Comment:            Due to the release kinetics of cTnI,     a negative result within the first hours     of  the onset of symptoms does not rule out     myocardial infarction with certainty.     If myocardial infarction is still suspected,     repeat the test at appropriate intervals.    Dg Chest 2 View  11/28/2012   *RADIOLOGY REPORT*  Clinical Data: Short of breath  CHEST - 2 VIEW  Comparison: 10/21/2012  Findings: Moderate cardiomegaly.  Left subclavian AICD device and leads are stable. Small bilateral pleural effusions.  Mild bibasilar pulmonary opacities compatible with atelectasis verses airspace disease.  No sign of pulmonary edema.  No pneumothorax  IMPRESSION: Cardiomegaly without edema.  Small pleural effusions and minimal bibasilar atelectasis verses airspace disease.   Original Report Authenticated By: Jolaine Click, M.D.    Review of Systems  Constitutional: Negative for fever and diaphoresis.  HENT: Negative for congestion and sore throat.   Respiratory: Positive for shortness of breath. Negative for cough.   Cardiovascular: Positive for palpitations, orthopnea and PND. Negative for chest pain and leg swelling.  Gastrointestinal: Positive for nausea and abdominal pain (right upper quadrant). Negative for vomiting, constipation, blood in stool and melena.  Genitourinary: Negative for dysuria and hematuria.  Neurological: Positive for dizziness.   Blood pressure 105/75, pulse 74, temperature 97.3 F (36.3 C), temperature source Oral, resp. rate 21, height 5\' 11"  (1.803 m), weight 173 lb 3.2 oz (78.563 kg), SpO2 93.00%. Physical Exam  Constitutional: He is oriented to person, place, and time. He appears well-developed and well-nourished. No distress.  HENT:   Head: Normocephalic and atraumatic.  Eyes: EOM are normal. Pupils are equal, round, and reactive to light. No scleral icterus.  Neck: Normal range of motion. Neck supple. JVD present.  Cardiovascular: S1 normal and S2 normal.  An irregularly irregular rhythm present.  Murmur heard.  Systolic murmur is present with a grade of 2/6  Pulses:      Radial pulses are 2+ on the right side, and 2+ on the left side.       Posterior tibial pulses are 1+ on the right side, and 1+ on the left side.  Murmur loudest at the apex.  Respiratory: Effort normal and breath sounds normal. He has no wheezes. He has no rales.  GI: Soft. There is tenderness (positive Murphy's). There is guarding.  Bowel sounds hyperactive.  Liver edge palpable  Musculoskeletal: He exhibits no edema.  Is no lower extremity edema  Lymphadenopathy:    He has no cervical adenopathy.  Neurological: He is oriented to person, place, and time. He exhibits normal muscle tone.  Skin: Skin is warm and dry.  Psychiatric: He has a normal mood and affect.    Assessment/Plan: Principal Problem:   Acute on chronic systolic heart failure Active Problems:   Atrial fibrillation   SLEEP DISORDER   CKD (chronic kidney disease) stage 3, GFR 30-59 ml/min  Plan:   BNP is elevated at 33,948 and so is his JVD.  I am surprised at the lack of lower extremity edema given how short of breath he became when I had him lay down flat.  Liver enzymes WNL.  He is very tender in the RUQ.  Does not appear to have hepatic congestion. Lungs are clear and CXR looks clear.   Net fluids:  53ml ??  He did receive 40mg  of IV lasix.  Recheck BNP in AM.  He does not appear in overt CHF.   Interrogate PPM. Completed abd [place] pending.    MD opinion to follow.  Bryana Froemming 11/29/2012,  11:41 AM

## 2012-11-29 NOTE — Progress Notes (Signed)
Pt a/o, oob ad lib, pt denies dizziness, however does c/o SOB with exertion, pt awaiting abd ultrasound and has been NPO since after breakfast, vss, will continue to monitor

## 2012-11-29 NOTE — Consult Note (Addendum)
Pt. Seen and examined. Agree with the NP/PA-C note as written. 72 yo male with low EF of 10-15%, history of AICD s/p BI-V upgrade in 2013 and ongoing a-fib, not deemed an ablation candidate. Main complaints are nausea and RUQ tenderness as well as dyspnea. I agree he has distended neck veins, but is not orthopneic. This may represent low output heart failure with hepatic congestion. RUQ is pending. BNP is very high. Very little recorded out. I would try more aggressive diuresis. Creatinine is climbing which may be cardiorenal syndrome. He may need low dose milrinone.  Will consider advanced HF consult in the am.  We will be happy to assume care of this patient on our service.  Chrystie Nose, MD, [facility] Attending Cardiologist [facility]

## 2012-11-29 NOTE — ED Notes (Signed)
Jacubowitz MD at bedside. 

## 2012-11-29 NOTE — Progress Notes (Signed)
Utilization Review Completed Meranda Dechaine J. Rosina Cressler, RN, BSN, NCM [phone]  

## 2012-11-29 NOTE — Progress Notes (Signed)
Pt has been ringing Vtach on monitor, pt stable, CMT called d/t tele looks to be pacing with underlying block, am tele strip looked to have pacer spike in QRS complex, MD Thedore Mins notified

## 2012-11-29 NOTE — H&P (Signed)
[facility] History and Physical  Nathan Mckinney FAO:[id number] DOB: 06/06/1940 DOA: 11/28/2012  Referring physician: ED PCP: Pcp Not In System  Specialists: [facility]  Chief Complaint: SOB  HPI: Nathan Mckinney is a 72 y.o. male who presents with h/o R flank pain for 1 week, worse with position changes, no pain at this time however.  Patient also reports SOB for approximately 2 weeks, he was recently hospitalized with CHF.  Also admits to cough, non-productive, no fever.  Admits to fatigue, has no treatment prior to arrival in ED for his SOB.  SOB is worse on exertion.  At baseline patient has severe systolic CHF with EF 15-20%, s/p Biventricular pacer / AICD placement, and recent generator replacement of the BiV ICD in November of last year.  Work up in the ED demonstrates his BNP is markedly elevated from baseline and he clinically has rhonchi in the lung bases, however CXR did not show much in the way of pulmonary edema.  Hospitalist is asked to admit for [facility].  Review of Systems: 12 systems reviewed and otherwise negative.  Past Medical History  Diagnosis Date  . Hypertension   . Chronic tension headaches   . Tinea cruris   . Tinea unguium   . Hernia, hiatal   . CVA (cerebral infarction)   . Systolic heart failure     EF 69-62% in 08/2012 (Echo at [facility], [place])  . Bradycardia   . Ventricular tachycardia   . Syncope   . CAD (coronary artery disease)     h/o MI in 2001, admitted for possible STEMI at [facility] in 08/2012 - Cardiac cath at [facility] in 08/2012: Nonobstructive coronary artery disease  . Sleep disorder   . Atrial fibrillation     On Eliquis, new onset in 08/2012 - successful cardioversion to sinus rhythm  . Automatic implantable cardioverter-defibrillator in situ   . Pacemaker     ICD/PACEMAKER   . Arthritis     HANDS  . CHF (congestive heart failure)    Past Surgical History  Procedure Laterality Date  . Vasectomy    . Icd implantation   06/12/08    Medtronic biventricular ICD, remote - no, Dr. Ladona Ridgel  . Cardiac catheterization  02/03/03    Dr. Jenne Campus   . Insert / replace / remove pacemaker      pacer/ICD   Social History:  reports that he has been passively smoking.  He has never used smokeless tobacco. He reports that he does not drink alcohol or use illicit drugs.   Allergies  Allergen Reactions  . Penicillins     REACTION: Rash    Family History  Problem Relation Age of Onset  . Cancer Mother     unclear what type  . Cancer Father     stomach    Prior to Admission medications   Medication Sig Start Date End Date Taking? Authorizing Provider  acetaminophen (TYLENOL) 500 MG tablet Take 1,000 mg by mouth every 6 (six) hours as needed for pain.   Yes Historical Provider, MD  allopurinol (ZYLOPRIM) 100 MG tablet Take 1 tablet by mouth every evening.  12/16/11  Yes Historical Provider, MD  amiodarone (PACERONE) 200 MG tablet Take 200 mg by mouth 2 (two) times daily.    Yes Historical Provider, MD  apixaban (ELIQUIS) 5 MG TABS tablet Take 5 mg by mouth 2 (two) times daily.   Yes Historical Provider, MD  atorvastatin (LIPITOR) 20 MG tablet Take 20 mg by  mouth daily.   Yes Historical Provider, MD  Cholecalciferol 10000 UNITS CAPS Take 1 capsule by mouth daily.   Yes Historical Provider, MD  digoxin (LANOXIN) 0.125 MG tablet Take 0.125 mg by mouth every evening.   Yes Historical Provider, MD  donepezil (ARICEPT) 10 MG tablet Take 10 mg by mouth daily.   Yes Historical Provider, MD  furosemide (LASIX) 40 MG tablet Take 1 tablet (40 mg total) by mouth daily. 10/23/12  Yes Neema Davina Poke, MD  metoprolol (LOPRESSOR) 100 MG tablet Take 150 mg by mouth daily.   Yes Historical Provider, MD  ramipril (ALTACE) 2.5 MG capsule Take 2.5 mg by mouth every evening.   Yes Historical Provider, MD   Physical Exam: Filed Vitals:   11/28/12 2334  BP: 113/95  Pulse: 93  Temp: 97.5 F (36.4 C)  Resp: 22    General:  NAD, resting  comfortably in bed, fatigued appearing Eyes: PEERLA EOMI ENT: mucous membranes moist Neck: supple w/o JVD Cardiovascular: RRR w/o MRG Respiratory: rhonchi B bases, breathing is clearly worse when patient is lying flat on his back, better when sitting up. Abdomen: soft, nt, nd, bs+ Skin: no rash nor lesion Musculoskeletal: MAE, full ROM all 4 extremities Psychiatric: normal tone and affect Neurologic: AAOx3, grossly non-focal  Labs on Admission:  Basic Metabolic Panel:  Recent Labs Lab 11/28/12 2142  NA 143  K 4.8  CL 106  CO2 25  GLUCOSE 112*  BUN 25*  CREATININE 1.72*  CALCIUM 9.4   Liver Function Tests:  Recent Labs Lab 11/28/12 2142  AST 31  ALT 23  ALKPHOS 100  BILITOT 2.7*  PROT 6.7  ALBUMIN 3.5   No results found for this basename: LIPASE, AMYLASE,  in the last 168 hours No results found for this basename: AMMONIA,  in the last 168 hours CBC:  Recent Labs Lab 11/28/12 2142  WBC 8.2  NEUTROABS 6.4  HGB 14.4  HCT 42.4  MCV 88.3  PLT 172   Cardiac Enzymes: No results found for this basename: CKTOTAL, CKMB, CKMBINDEX, TROPONINI,  in the last 168 hours  BNP (last 3 results)  Recent Labs  10/21/12 1013 11/28/12 2147  PROBNP 7758.0* 33948.0*   CBG: No results found for this basename: GLUCAP,  in the last 168 hours  Radiological Exams on Admission: Dg Chest 2 View  11/28/2012   *RADIOLOGY REPORT*  Clinical Data: Short of breath  CHEST - 2 VIEW  Comparison: 10/21/2012  Findings: Moderate cardiomegaly.  Left subclavian AICD device and leads are stable. Small bilateral pleural effusions.  Mild bibasilar pulmonary opacities compatible with atelectasis verses airspace disease.  No sign of pulmonary edema.  No pneumothorax  IMPRESSION: Cardiomegaly without edema.  Small pleural effusions and minimal bibasilar atelectasis verses airspace disease.   Original Report Authenticated By: Jolaine Click, M.D.    EKG: Independently  reviewed.  Assessment/Plan Principal Problem:   Acute on chronic systolic heart failure Active Problems:   Atrial fibrillation   SLEEP DISORDER   CKD (chronic kidney disease) stage 3, GFR 30-59 ml/min   1. Acute on chronic CHF - very gentle diuresis given his CKD and creatinine of 1.7 today, 40 of lasix given x1 in ED, Feel that a large percentage of this patients problems are secondary to trying to sleep laying completely flat at baseline which likely isnt working well for him given his EF of 15-20% (I would expect orthopnea in such a patient even at baseline without a large amount of fluid  overload).  Discussed this with patient and family.  [facility] to take over care tomorrow, repeat BMP in AM to monitor kidney function.  Have ordered no further diuresis other than to restart his daily PO lasix tomorrow, CXR does not demonstrate a large amount of fluid overload. 2. A.Fib - continue home meds    Code Status: Full Code (must indicate code status--if unknown or must be presumed, indicate so) Family Communication: Spoke with family at bedside (indicate person spoken with, if applicable, with phone number if by telephone) Disposition Plan: Admit to inpatient (indicate anticipated LOS)  Time spent: 70 min  GARDNER, JARED M. [facility] Pager [phone]  If 7PM-7AM, please contact night-coverage [email or web address] Password [facility] 11/29/2012, 12:52 AM

## 2012-11-29 NOTE — Progress Notes (Signed)
Nutrition Brief Note  Patient identified on the Malnutrition Screening Tool (MST) Report. Pt reports recent significant weight loss, however per chart review, weights have been relatively stable. Oral intake is adequate at this time.  Wt Readings from Last 15 Encounters:  11/29/12 173 lb 3.2 oz (78.563 kg)  11/03/12 166 lb (75.297 kg)  10/23/12 165 lb 6.4 oz (75.025 kg)  02/24/12 154 lb (69.854 kg)  02/24/12 154 lb (69.854 kg)  02/05/12 176 lb (79.833 kg)  01/30/12 178 lb (80.74 kg)  09/26/10 184 lb (83.462 kg)  12/20/08 184 lb (83.462 kg)  10/10/08 180 lb (81.647 kg)    Body mass index is 24.17 kg/(m^2). Patient meets criteria for normal weight based on current BMI.   Current diet order is Heart Healthy, patient is consuming approximately 100% of meals at this time. Labs and medications reviewed.   No nutrition interventions warranted at this time. If nutrition issues arise, please consult RD.   Jarold Motto MS, RD, LDN Pager: [phone] After-hours pager: [phone]

## 2012-11-30 DIAGNOSIS — R10819 Abdominal tenderness, unspecified site: Secondary | ICD-10-CM

## 2012-11-30 DIAGNOSIS — I5023 Acute on chronic systolic (congestive) heart failure: Principal | ICD-10-CM

## 2012-11-30 LAB — COMPREHENSIVE METABOLIC PANEL
ALT: 25 U/L (ref 0–53)
AST: 33 U/L (ref 0–37)
CO2: 21 mEq/L (ref 19–32)
Calcium: 9 mg/dL (ref 8.4–10.5)
Chloride: 103 mEq/L (ref 96–112)
Creatinine, Ser: 1.92 mg/dL — ABNORMAL HIGH (ref 0.50–1.35)
GFR calc Af Amer: 38 mL/min — ABNORMAL LOW (ref >90)
GFR calc non Af Amer: 33 mL/min — ABNORMAL LOW (ref >90)
Glucose, Bld: 91 mg/dL (ref 70–99)
Total Bilirubin: 1.8 mg/dL — ABNORMAL HIGH (ref 0.3–1.2)

## 2012-11-30 LAB — PRO B NATRIURETIC PEPTIDE: Pro B Natriuretic peptide (BNP): 29987 pg/mL — ABNORMAL HIGH (ref 0–125)

## 2012-11-30 NOTE — Progress Notes (Signed)
Pt a/o, HOH, pt oob ad lib, pt given IV lasix, no edema noted, pt has not had any c/o SOB or pain, tolerating diet well, will continue to monitor

## 2012-11-30 NOTE — Progress Notes (Signed)
[facility]  Subjective: Endorses subjective improvement in breathing. No further right sided abdominal/flank pain.  Objective: Vital signs in last 24 hours: Temp:  [97 F (36.1 C)-97.7 F (36.5 C)] 97.6 F (36.4 C) (08/12 0454) Pulse Rate:  [69-82] 69 (08/12 1013) Resp:  [18-20] 18 (08/12 0454) BP: (99-110)/(58-77) 110/63 mmHg (08/12 1013) SpO2:  [93 %-96 %] 96 % (08/12 0454) Weight:  [168 lb 6.4 oz (76.386 kg)] 168 lb 6.4 oz (76.386 kg) (08/12 0454) Last BM Date: 11/29/12  Intake/Output from previous day: 08/11 0701 - 08/12 0700 In: 120 [P.O.:120] Out: 970 [Urine:970] Intake/Output this shift:    Medications Current Facility-Administered Medications  Medication Dose Route Frequency Provider Last Rate Last Dose  . 0.9 %  sodium chloride infusion  250 mL Intravenous PRN Hillary Bow, DO      . acetaminophen (TYLENOL) tablet 1,000 mg  1,000 mg Oral Q6H PRN Hillary Bow, DO      . acetaminophen (TYLENOL) tablet 650 mg  650 mg Oral Q4H PRN Hillary Bow, DO      . allopurinol (ZYLOPRIM) tablet 100 mg  100 mg Oral QPM Hillary Bow, DO   100 mg at 11/29/12 1728  . amiodarone (PACERONE) tablet 200 mg  200 mg Oral BID Hillary Bow, DO   200 mg at 11/30/12 1015  . apixaban (ELIQUIS) tablet 5 mg  5 mg Oral BID Hillary Bow, DO   5 mg at 11/30/12 1015  . atorvastatin (LIPITOR) tablet 20 mg  20 mg Oral Daily Hillary Bow, DO   20 mg at 11/30/12 1015  . digoxin (LANOXIN) tablet 0.125 mg  0.125 mg Oral QPM Hillary Bow, DO   0.125 mg at 11/29/12 1728  . donepezil (ARICEPT) tablet 10 mg  10 mg Oral QHS Hillary Bow, DO   10 mg at 11/29/12 2205  . furosemide (LASIX) injection 80 mg  80 mg Intravenous BID Chrystie Nose, MD   80 mg at 11/29/12 1730  . metoprolol (LOPRESSOR) tablet 100 mg  100 mg Oral Daily Leroy Sea, MD   100 mg at 11/30/12 1015  . ondansetron (ZOFRAN) injection 4 mg  4 mg Intravenous Q6H PRN Hillary Bow, DO      . ramipril (ALTACE) capsule 2.5 mg  2.5 mg Oral QPM Hillary Bow, DO   2.5 mg at 11/29/12 1728  . sodium chloride 0.9 % injection 3 mL  3 mL Intravenous Q12H Hillary Bow, DO   3 mL at 11/30/12 1016  . sodium chloride 0.9 % injection 3 mL  3 mL Intravenous PRN Hillary Bow, DO        PE: General appearance: alert, cooperative and mild distress Neck: mild JVD Lungs: faint bibasilar crakles Heart: regular rate and rhythm, S1, S2 normal, no murmur, click, rub or gallop Extremities: no LEE Pulses: 2+ and symmetric Skin: warm and dry Neurologic: Grossly normal  Lab Results:   Recent Labs  11/28/12 2142  WBC 8.2  HGB 14.4  HCT 42.4  PLT 172   BMET  Recent Labs  11/28/12 2142 11/29/12 1024 11/30/12 0455  NA 143 140 140  K 4.8 4.1 3.8  CL 106 105 103  CO2 25 23 21   GLUCOSE 112* 114* 91  BUN 25* 30* 37*  CREATININE 1.72* 1.84* 1.92*  CALCIUM 9.4 9.2 9.0   BNP (last 3 results)  Recent Labs  10/21/12 1013 11/28/12 2147  11/30/12 0455  PROBNP 7758.0* 33948.0* 29987.0*    Assessment/Plan  Principal Problem:   Acute on chronic systolic heart failure Active Problems:   Atrial fibrillation   SLEEP DISORDER   CKD (chronic kidney disease) stage 3, GFR 30-59 ml/min  Plan: Very little urine output despite high dose diuretic. ? If strict I/Os have been recorded. Scr increased to 1.92. He endorses significant improvement in breathing compared to yesterday. He has faint bibasilar crackles and JVD. BNP improved from 33K to 29K Continue with IV diuretic. Continue to follow BNP. Will need to watch renal function closely. MD to follow.     LOS: 2 days    Brittainy M. Sharol Harness, PA-C 11/30/2012 11:46 AM   I have seen and examined the patient along with Brittainy M. Sharol Harness, PA-C.  I have reviewed the chart, notes and new data.  I agree with PA's note.  Key new complaints: substantially improved UO and reduced dyspnea after increased diuretic dose;  no longer has flank pain, but has low abdominal discomfort.   Key examination changes: crackles almost resolved Key new findings / data: improved BNP, still very high; Abd US shows renal cysts and old RUQ cyst (peritoneal cyst? Described in 2004). Incidental gallstones (no signs cholecystitis). No hydronephrosis or visible kidney stone.  PLAN: Not ready for DC. Reevaluate for transition to PO diuretics in AM  Thurmon Fair, MD, [facility] [phone] 11/30/2012, 4:47 PM

## 2012-11-30 NOTE — Care Management Note (Unsigned)
    Page 1 of 2   11/30/2012     11:32:52 AM   CARE MANAGEMENT NOTE 11/30/2012  Patient:  Nathan Mckinney, Nathan Mckinney   Account Number:  [id number]  Date Initiated:  11/30/2012  Documentation initiated by:  [facility]  Subjective/Objective Assessment:   72 y.o. male who presents with h/o R flank pain for 1 week, worse with position changes, no pain at this time however. Patient also reports SOB for approximately 2 weeks, he was recently hospitalized with CHF. //hm with spouse     Action/Plan:   diurese/ home with Home Health   Anticipated DC Date:  11/30/2012   Anticipated DC Plan:  HOME W HOME HEALTH SERVICES      DC Planning Services  CM consult      [facility] Choice  HOME HEALTH   Choice offered to / List presented to:  C-1 Patient        HH arranged  HH-1 RN  HH-10 DISEASE MANAGEMENT      HH agency  [facility]   Status of service:  Completed, signed off Medicare Important Message given?   (If response is "NO", the following Medicare IM given date fields will be blank) Date Medicare IM given:   Date Additional Medicare IM given:    Discharge Disposition:    Per UR Regulation:  Reviewed for med. necessity/level of care/duration of stay  If discussed at Long Length of Stay Meetings, dates discussed:    Comments:  11/30/12 1100 Oletta Cohn, RN, BSN, [employer] [phone] Spoke with pt at bedside regarding discharge planning for Home Health services.  Offered pt list of HH agencies.  Pt chose [facility] to render services.  Kizzie Furnish of [facility] notified.  No DME needs identifed at this time.

## 2012-11-30 NOTE — Progress Notes (Signed)
[facility] Progress Note  11/30/2012  Subjective: Pt is reporting today that he is feeling a little better.  No further abdominal pain.  No chest pain.  No SOB.   Objective:  Vital signs in last 24 hours: Filed Vitals:   11/29/12 0937 11/29/12 1417 11/29/12 2136 11/30/12 0454  BP: 105/75 102/68 99/58 107/77  Pulse: 74 76 78 82  Temp:  97 F (36.1 C) 97.7 F (36.5 C) 97.6 F (36.4 C)  TempSrc:  Oral Oral Oral  Resp:  20 18 18   Height:      Weight:    76.386 kg (168 lb 6.4 oz)  SpO2:  96% 93% 96%   Weight change: -2.177 kg (-4 lb 12.8 oz)  Intake/Output Summary (Last 24 hours) at 11/30/12 1013 Last data filed at 11/30/12 0455  Gross per 24 hour  Intake    120 ml  Output    820 ml  Net   -700 ml   Lab Results  Component Value Date   HGBA1C  Value: 6.1 (NOTE)   The ADA recommends the following therapeutic goal for glycemic   control related to Hgb A1C measurement:   Goal of Therapy:   < 7.0% Hgb A1C   Reference: American Diabetes Association: Clinical Practice   Recommendations 2008, Diabetes Care,  2008, 31:(Suppl 1). 06/06/2008   Lab Results  Component Value Date   LDLCALC 105* 01/30/2012   CREATININE 1.92* 11/30/2012    Review of Systems As above, otherwise all reviewed and reported negative  Physical Exam Awake Alert, Oriented X 3,  Normal affect  [place].AT,PERRL  Supple Neck,No JVD, No cervical lymphadenopathy appriciated.  Symmetrical Chest wall movement, Good air movement bilaterally, few rales  RRR,No Gallops,Rubs or new Murmurs  BS present, Abd Soft, Non tender, No organomegaly appreciated, No rebound - guarding or rigidity.  No Cyanosis, Clubbing or edema, No new Rash or bruise  Lab Results: Results for orders placed during the hospital encounter of 11/28/12 (from the past 24 hour(s))  BASIC METABOLIC PANEL     Status: Abnormal   Collection Time    11/29/12 10:24 AM      Result Value Range   Sodium 140  135 - 145 mEq/L   Potassium 4.1  3.5 - 5.1  mEq/L   Chloride 105  96 - 112 mEq/L   CO2 23  19 - 32 mEq/L   Glucose, Bld 114 (*) 70 - 99 mg/dL   BUN 30 (*) 6 - 23 mg/dL   Creatinine, Ser 5.78 (*) 0.50 - 1.35 mg/dL   Calcium 9.2  8.4 - 46.9 mg/dL   GFR calc non Af Amer 35 (*) >90 mL/min   GFR calc Af Amer 40 (*) >90 mL/min  MAGNESIUM     Status: None   Collection Time    11/29/12 10:24 AM      Result Value Range   Magnesium 2.0  1.5 - 2.5 mg/dL  TROPONIN I     Status: None   Collection Time    11/29/12  1:17 PM      Result Value Range   Troponin I <0.30  <0.30 ng/mL  COMPREHENSIVE METABOLIC PANEL     Status: Abnormal   Collection Time    11/30/12  4:55 AM      Result Value Range   Sodium 140  135 - 145 mEq/L   Potassium 3.8  3.5 - 5.1 mEq/L   Chloride 103  96 - 112 mEq/L   CO2 21  19 - 32 mEq/L   Glucose, Bld 91  70 - 99 mg/dL   BUN 37 (*) 6 - 23 mg/dL   Creatinine, Ser 1.61 (*) 0.50 - 1.35 mg/dL   Calcium 9.0  8.4 - 09.6 mg/dL   Total Protein 5.7 (*) 6.0 - 8.3 g/dL   Albumin 3.0 (*) 3.5 - 5.2 g/dL   AST 33  0 - 37 U/L   ALT 25  0 - 53 U/L   Alkaline Phosphatase 82  39 - 117 U/L   Total Bilirubin 1.8 (*) 0.3 - 1.2 mg/dL   GFR calc non Af Amer 33 (*) >90 mL/min   GFR calc Af Amer 38 (*) >90 mL/min  MAGNESIUM     Status: None   Collection Time    11/30/12  4:55 AM      Result Value Range   Magnesium 1.9  1.5 - 2.5 mg/dL  PRO B NATRIURETIC PEPTIDE     Status: Abnormal   Collection Time    11/30/12  4:55 AM      Result Value Range   Pro B Natriuretic peptide (BNP) 29987.0 (*) 0 - 125 pg/mL    Micro Results: No results found for this or any previous visit (from the past 240 hour(s)).  Medications:  Scheduled Meds: . allopurinol  100 mg Oral QPM  . amiodarone  200 mg Oral BID  . apixaban  5 mg Oral BID  . atorvastatin  20 mg Oral Daily  . digoxin  0.125 mg Oral QPM  . donepezil  10 mg Oral QHS  . furosemide  80 mg Intravenous BID  . metoprolol  100 mg Oral Daily  . ramipril  2.5 mg Oral QPM  . sodium  chloride  3 mL Intravenous Q12H   Continuous Infusions:  PRN Meds:.sodium chloride, acetaminophen, acetaminophen, ondansetron (ZOFRAN) IV, sodium chloride  Assessment/Plan: 1. Acute on chronic systolic heart failure - latest EF 15 to 20 % on recent echo gram, patient has AICD, cardiology is primary now and they're following the patient, defer management of this problem to cardiology.   2. History of atrial fibrillation. Goal will be rate control he is on beta blocker, digoxin along with amiodarone and Eliquis, continue to monitor, nonsustained V. tach on it 03/10/2013, again continue on beta blocker and amiodarone, has AICD, adult the primary team following, will defer cardiology issues to the primary team.   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 Baseline creatinine around 1.7, for now close to baseline we'll continue to monitor. Can be gently diuresed with close renal function monitoring.   4. Recent right-sided flank pain. Question etiology, says he has had renal stones in the past, will check abdominal ultrasound and monitor.   5. Mildly elevated total bilirubin. Abdominal ultrasound reveals gallstones and stable liver cyst. Repeat CMP in the morning.    LOS: 2 days   Deren Degrazia 11/30/2012, 10:13 AM   Rodney Langton, MD, CDE, FAAFP [facility] [place], [place]  Digital Pager [phone]

## 2012-12-01 DIAGNOSIS — I428 Other cardiomyopathies: Secondary | ICD-10-CM

## 2012-12-01 LAB — COMPREHENSIVE METABOLIC PANEL
CO2: 25 mEq/L (ref 19–32)
Calcium: 9.2 mg/dL (ref 8.4–10.5)
Creatinine, Ser: 1.86 mg/dL — ABNORMAL HIGH (ref 0.50–1.35)
GFR calc Af Amer: 40 mL/min — ABNORMAL LOW (ref >90)
GFR calc non Af Amer: 34 mL/min — ABNORMAL LOW (ref >90)
Glucose, Bld: 94 mg/dL (ref 70–99)
Total Protein: 6 g/dL (ref 6.0–8.3)

## 2012-12-01 MED ORDER — FUROSEMIDE 40 MG PO TABS
40.0000 mg | ORAL_TABLET | Freq: Two times a day (BID) | ORAL | Status: DC
  Administered 2012-12-01: 40 mg via ORAL
  Filled 2012-12-01 (×4): qty 1

## 2012-12-01 NOTE — Progress Notes (Signed)
Subjective: Breathing better.  RUQ pain essentially resolved.  Objective: Vital signs in last 24 hours: Temp:  [97.5 F (36.4 C)-98 F (36.7 C)] 97.5 F (36.4 C) (08/13 0615) Pulse Rate:  [65-77] 74 (08/13 0945) Resp:  [18-20] 18 (08/13 0945) BP: (99-113)/(58-82) 99/67 mmHg (08/13 0945) SpO2:  [92 %-95 %] 94 % (08/13 0945) Weight:  [162 lb 7.7 oz (73.7 kg)] 162 lb 7.7 oz (73.7 kg) (08/13 0615) Last BM Date: 11/30/12  Intake/Output from previous day: 08/12 0701 - 08/13 0700 In: 840 [P.O.:840] Out: 2600 [Urine:2600] Intake/Output this shift: Total I/O In: 360 [P.O.:360] Out: -   Medications Current Facility-Administered Medications  Medication Dose Route Frequency Provider Last Rate Last Dose  . 0.9 %  sodium chloride infusion  250 mL Intravenous PRN Hillary Bow, DO      . acetaminophen (TYLENOL) tablet 1,000 mg  1,000 mg Oral Q6H PRN Hillary Bow, DO      . acetaminophen (TYLENOL) tablet 650 mg  650 mg Oral Q4H PRN Hillary Bow, DO      . allopurinol (ZYLOPRIM) tablet 100 mg  100 mg Oral QPM Hillary Bow, DO   100 mg at 11/30/12 1704  . amiodarone (PACERONE) tablet 200 mg  200 mg Oral BID Hillary Bow, DO   200 mg at 12/01/12 0949  . apixaban (ELIQUIS) tablet 5 mg  5 mg Oral BID Hillary Bow, DO   5 mg at 12/01/12 7829  . atorvastatin (LIPITOR) tablet 20 mg  20 mg Oral Daily Hillary Bow, DO   20 mg at 12/01/12 0948  . digoxin (LANOXIN) tablet 0.125 mg  0.125 mg Oral QPM Hillary Bow, DO   0.125 mg at 11/30/12 1704  . donepezil (ARICEPT) tablet 10 mg  10 mg Oral QHS Hillary Bow, DO   10 mg at 11/30/12 2246  . furosemide (LASIX) tablet 40 mg  40 mg Oral BID Wilburt Finlay, PA-C      . metoprolol (LOPRESSOR) tablet 100 mg  100 mg Oral Daily Leroy Sea, MD   100 mg at 11/30/12 1015  . ondansetron (ZOFRAN) injection 4 mg  4 mg Intravenous Q6H PRN Hillary Bow, DO      . ramipril (ALTACE) capsule 2.5 mg  2.5 mg Oral QPM Hillary Bow, DO   2.5 mg at 11/30/12 1704  . sodium chloride 0.9 % injection 3 mL  3 mL Intravenous Q12H Hillary Bow, DO   3 mL at 12/01/12 0950  . sodium chloride 0.9 % injection 3 mL  3 mL Intravenous PRN Hillary Bow, DO        PE: General appearance: alert, cooperative and no distress Neck: no JVD Lungs: clear to auscultation bilaterally Heart: regular rate and rhythm and 26 sys mm at the apex. Abdomen: BS, nontender,  no distension. Extremities: No LEE Pulses: 2+ and symmetric Neurologic: Grossly normal  Lab Results:   Recent Labs  11/28/12 2142  WBC 8.2  HGB 14.4  HCT 42.4  PLT 172   BMET  Recent Labs  11/29/12 1024 11/30/12 0455 12/01/12 0540  NA 140 140 141  K 4.1 3.8 3.6  CL 105 103 103  CO2 23 21 25   GLUCOSE 114* 91 94  BUN 30* 37* 37*  CREATININE 1.84* 1.92* 1.86*  CALCIUM 9.2 9.0 9.2    Assessment/Plan  Principal Problem:   Acute on chronic systolic heart failure Active Problems:   Nonischemic  cardiomyopathy,  EF 35-40% by echo 07/2012.     Atrial fibrillation   SLEEP DISORDER   CKD (chronic kidney disease) stage 3, GFR 30-59 ml/min  Plan:  Net fluids:  -1.7L/-2.5L.  SCr. Stable.  milldly hypotensive.  Lasix at 80mg  BID IV.  Changing to 40mg  PO BID.  Home dose 40 daily.  He and his wife report eating out a lot, which likely means he is getting too much sodium.  Likely DC home tomorrow.  TCM 7 follow up.  He gets his primary care at the [place].  I asked him to get up and walk in the hall today.   LOS: 3 days    Ersilia Brawley 12/01/2012 12:04 PM

## 2012-12-01 NOTE — Progress Notes (Signed)
Pt lyin gin bed O4x., no complaints of pain or sob. BB held d/t SBP <110 (99). Will continue to monitor

## 2012-12-01 NOTE — Plan of Care (Signed)
Problem: Phase I Progression Outcomes Goal: EF % per last Echo/documented,Core Reminder form on chart 35-40%

## 2012-12-01 NOTE — Progress Notes (Signed)
[facility] Progress Note  12/01/2012  Subjective: Pt without complaints today. No abdominal pain, No CP, no SOB  Objective:  Vital signs in last 24 hours: Filed Vitals:   11/30/12 2245 12/01/12 0411 12/01/12 0615 12/01/12 0945  BP:  113/77 113/72 99/67  Pulse:  77 69 74  Temp: 97.7 F (36.5 C) 97.6 F (36.4 C) 97.5 F (36.4 C)   TempSrc: Oral Oral Oral   Resp:  18 19 18   Height:      Weight:   73.7 kg (162 lb 7.7 oz)   SpO2:  94% 92% 94%   Weight change: -2.686 kg (-5 lb 14.7 oz)  Intake/Output Summary (Last 24 hours) at 12/01/12 1247 Last data filed at 12/01/12 0900  Gross per 24 hour  Intake   1080 ml  Output   2600 ml  Net  -1520 ml   Lab Results  Component Value Date   HGBA1C  Value: 6.1 (NOTE)   The ADA recommends the following therapeutic goal for glycemic   control related to Hgb A1C measurement:   Goal of Therapy:   < 7.0% Hgb A1C   Reference: American Diabetes Association: Clinical Practice   Recommendations 2008, Diabetes Care,  2008, 31:(Suppl 1). 06/06/2008   Lab Results  Component Value Date   LDLCALC 105* 01/30/2012   CREATININE 1.86* 12/01/2012    Review of Systems As above, otherwise all reviewed and reported negative  Physical Exam Awake Alert, Oriented X 3, Normal affect  [place].AT,PERRL  Supple Neck,No JVD, No cervical lymphadenopathy appriciated.  Symmetrical Chest wall movement, Good air movement bilaterally, few rales  Card: RRR,No Gallops,Rubs or new Murmurs  BS present, Abd Soft, Non tender, No organomegaly appreciated, No rebound - guarding or rigidity.  No Cyanosis, Clubbing   Lab Results: Results for orders placed during the hospital encounter of 11/28/12 (from the past 24 hour(s))  COMPREHENSIVE METABOLIC PANEL     Status: Abnormal   Collection Time    12/01/12  5:40 AM      Result Value Range   Sodium 141  135 - 145 mEq/L   Potassium 3.6  3.5 - 5.1 mEq/L   Chloride 103  96 - 112 mEq/L   CO2 25  19 - 32 mEq/L   Glucose, Bld  94  70 - 99 mg/dL   BUN 37 (*) 6 - 23 mg/dL   Creatinine, Ser 4.54 (*) 0.50 - 1.35 mg/dL   Calcium 9.2  8.4 - 09.8 mg/dL   Total Protein 6.0  6.0 - 8.3 g/dL   Albumin 3.1 (*) 3.5 - 5.2 g/dL   AST 32  0 - 37 U/L   ALT 30  0 - 53 U/L   Alkaline Phosphatase 87  39 - 117 U/L   Total Bilirubin 1.7 (*) 0.3 - 1.2 mg/dL   GFR calc non Af Amer 34 (*) >90 mL/min   GFR calc Af Amer 40 (*) >90 mL/min    Micro Results: No results found for this or any previous visit (from the past 240 hour(s)).  Medications:  Scheduled Meds: . allopurinol  100 mg Oral QPM  . amiodarone  200 mg Oral BID  . apixaban  5 mg Oral BID  . atorvastatin  20 mg Oral Daily  . digoxin  0.125 mg Oral QPM  . donepezil  10 mg Oral QHS  . furosemide  40 mg Oral BID  . metoprolol  100 mg Oral Daily  . ramipril  2.5 mg Oral QPM  .  sodium chloride  3 mL Intravenous Q12H   Continuous Infusions:  PRN Meds:.sodium chloride, acetaminophen, acetaminophen, ondansetron (ZOFRAN) IV, sodium chloride  Assessment/Plan: 1. Acute on chronic systolic heart failure - latest EF 15 to 20 % on recent echo gram, patient has AICD, cardiology is primary now and they're following the patient, defer management of this problem to cardiology.  2. History of atrial fibrillation. Goal will be rate control he is on beta blocker, digoxin along with amiodarone and Eliquis, continue to monitor, nonsustained V. tach on it 03/10/2013, again continue on beta blocker and amiodarone, has AICD, adult the primary team following, will defer cardiology issues to the primary team.   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 Baseline creatinine around 1.7, for now close to baseline, continue to monitor. Can be gently diuresed with close renal function monitoring.   4. Recent right-sided flank pain. Question etiology, says he has had renal stones in the past, will check abdominal ultrasound and monitor.   5. Mildly elevated total bilirubin. Abdominal ultrasound reveals gallstones and  stable liver cyst.   LOS: 3 days   Nathan Mckinney 12/01/2012, 12:47 PM   Rodney Langton, MD, CDE, FAAFP [facility] [place], [place]  Digital Pager [phone]

## 2012-12-01 NOTE — Progress Notes (Signed)
Pt. Seen and examined. Agree with the NP/PA-C note as written.  He is up and walking around. Markedly less short of breath. We are making progress with urine output. Switched to po lasix today. Creatinine is stable. BNP remains high - it is coming down, but he does have CKD - hard to know the baseline. I agree that dietary indiscretion is playing a big role in his heart failure and I spoke with him and his wife about what diet is approved and salt avoidance. We'll see how he looks tomorrow, but anticipate possible discharge with early follow-up.  Chrystie Nose, MD, [facility] Attending Cardiologist [facility]

## 2012-12-02 DIAGNOSIS — F0280 Dementia in other diseases classified elsewhere without behavioral disturbance: Secondary | ICD-10-CM

## 2012-12-02 LAB — COMPREHENSIVE METABOLIC PANEL
ALT: 26 U/L (ref 0–53)
AST: 22 U/L (ref 0–37)
Albumin: 3.1 g/dL — ABNORMAL LOW (ref 3.5–5.2)
Alkaline Phosphatase: 84 U/L (ref 39–117)
CO2: 27 mEq/L (ref 19–32)
Chloride: 100 mEq/L (ref 96–112)
Potassium: 3.6 mEq/L (ref 3.5–5.1)
Total Bilirubin: 1.5 mg/dL — ABNORMAL HIGH (ref 0.3–1.2)

## 2012-12-02 MED ORDER — FUROSEMIDE 40 MG PO TABS: 40.0000 mg | ORAL_TABLET | Freq: Two times a day (BID) | ORAL | Status: DC

## 2012-12-02 MED ORDER — METOPROLOL TARTRATE 100 MG PO TABS: 50.0000 mg | ORAL_TABLET | Freq: Two times a day (BID) | ORAL | Status: DC

## 2012-12-02 NOTE — Progress Notes (Signed)
Pharmacist Heart Failure Core Measure Documentation  Assessment: Nathan Mckinney has an EF documented as < 40% in chart.  Rationale: Heart failure patients with left ventricular systolic dysfunction (LVSD) and an EF < 40% should be prescribed an angiotensin converting enzyme inhibitor (ACEI) or angiotensin receptor blocker (ARB) at discharge unless a contraindication is documented in the medical record.  This patient is not currently on an ACEI or ARB for HF.  This note is being placed in the record in order to provide documentation that a contraindication to the use of these agents is present for this encounter.  ACE Inhibitor or Angiotensin Receptor Blocker is contraindicated (specify all that apply)  []   ACEI allergy AND ARB allergy []   Angioedema []   Moderate or severe aortic stenosis []   Hyperkalemia []   Hypotension []   Renal artery stenosis [x]   Worsening renal function, preexisting renal disease or dysfunction   Elwin Sleight 12/02/2012 9:17 AM

## 2012-12-02 NOTE — Progress Notes (Signed)
Pt. Seen and examined. Agree with the NP/PA-C note as written.  He fells much better. Was net negative 300 cc yesterday.  I would continue lasix 40 mg po BID. Creatinine is hovering around 1.8-1.9 which I think is a new baseline. Weight today is down to 160 lbs. This is significantly below his office weight in October 2013 (176 lbs). I think we should consider this his new dry weight.  He can be discharged home today. Follow-up in the office in 5-7 days (TCM 7) - complicated heart failure with low output, high risk for re-admission due to questionable compliance. Would divide lopressor into 50 mg po BID (he is on metoprolol tartrate, short acting) on discharge, he has had low normal BP's.  Chrystie Nose, MD, [facility] Attending Cardiologist [facility]

## 2012-12-02 NOTE — Progress Notes (Signed)
The patient is A & O x 4 this morning and did not have any complaints of pain or SOB overnight.

## 2012-12-02 NOTE — Progress Notes (Signed)
Subjective:  No SOB  Objective:  Vital Signs in the last 24 hours: Temp:  [97.5 F (36.4 C)-98.2 F (36.8 C)] 98 F (36.7 C) (08/14 0543) Pulse Rate:  [71-86] 76 (08/14 0933) Resp:  [18-20] 18 (08/14 0933) BP: (100-120)/(55-76) 100/55 mmHg (08/14 0933) SpO2:  [96 %-97 %] 96 % (08/14 0933) Weight:  [160 lb 3.2 oz (72.666 kg)] 160 lb 3.2 oz (72.666 kg) (08/14 0543)  Intake/Output from previous day:  Intake/Output Summary (Last 24 hours) at 12/02/12 1025 Last data filed at 12/02/12 0821  Gross per 24 hour  Intake   1203 ml  Output   1650 ml  Net   -447 ml    Physical Exam: General appearance: alert, cooperative and no distress Lungs: clear to auscultation bilaterally Heart: regular rate and rhythm   Rate: 76  Rhythm: atrial fibrillation  Lab Results: No results found for this basename: WBC, HGB, PLT,  in the last 72 hours  Recent Labs  12/01/12 0540 12/02/12 0518  NA 141 141  K 3.6 3.6  CL 103 100  CO2 25 27  GLUCOSE 94 95  BUN 37* 33*  CREATININE 1.86* 1.97*    Recent Labs  11/29/12 1317  TROPONINI <0.30   Hepatic Function Panel  Recent Labs  12/02/12 0518  PROT 6.3  ALBUMIN 3.1*  AST 22  ALT 26  ALKPHOS 84  BILITOT 1.5*   No results found for this basename: CHOL,  in the last 72 hours No results found for this basename: INR,  in the last 72 hours  Imaging: Imaging results have been reviewed  Cardiac Studies:  Assessment/Plan:   Principal Problem:   Acute on chronic systolic heart failure Active Problems:   Nonischemic cardiomyopathy,  EF 35-40% by echo 07/2012.     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 His Altace has been stopped, will  discuss home Lasix dose with MD- he had been on Lasix 40 mg daily.  Corine Shelter PA-C Beeper [phone] 12/02/2012, 10:25 AM

## 2012-12-02 NOTE — Evaluation (Signed)
Physical Therapy One Time Evaluation Patient Details Name: Nathan Mckinney MRN: [id number] DOB: [birth year] Today's Date: 12/02/2012 Time: 2956-2130 PT Time Calculation (min): 10 min  PT Assessment / Plan / Recommendation History of Present Illness  72 y.o. male who presents with h/o R flank pain for 1 week, worse with position changes, no pain at this time however. Patient also reports SOB for approximately 2 weeks, he was recently hospitalized with CHF.  Pt admitted with acute on chronic systolic heart failure.  Clinical Impression  Patient evaluated by Physical Therapy with no further acute PT needs identified. All education has been completed and the patient has no further questions.  Pt with mild unsteady gait initially however improved with distance. Pt reports occasional issues with balance however declines any PT upon d/c at this time.  Pt reports spouse available to assist and in good health.  PT is signing off. Thank you for this referral.     PT Assessment  Patent does not need any further PT services    Follow Up Recommendations  Home health PT (however pt declines at this time)    Does the patient have the potential to tolerate intense rehabilitation      Barriers to Discharge        Equipment Recommendations  None recommended by PT (pt declines DME)    Recommendations for Other Services     Frequency      Precautions / Restrictions Precautions Precautions: Fall   Pertinent Vitals/Pain n/a      Mobility  Bed Mobility Bed Mobility: Supine to Sit Supine to Sit: 7: Independent Transfers Transfers: Sit to Stand;Stand to Sit Sit to Stand: 6: Modified independent (Device/Increase time) Stand to Sit: 6: Modified independent (Device/Increase time) Ambulation/Gait Ambulation/Gait Assistance: 5: Supervision;4: Min guard Ambulation Distance (Feet): 250 Feet Assistive device: None Ambulation/Gait Assistance Details: pt a little unsteady initially however improved  with distance, L LE appeared to have increased knee flexion and valgus with gait however no deficits observed with AROM once sitting in recliner Gait Pattern: Step-through pattern Stairs: Yes Stairs Assistance: 5: Supervision Stairs Assistance Details (indicate cue type and reason): pt ascended forwards and descended backwards Stair Management Technique: One rail Left;Forwards;Backwards Number of Stairs: 3    Exercises     PT Diagnosis:    PT Problem List:   PT Treatment Interventions:       PT Goals(Current goals can be found in the care plan section) Acute Rehab PT Goals PT Goal Formulation: No goals set, d/c therapy  Visit Information  Last PT Received On: 12/02/12 Assistance Needed: +1 History of Present Illness: 72 y.o. male who presents with h/o R flank pain for 1 week, worse with position changes, no pain at this time however. Patient also reports SOB for approximately 2 weeks, he was recently hospitalized with CHF.  Pt admitted with acute on chronic systolic heart failure.       Prior Functioning  Home Living Family/patient expects to be discharged to:: Private residence Living Arrangements: Spouse/significant other Type of Home: House Home Access: Stairs to enter Secretary/administrator of Steps: 3 Entrance Stairs-Rails: Left Home Layout: One level Home Equipment: None Prior Function Level of Independence: Independent Communication Communication: No difficulties    Cognition  Cognition Arousal/Alertness: Awake/alert Behavior During Therapy: WFL for tasks assessed/performed Overall Cognitive Status: Within Functional Limits for tasks assessed    Extremity/Trunk Assessment Lower Extremity Assessment Lower Extremity Assessment: Overall WFL for tasks assessed   Balance    End  of Session PT - End of Session Activity Tolerance: Patient tolerated treatment well Patient left: in chair;with call bell/phone within reach  GP     [facility] E 12/02/2012, 11:40  AM Zenovia Jarred, PT, DPT 12/02/2012 Pager: [phone]

## 2012-12-02 NOTE — Progress Notes (Signed)
Pt d/c to home with wife. D/c instructions and medications reviewed with Pt. Pt states understanding. Reinforced the need to limit Na intake and to be careful when eating out. All pt questions answered

## 2012-12-02 NOTE — Discharge Summary (Signed)
Patient ID: Nathan Mckinney,  MRN: [id number], DOB/AGE: 1940/12/25 72 y.o.  Admit date: 11/28/2012 Discharge date: 12/02/2012  Primary Care Provider: Dr Susann Givens  Primary Cardiologist: Dr Alanda Amass  Discharge Diagnoses Principal Problem:   Acute on chronic systolic heart failure Active Problems:   Nonischemic cardiomyopathy,  EF 35-40% by echo 07/2012.     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 He had moderate CAD at cath in 2004 and had a negative Myoview in 2010. He has an ICD that was upgraded to a MDT BiV ICD in Nov 2013. He has recently had AF. Dr Ladona Ridgel did not think he was a candidate for RFA. He is on chronic anticoagulation and Amiodarone. He was admitted 11/28/12 with nausea and was found to have an elevated BNP. He was felt to be volume overloaded. He diuresed from 173-160 lbs. We stopped his Altace secondary to a bump in his SCr. We did increase his daily Lasix to 40 mg BID. He will be a TCM follow up next week.  Discharge Vitals:  Blood pressure 100/55, pulse 76, temperature 98 F (36.7 C), temperature source Oral, resp. rate 18, height 5\' 11"  (1.803 m), weight 160 lb 3.2 oz (72.666 kg), SpO2 96.00%.    Labs: Results for orders placed during the hospital encounter of 11/28/12 (from the past 48 hour(s))  COMPREHENSIVE METABOLIC PANEL     Status: Abnormal   Collection Time    12/01/12  5:40 AM      Result Value Range   Sodium 141  135 - 145 mEq/L   Potassium 3.6  3.5 - 5.1 mEq/L   Chloride 103  96 - 112 mEq/L   CO2 25  19 - 32 mEq/L   Glucose, Bld 94  70 - 99 mg/dL   BUN 37 (*) 6 - 23 mg/dL   Creatinine, Ser 5.28 (*) 0.50 - 1.35 mg/dL   Calcium 9.2  8.4 - 41.3 mg/dL   Total Protein 6.0  6.0 - 8.3 g/dL   Albumin 3.1 (*) 3.5 - 5.2 g/dL   AST 32  0 - 37 U/L   ALT 30  0 - 53 U/L   Alkaline Phosphatase 87  39 - 117 U/L   Total Bilirubin 1.7 (*) 0.3 - 1.2 mg/dL   GFR calc non Af Amer 34 (*) >90 mL/min   GFR calc Af Amer 40 (*) >90 mL/min   Comment:            The eGFR has been calculated     using the CKD EPI equation.     This calculation has not been     validated in all clinical     situations.     eGFR's persistently     <90 mL/min signify     possible Chronic Kidney Disease.  COMPREHENSIVE METABOLIC PANEL     Status: Abnormal   Collection Time    12/02/12  5:18 AM      Result Value Range   Sodium 141  135 - 145 mEq/L   Potassium 3.6  3.5 - 5.1 mEq/L   Chloride 100  96 -  112 mEq/L   CO2 27  19 - 32 mEq/L   Glucose, Bld 95  70 - 99 mg/dL   BUN 33 (*) 6 - 23 mg/dL   Creatinine, Ser 1.61 (*) 0.50 - 1.35 mg/dL   Calcium 8.9  8.4 - 09.6 mg/dL   Total Protein 6.3  6.0 - 8.3 g/dL   Albumin 3.1 (*) 3.5 - 5.2 g/dL   AST 22  0 - 37 U/L   ALT 26  0 - 53 U/L   Alkaline Phosphatase 84  39 - 117 U/L   Total Bilirubin 1.5 (*) 0.3 - 1.2 mg/dL   GFR calc non Af Amer 32 (*) >90 mL/min   GFR calc Af Amer 37 (*) >90 mL/min   Comment: (NOTE)     The eGFR has been calculated using the CKD EPI equation.     This calculation has not been validated in all clinical situations.     eGFR's persistently <90 mL/min signify possible Chronic Kidney     Disease.    Disposition:  Follow-up Information   Follow up with HAGER, BRYAN, PA-C On 12/09/2012. (10:00)    Specialty:  Physician Assistant   Contact information:   [address] [place] [address] [phone]       Discharge Medications:    Medication List    STOP taking these medications       ramipril 2.5 MG capsule  Commonly known as:  ALTACE      TAKE these medications       acetaminophen 500 MG tablet  Commonly known as:  TYLENOL  Take 1,000 mg by mouth every 6 (six)  hours as needed for pain.     allopurinol 100 MG tablet  Commonly known as:  ZYLOPRIM  Take 1 tablet by mouth every evening.     amiodarone 200 MG tablet  Commonly known as:  PACERONE  Take 200 mg by mouth 2 (two) times daily.     atorvastatin 20 MG tablet  Commonly known as:  LIPITOR  Take 20 mg by mouth daily.     Cholecalciferol 10000 UNITS Caps  Take 1 capsule by mouth daily.     digoxin 0.125 MG tablet  Commonly known as:  LANOXIN  Take 0.125 mg by mouth every evening.     donepezil 10 MG tablet  Commonly known as:  ARICEPT  Take 10 mg by mouth daily.     ELIQUIS 5 MG Tabs tablet  Generic drug:  apixaban  Take 5 mg by mouth 2 (two) times daily.     furosemide 40 MG tablet  Commonly known as:  LASIX  Take 1 tablet (40 mg total) by mouth 2 (two) times daily.     metoprolol 100 MG tablet  Commonly known as:  LOPRESSOR  Take 0.5 tablets (50 mg total) by mouth 2 (two) times daily.         Duration of Discharge Encounter: Greater than 30 minutes including physician time.  Jolene Provost PA-C 12/02/2012 11:50 AM

## 2012-12-02 NOTE — Progress Notes (Signed)
[facility] consult note                                                                                Patient Demographics  Nathan Mckinney, is a 72 y.o. male, DOB - September 28, 1940, ZOX:[id number], WJX:[id number]  Admit date - 11/28/2012  Admitting Physician Hillary Bow, DO  Outpatient Primary MD for the patient is Pcp Not In System  LOS - 4   Chief Complaint  Patient presents with  . Shortness of Breath        Assessment & Plan    1. Acute on chronic systolic heart failure - latest EF 15 to 20 % on recent echo gram, patient has AICD, cardiology is primary now and they're following the patient, defer management of this problem to cardiology.    2. History of atrial fibrillation. Goal will be rate control he is on beta blocker, digoxin along with amiodarone and Eliquis, continue to monitor, nonsustained V. tach on it 03/10/2013, again continue on beta blocker and amiodarone, has AICD, adult the primary team following, will defer cardiology issues to the primary team.     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 Baseline creatinine around 1.7, creatinine is mildly elevated than baseline, consider holding ACE inhibitor and Lasix for couple of days close monitoring of BMP post discharge.    4. Recent right-sided flank pain. Now resolved, Question etiology, no Uretric calculi or hydronephrosis    5. Mildly elevated total bilirubin. Abdominal ultrasound reveals gallstones and stable liver cyst. recommend one time GI followup post discharge.       DVT Prophylaxis Apixaban  Lab Results  Component Value Date   PLT 172 11/28/2012    Medications  Scheduled Meds: . allopurinol  100 mg Oral QPM  . amiodarone  200 mg Oral BID  . apixaban  5 mg Oral BID  . atorvastatin  20 mg Oral Daily  . digoxin  0.125 mg Oral QPM  . donepezil  10 mg Oral QHS  . [START ON 12/03/2012] furosemide  40 mg Oral BID  . metoprolol  100 mg Oral Daily  . sodium chloride  3 mL Intravenous Q12H   Continuous  Infusions:  PRN Meds:.sodium chloride, acetaminophen, acetaminophen, ondansetron (ZOFRAN) IV, sodium chloride  Antibiotics    Anti-infectives   None       Time Spent in minutes   35   Susa Raring K M.D on 12/02/2012 at 11:28 AM  Between 7am to 7pm - Pager - [phone]  After 7pm go to [email or web address] - password [email or web address]  And look for the night coverage person covering for me after hours  [facility] Office  [phone]    Subjective:   Nathan Mckinney today has, No headache, No chest pain, No abdominal pain - No Nausea, No new weakness tingling or numbness, No Cough - SOB.   Objective:   Filed Vitals:   12/01/12 1344 12/01/12 2036 12/02/12 0543 12/02/12 0933  BP: 120/58 110/66 101/76 100/55  Pulse: 71 75 86 76  Temp: 97.5 F (36.4 C) 98.2 F (36.8 C) 98 F (36.7 C)   TempSrc: Oral Oral Oral   Resp: 20 18 18 18   Height:  Weight:   72.666 kg (160 lb 3.2 oz)   SpO2: 96% 97% 97% 96%    Wt Readings from Last 3 Encounters:  12/02/12 72.666 kg (160 lb 3.2 oz)  11/03/12 75.297 kg (166 lb)  10/23/12 75.025 kg (165 lb 6.4 oz)     Intake/Output Summary (Last 24 hours) at 12/02/12 1128 Last data filed at 12/02/12 4098  Gross per 24 hour  Intake   1203 ml  Output   1650 ml  Net   -447 ml    Exam Awake Alert, Oriented X 3, No new F.N deficits, Normal affect [place].AT,PERRAL Supple Neck,No JVD, No cervical lymphadenopathy appriciated.  Symmetrical Chest wall movement, Good air movement bilaterally, CTAB RRR,No Gallops,Rubs or new Murmurs, No Parasternal Heave +ve B.Sounds, Abd Soft, Non tender, No organomegaly appriciated, No rebound - guarding or rigidity. No Cyanosis, Clubbing or edema, No new Rash or bruise   Data Review   Micro Results No results found for this or any previous visit (from the past 240 hour(s)).  Radiology Reports Dg Chest 2 View  11/28/2012   *RADIOLOGY REPORT*  Clinical Data: Short of breath  CHEST - 2 VIEW  Comparison:  10/21/2012  Findings: Moderate cardiomegaly.  Left subclavian AICD device and leads are stable. Small bilateral pleural effusions.  Mild bibasilar pulmonary opacities compatible with atelectasis verses airspace disease.  No sign of pulmonary edema.  No pneumothorax  IMPRESSION: Cardiomegaly without edema.  Small pleural effusions and minimal bibasilar atelectasis verses airspace disease.   Original Report Authenticated By: Jolaine Click, M.D.   US Abdomen Complete  11/29/2012   *RADIOLOGY REPORT*  Clinical Data:  Acute renal failure.  Elevated bilirubin and hypertension.  COMPLETE ABDOMINAL ULTRASOUND  Comparison:  Chest CT 01/03/2010.  Abdominal ultrasound 11/28/2009.  Findings:  Gallbladder: Gallstones and sludge are noted.  There is focal thickening of the gallbladder wall to 9 mm.  Sonographic Murphy's sign is absent.  Common bile duct:   Normal in caliber without filling defects.  Liver:  Echogenicity is within normal limits.  No focal hepatic abnormalities are identified.  IVC:  Visualized portions appear unremarkable.  Pancreas:  Visualized portions appear unremarkable.  Spleen:  Visualized portions appear unremarkable.  Right Kidney:  There is cortical thinning with increased echogenicity.  No hydronephrosis is present.  There is a small cyst measuring 1.6 cm maximally.  Renal length 10.3 cm.  Left Kidney: There is cortical thinning with increased echogenicity.  Several simple cysts are noted, measuring up to 2.8 cm in diameter.  There is no hydronephrosis.  Renal length 10.5 cm.  Abdominal aorta:  Visualized portions appear unremarkable.  There are bilateral pleural effusions.  A cystic structure is noted wrapping around the anterolateral aspect of the liver, measuring approximately 11.6 x 5.2 x 11.6 cm.  This was partially imaged on prior chest CT and appears grossly unchanged.  This was reported on a CT done 02/04/2003 (no longer available for comparison) and was queried to reflect a peritoneal cyst.   IMPRESSION:  1.  Gallstones and sludge with focal thickening of the gallbladder wall.  There is no sonographic Murphy's sign to suggest acute cholecystitis, although chronic inflammation cannot be excluded. 2.  No biliary dilatation. 3.  Stable cyst lateral to the liver, reported on prior studies dating back to 2004 and likely an incidental peritoneal cyst. 4.  Bilateral renal cortical thinning consistent with medical renal disease.  No hydronephrosis.   Original Report Authenticated By: Carey Bullocks, M.D.    CBC  Recent Labs Lab 11/28/12 2142  WBC 8.2  HGB 14.4  HCT 42.4  PLT 172  MCV 88.3  MCH 30.0  MCHC 34.0  RDW 15.6*  LYMPHSABS 0.9  MONOABS 0.7  EOSABS 0.2  BASOSABS 0.0    Chemistries   Recent Labs Lab 11/28/12 2142 11/29/12 1024 11/30/12 0455 12/01/12 0540 12/02/12 0518  NA 143 140 140 141 141  K 4.8 4.1 3.8 3.6 3.6  CL 106 105 103 103 100  CO2 25 23 21 25 27   GLUCOSE 112* 114* 91 94 95  BUN 25* 30* 37* 37* 33*  CREATININE 1.72* 1.84* 1.92* 1.86* 1.97*  CALCIUM 9.4 9.2 9.0 9.2 8.9  MG  --  2.0 1.9  --   --   AST 31  --  33 32 22  ALT 23  --  25 30 26   ALKPHOS 100  --  82 87 84  BILITOT 2.7*  --  1.8* 1.7* 1.5*   ------------------------------------------------------------------------------------------------------------------ estimated creatinine clearance is 34.3 ml/min (by C-G formula based on Cr of 1.97). ------------------------------------------------------------------------------------------------------------------ No results found for this basename: HGBA1C,  in the last 72 hours ------------------------------------------------------------------------------------------------------------------ No results found for this basename: CHOL, HDL, LDLCALC, TRIG, CHOLHDL, LDLDIRECT,  in the last 72 hours ------------------------------------------------------------------------------------------------------------------ No results found for this basename: TSH,  T4TOTAL, FREET3, T3FREE, THYROIDAB,  in the last 72 hours ------------------------------------------------------------------------------------------------------------------ No results found for this basename: VITAMINB12, FOLATE, FERRITIN, TIBC, IRON, RETICCTPCT,  in the last 72 hours  Coagulation profile No results found for this basename: INR, PROTIME,  in the last 168 hours  No results found for this basename: DDIMER,  in the last 72 hours  Cardiac Enzymes  Recent Labs Lab 11/29/12 0100 11/29/12 0855 11/29/12 1317  TROPONINI <0.30 <0.30 <0.30   ------------------------------------------------------------------------------------------------------------------ No components found with this basename: POCBNP,

## 2012-12-09 ENCOUNTER — Ambulatory Visit (INDEPENDENT_AMBULATORY_CARE_PROVIDER_SITE_OTHER): Payer: Medicare Other | Admitting: Physician Assistant

## 2012-12-09 ENCOUNTER — Encounter: Payer: Self-pay | Admitting: Physician Assistant

## 2012-12-09 ENCOUNTER — Telehealth: Payer: Self-pay | Admitting: Family Medicine

## 2012-12-09 VITALS — BP 90/70 | HR 80 | Ht 71.0 in | Wt 161.8 lb

## 2012-12-09 DIAGNOSIS — I2510 Atherosclerotic heart disease of native coronary artery without angina pectoris: Secondary | ICD-10-CM

## 2012-12-09 DIAGNOSIS — I5023 Acute on chronic systolic (congestive) heart failure: Secondary | ICD-10-CM

## 2012-12-09 NOTE — Patient Instructions (Addendum)
Decrease Lasix to 40 mg once daily. Continue to monitor weight every morning. If you notice a 2 pound gain in 24 hours or 5 pound gain in 7 days restart 40 mg of Lasix twice daily for 3 days and make sure he weight starts to decrease. It is not decreasing, call our office for an appointment.  Continue to monitor blood pressure once daily and alternate the time of day.  Followup with Dr. Alanda Amass in one month

## 2012-12-09 NOTE — Assessment & Plan Note (Signed)
Patient appears to be doing well other than some fatigue. This may be a result of his hypotension, decreased cardiac output and mild aortic insufficiency.   He is clearly not volume overloaded or in any acute heart failure. His weight today is practically identical to his discharge weight and may actually be less given he is wearing all his clothes today.  He is hypotensive with a blood pressure of 90/70.  I cut back his Lasix to once daily. I discussed,  and wrote down, instructions on weight management and when to take additional Lasix.  His son was present and heard the instructions.    He will follow up with Dr. Alanda Amass in one month or sooner if needed.

## 2012-12-09 NOTE — Telephone Encounter (Signed)
Left message for pt to call needs an appt for hospital follow up

## 2012-12-09 NOTE — Progress Notes (Signed)
Date:  12/09/2012   ID:  Nathan Mckinney, DOB 26-Sep-1940, MRN [id number]  PCP:  Pcp Not In System  Primary Cardiologist:  Dr. Alanda Amass    History of Present Illness: Nathan Mckinney is a 72 y.o. male followed by Dr Alanda Amass with a history of NICM. He was accompanied by his son today. He had moderate CAD at cath in 2004 and had a negative Myoview in 2010. He has an ICD that was upgraded to a MDT BiV ICD in Nov 2013. He has recently had AF. Dr Ladona Ridgel did not think he was a candidate for RFA. He is on chronic anticoagulation and Amiodarone.  His last 2-D echocardiogram was 09/07/2012 and was completed at [facility] in [place].  It showed ejection fraction of 15-20%.  There was moderate mitral regurgitation, no significant aortic regurgitation or stenosis, trivial tricuspid regurgitation, left atrium is mildly enlarged. Left ventricle was severely dilated. Severe global hypokinesis. Normal right atrial size. Normal right ventricular size.  His previous echocardiogram was April 2014 at that time his ejection fraction was 35-40%.   He was admitted 11/28/12 to [facility] with nausea and was found to have an elevated BNP. He was felt to be volume overloaded. He diuresed from 173-160 lbs. We stopped his Altace secondary to a bump in his SCr. We did increase his daily Lasix to 40 mg BID.  Patient presents today for followup to his hospitalization. He reports having decreased energy but is otherwise doing well. He's been monitoring his weight a daily basis and has been stable.  His discharge weight was 160 pounds here in the office he is 161.  The patient currently denies nausea, vomiting, fever, chest pain, shortness of breath, orthopnea, dizziness, PND, cough, congestion, abdominal pain, hematochezia, melena, lower extremity edema.    Wt Readings from Last 3 Encounters:  12/09/12 161 lb 12.8 oz (73.392 kg)  12/02/12 160 lb 3.2 oz (72.666 kg)    11/03/12 166 lb (75.297 kg)     Past Medical History  Diagnosis Date  . Hypertension   . Chronic tension headaches   . Tinea cruris   . Tinea unguium   . Hernia, hiatal   . CVA (cerebral infarction)   . Systolic heart failure     EF 40-98% in 08/2012 (Echo at [facility] [place])  . Bradycardia   . Ventricular tachycardia   . Syncope   . CAD (coronary artery disease)     h/o MI in 2001, admitted for possible STEMI at [facility] in 08/2012 - Cardiac cath at [facility] in 08/2012: Nonobstructive coronary artery disease  . Sleep disorder   . Atrial fibrillation     On Eliquis, new onset in 08/2012 - successful cardioversion to sinus rhythm  . Automatic implantable cardioverter-defibrillator in situ   . Pacemaker     ICD/PACEMAKER   . Arthritis     HANDS  . CHF (congestive heart failure)   . Myocardial infarction   . Anginal pain   . Depression   . Shortness of breath   . Pneumonia     Current Outpatient Prescriptions  Medication Sig Dispense Refill  . acetaminophen (TYLENOL) 500 MG tablet Take 1,000 mg by mouth every 6 (six) hours as needed for pain.      Marland Kitchen allopurinol (ZYLOPRIM) 100 MG tablet Take 1 tablet by mouth every evening.       Marland Kitchen amiodarone (PACERONE) 200 MG tablet Take 200 mg by  mouth 2 (two) times daily.       Marland Kitchen apixaban (ELIQUIS) 5 MG TABS tablet Take 5 mg by mouth 2 (two) times daily.      Marland Kitchen atorvastatin (LIPITOR) 20 MG tablet Take 20 mg by mouth daily.      . Cholecalciferol 10000 UNITS CAPS Take 1 capsule by mouth daily.      . digoxin (LANOXIN) 0.125 MG tablet Take 0.125 mg by mouth every evening.      . donepezil (ARICEPT) 10 MG tablet Take 10 mg by mouth daily.      . furosemide (LASIX) 40 MG tablet Take 1 tablet (40 mg total) by mouth 2 (two) times daily.  60 tablet  11  . metoprolol (LOPRESSOR) 100 MG tablet Take 0.5 tablets (50 mg total) by mouth 2 (two) times daily.       No current facility-administered medications for this visit.     Allergies:    Allergies  Allergen Reactions  . Penicillins     REACTION: Rash    Social History:  The patient  reports that he has been passively smoking.  He has never used smokeless tobacco. He reports that he does not drink alcohol or use illicit drugs.   Family history:   Family History  Problem Relation Age of Onset  . Cancer Mother     unclear what type  . Cancer Father     stomach    ROS:  Please see the history of present illness.  All other systems reviewed and negative.   PHYSICAL EXAM: VS:  BP 90/70  Pulse 80  Ht 5\' 11"  (1.803 m)  Wt 161 lb 12.8 oz (73.392 kg)  BMI 22.58 kg/m2 Well nourished, well developed, in no acute distress HEENT: Pupils are equal round react to light accommodation extraocular movements are intact. Scabbed over lesion the left side of his nose which the patient continues to pick at. Neck: no JVDNo cervical lymphadenopathy. Cardiac: Regular rate and rhythm with 2/6 systolic murmur loudest in the apex and one out of 6 diastolic murmur Lungs:  clear to auscultation bilaterally, no wheezing, rhonchi or rales Abd: soft, nontender, positive bowel sounds all quadrants, no hepatosplenomegaly Ext: no lower extremity edema.  2+ radial and 1+ dorsalis pedis pulses. Skin: warm and dry Neuro:  Grossly normal  EKG:   Ventricular paced rhythm at 80 beats per minute  ASSESSMENT AND PLAN:  Problem List Items Addressed This Visit   Acute on chronic systolic heart failure     Patient appears to be doing well other than some fatigue. This may be a result of his hypotension, decreased cardiac output and mild aortic insufficiency.   He is clearly not volume overloaded or in any acute heart failure. His weight today is practically identical to his discharge weight and may actually be less given he is wearing all his clothes today.  He is hypotensive with a blood pressure of 90/70.  I cut back his Lasix to once daily. I discussed,  and wrote down, instructions  on weight management and when to take additional Lasix.  His son was present and heard the instructions.    He will follow up with Dr. Alanda Amass in one month or sooner if needed.     Other Visit Diagnoses   CAD (coronary artery disease)    -  Primary    Relevant Orders       EKG 12-Lead

## 2012-12-14 ENCOUNTER — Telehealth: Payer: Self-pay | Admitting: Cardiovascular Disease

## 2012-12-14 NOTE — Telephone Encounter (Signed)
Message forwarded to Medical Records to process. 

## 2012-12-14 NOTE — Telephone Encounter (Signed)
Pt have just enrolled in a CHF program-She needs his most recent ejection fraction please.

## 2012-12-17 ENCOUNTER — Ambulatory Visit: Payer: Medicare Other | Admitting: Internal Medicine

## 2012-12-22 ENCOUNTER — Telehealth: Payer: Self-pay | Admitting: Cardiovascular Disease

## 2012-12-22 NOTE — Telephone Encounter (Signed)
Returned call and spoke w/ April.  Stated she needs Dx code for BNP on 2.3.14.  Reviewed paper chart and informed requisition states code is 786.05.  April found hard copy req in file and stated code wasn't transferred when it was entered.

## 2012-12-22 NOTE — Telephone Encounter (Signed)
Need diagnosis code for BNP- order-was on 05-24-12.

## 2012-12-31 ENCOUNTER — Telehealth: Payer: Self-pay | Admitting: Cardiovascular Disease

## 2012-12-31 NOTE — Telephone Encounter (Signed)
Need to know the directions for his Pradaxa.

## 2012-12-31 NOTE — Telephone Encounter (Signed)
Returned call and spoke w/ Boneta Lucks.  Informed PA already completed and approved as she began to ask for info for PA.  Informed returning call asking for directions.  Boneta Lucks took directions: 150 mg BID #60 if local or #90 if mail order.  Informed RN is NOT authorizing med to be mailed to pt.  Verbalized understanding.  Call to pt and left message to call [phone] when message received.

## 2013-01-10 ENCOUNTER — Encounter (HOSPITAL_COMMUNITY): Payer: Self-pay | Admitting: *Deleted

## 2013-01-10 ENCOUNTER — Inpatient Hospital Stay (HOSPITAL_COMMUNITY)
Admission: EM | Admit: 2013-01-10 | Discharge: 2013-01-21 | DRG: 291 | Disposition: A | Discharge status: Home Health | Payer: Medicare Other | Attending: Cardiology | Admitting: Cardiology

## 2013-01-10 ENCOUNTER — Emergency Department (HOSPITAL_COMMUNITY): Payer: Medicare Other

## 2013-01-10 ENCOUNTER — Telehealth: Payer: Self-pay | Admitting: Cardiology

## 2013-01-10 DIAGNOSIS — K644 Residual hemorrhoidal skin tags: Secondary | ICD-10-CM | POA: Diagnosis present

## 2013-01-10 DIAGNOSIS — Z9852 Vasectomy status: Secondary | ICD-10-CM

## 2013-01-10 DIAGNOSIS — K8020 Calculus of gallbladder without cholecystitis without obstruction: Secondary | ICD-10-CM | POA: Diagnosis present

## 2013-01-10 DIAGNOSIS — I2510 Atherosclerotic heart disease of native coronary artery without angina pectoris: Secondary | ICD-10-CM

## 2013-01-10 DIAGNOSIS — R079 Chest pain, unspecified: Secondary | ICD-10-CM

## 2013-01-10 DIAGNOSIS — Z7901 Long term (current) use of anticoagulants: Secondary | ICD-10-CM

## 2013-01-10 DIAGNOSIS — Z1211 Encounter for screening for malignant neoplasm of colon: Secondary | ICD-10-CM

## 2013-01-10 DIAGNOSIS — I079 Rheumatic tricuspid valve disease, unspecified: Secondary | ICD-10-CM | POA: Diagnosis present

## 2013-01-10 DIAGNOSIS — I472 Ventricular tachycardia, unspecified: Secondary | ICD-10-CM

## 2013-01-10 DIAGNOSIS — Z66 Do not resuscitate: Secondary | ICD-10-CM | POA: Diagnosis present

## 2013-01-10 DIAGNOSIS — D126 Benign neoplasm of colon, unspecified: Secondary | ICD-10-CM

## 2013-01-10 DIAGNOSIS — R319 Hematuria, unspecified: Secondary | ICD-10-CM | POA: Diagnosis not present

## 2013-01-10 DIAGNOSIS — I4729 Other ventricular tachycardia: Secondary | ICD-10-CM | POA: Diagnosis present

## 2013-01-10 DIAGNOSIS — R0989 Other specified symptoms and signs involving the circulatory and respiratory systems: Secondary | ICD-10-CM

## 2013-01-10 DIAGNOSIS — R0609 Other forms of dyspnea: Secondary | ICD-10-CM

## 2013-01-10 DIAGNOSIS — I059 Rheumatic mitral valve disease, unspecified: Secondary | ICD-10-CM | POA: Diagnosis present

## 2013-01-10 DIAGNOSIS — R531 Weakness: Secondary | ICD-10-CM

## 2013-01-10 DIAGNOSIS — R5383 Other fatigue: Secondary | ICD-10-CM

## 2013-01-10 DIAGNOSIS — I509 Heart failure, unspecified: Secondary | ICD-10-CM

## 2013-01-10 DIAGNOSIS — K5730 Diverticulosis of large intestine without perforation or abscess without bleeding: Secondary | ICD-10-CM | POA: Diagnosis present

## 2013-01-10 DIAGNOSIS — IMO0002 Reserved for concepts with insufficient information to code with codable children: Secondary | ICD-10-CM

## 2013-01-10 DIAGNOSIS — N179 Acute kidney failure, unspecified: Secondary | ICD-10-CM | POA: Diagnosis present

## 2013-01-10 DIAGNOSIS — T82110A Breakdown (mechanical) of cardiac electrode, initial encounter: Secondary | ICD-10-CM

## 2013-01-10 DIAGNOSIS — I340 Nonrheumatic mitral (valve) insufficiency: Secondary | ICD-10-CM | POA: Diagnosis present

## 2013-01-10 DIAGNOSIS — I428 Other cardiomyopathies: Secondary | ICD-10-CM

## 2013-01-10 DIAGNOSIS — R5381 Other malaise: Secondary | ICD-10-CM

## 2013-01-10 DIAGNOSIS — I6350 Cerebral infarction due to unspecified occlusion or stenosis of unspecified cerebral artery: Secondary | ICD-10-CM | POA: Diagnosis present

## 2013-01-10 DIAGNOSIS — T82190A Other mechanical complication of cardiac electrode, initial encounter: Secondary | ICD-10-CM | POA: Diagnosis present

## 2013-01-10 DIAGNOSIS — I5042 Chronic combined systolic (congestive) and diastolic (congestive) heart failure: Secondary | ICD-10-CM

## 2013-01-10 DIAGNOSIS — Z95810 Presence of automatic (implantable) cardiac defibrillator: Secondary | ICD-10-CM

## 2013-01-10 DIAGNOSIS — G479 Sleep disorder, unspecified: Secondary | ICD-10-CM | POA: Diagnosis present

## 2013-01-10 DIAGNOSIS — I5023 Acute on chronic systolic (congestive) heart failure: Secondary | ICD-10-CM

## 2013-01-10 DIAGNOSIS — G44229 Chronic tension-type headache, not intractable: Secondary | ICD-10-CM | POA: Diagnosis present

## 2013-01-10 DIAGNOSIS — N183 Chronic kidney disease, stage 3 unspecified: Secondary | ICD-10-CM

## 2013-01-10 DIAGNOSIS — Z880 Allergy status to penicillin: Secondary | ICD-10-CM

## 2013-01-10 DIAGNOSIS — E43 Unspecified severe protein-calorie malnutrition: Secondary | ICD-10-CM | POA: Diagnosis present

## 2013-01-10 DIAGNOSIS — I252 Old myocardial infarction: Secondary | ICD-10-CM

## 2013-01-10 DIAGNOSIS — I129 Hypertensive chronic kidney disease with stage 1 through stage 4 chronic kidney disease, or unspecified chronic kidney disease: Secondary | ICD-10-CM | POA: Diagnosis present

## 2013-01-10 DIAGNOSIS — I4819 Other persistent atrial fibrillation: Secondary | ICD-10-CM

## 2013-01-10 DIAGNOSIS — Z515 Encounter for palliative care: Secondary | ICD-10-CM

## 2013-01-10 DIAGNOSIS — Z79899 Other long term (current) drug therapy: Secondary | ICD-10-CM

## 2013-01-10 DIAGNOSIS — I071 Rheumatic tricuspid insufficiency: Secondary | ICD-10-CM | POA: Diagnosis present

## 2013-01-10 DIAGNOSIS — I4891 Unspecified atrial fibrillation: Secondary | ICD-10-CM

## 2013-01-10 DIAGNOSIS — Z8673 Personal history of transient ischemic attack (TIA), and cerebral infarction without residual deficits: Secondary | ICD-10-CM

## 2013-01-10 DIAGNOSIS — I4892 Unspecified atrial flutter: Secondary | ICD-10-CM | POA: Diagnosis present

## 2013-01-10 DIAGNOSIS — I5043 Acute on chronic combined systolic (congestive) and diastolic (congestive) heart failure: Principal | ICD-10-CM | POA: Diagnosis present

## 2013-01-10 DIAGNOSIS — N184 Chronic kidney disease, stage 4 (severe): Secondary | ICD-10-CM | POA: Diagnosis present

## 2013-01-10 DIAGNOSIS — R570 Cardiogenic shock: Secondary | ICD-10-CM

## 2013-01-10 DIAGNOSIS — F0280 Dementia in other diseases classified elsewhere without behavioral disturbance: Secondary | ICD-10-CM

## 2013-01-10 HISTORY — DX: Cardiac murmur, unspecified: R01.1

## 2013-01-10 HISTORY — DX: Other persistent atrial fibrillation: I48.19

## 2013-01-10 HISTORY — DX: Acute on chronic systolic (congestive) heart failure: I50.23

## 2013-01-10 HISTORY — DX: Chronic combined systolic (congestive) and diastolic (congestive) heart failure: I50.42

## 2013-01-10 HISTORY — DX: Dementia in other diseases classified elsewhere without behavioral disturbance: F02.80

## 2013-01-10 HISTORY — DX: Other cardiomyopathies: I42.8

## 2013-01-10 HISTORY — DX: Rheumatic tricuspid insufficiency: I07.1

## 2013-01-10 HISTORY — DX: Chronic kidney disease, stage 3 unspecified: N18.30

## 2013-01-10 HISTORY — DX: Chronic kidney disease, stage 3 (moderate): N18.3

## 2013-01-10 HISTORY — DX: Nonrheumatic mitral (valve) insufficiency: I34.0

## 2013-01-10 HISTORY — DX: Long term (current) use of anticoagulants: Z79.01

## 2013-01-10 LAB — CBC
MCH: 30 pg (ref 26.0–34.0)
MCHC: 34 g/dL (ref 30.0–36.0)
MCV: 88.3 fL (ref 78.0–100.0)
Platelets: 209 10*3/uL (ref 150–400)
RDW: 16.4 % — ABNORMAL HIGH (ref 11.5–15.5)

## 2013-01-10 LAB — PRO B NATRIURETIC PEPTIDE: Pro B Natriuretic peptide (BNP): 27226 pg/mL — ABNORMAL HIGH (ref 0–125)

## 2013-01-10 LAB — BASIC METABOLIC PANEL
CO2: 26 mEq/L (ref 19–32)
Calcium: 9 mg/dL (ref 8.4–10.5)
Creatinine, Ser: 1.93 mg/dL — ABNORMAL HIGH (ref 0.50–1.35)
GFR calc Af Amer: 38 mL/min — ABNORMAL LOW (ref >90)
GFR calc non Af Amer: 33 mL/min — ABNORMAL LOW (ref >90)

## 2013-01-10 NOTE — ED Notes (Signed)
Pt in c/o shortness of breath since last night, pt with history of CHF and states that he normally has problems with feeling short of breath but states this has been different in the last few days, states the last two nights when he lays down he feels like he can't catch his breath, also admits to intermittent chest pain, denies chest pain at this time, pt states he has gained 6 lbs in the last week, has been compliant on his medications, pt speaking in full sentences, no distress noted

## 2013-01-11 ENCOUNTER — Telehealth: Payer: Self-pay | Admitting: Cardiology

## 2013-01-11 ENCOUNTER — Encounter (HOSPITAL_COMMUNITY): Payer: Self-pay | Admitting: Neurology

## 2013-01-11 DIAGNOSIS — I428 Other cardiomyopathies: Secondary | ICD-10-CM

## 2013-01-11 DIAGNOSIS — I059 Rheumatic mitral valve disease, unspecified: Secondary | ICD-10-CM

## 2013-01-11 DIAGNOSIS — Z95810 Presence of automatic (implantable) cardiac defibrillator: Secondary | ICD-10-CM

## 2013-01-11 DIAGNOSIS — I4891 Unspecified atrial fibrillation: Secondary | ICD-10-CM

## 2013-01-11 LAB — COMPREHENSIVE METABOLIC PANEL
ALT: 25 U/L (ref 0–53)
AST: 27 U/L (ref 0–37)
Albumin: 3.6 g/dL (ref 3.5–5.2)
Alkaline Phosphatase: 85 U/L (ref 39–117)
Chloride: 109 mEq/L (ref 96–112)
Creatinine, Ser: 1.85 mg/dL — ABNORMAL HIGH (ref 0.50–1.35)
GFR calc non Af Amer: 34 mL/min — ABNORMAL LOW (ref >90)
Potassium: 4.5 mEq/L (ref 3.5–5.1)
Sodium: 144 mEq/L (ref 135–145)
Total Bilirubin: 1.7 mg/dL — ABNORMAL HIGH (ref 0.3–1.2)
Total Protein: 6.3 g/dL (ref 6.0–8.3)

## 2013-01-11 LAB — CBC WITH DIFFERENTIAL/PLATELET
Basophils Absolute: 0 10*3/uL (ref 0.0–0.1)
Basophils Relative: 1 % (ref 0–1)
Hemoglobin: 14.4 g/dL (ref 13.0–17.0)
Lymphs Abs: 1.7 10*3/uL (ref 0.7–4.0)
MCHC: 34 g/dL (ref 30.0–36.0)
MCV: 88.3 fL (ref 78.0–100.0)
Neutro Abs: 4.7 10*3/uL (ref 1.7–7.7)
Neutrophils Relative %: 60 % (ref 43–77)
Platelets: 191 10*3/uL (ref 150–400)
RDW: 16.3 % — ABNORMAL HIGH (ref 11.5–15.5)

## 2013-01-11 LAB — D-DIMER, QUANTITATIVE: D-Dimer, Quant: 1.71 ug/mL-FEU — ABNORMAL HIGH (ref 0.00–0.48)

## 2013-01-11 LAB — TROPONIN I
Troponin I: <0.3 ng/mL (ref <0.30)
Troponin I: <0.3 ng/mL (ref <0.30)
Troponin I: <0.3 ng/mL (ref <0.30)

## 2013-01-11 MED ORDER — DONEPEZIL HCL 10 MG PO TABS
10.0000 mg | ORAL_TABLET | Freq: Every day | ORAL | Status: DC
  Administered 2013-01-11 – 2013-01-21 (×11): 10 mg via ORAL
  Filled 2013-01-11 (×11): qty 1

## 2013-01-11 MED ORDER — ALLOPURINOL 100 MG PO TABS
100.0000 mg | ORAL_TABLET | Freq: Every evening | ORAL | Status: DC
  Administered 2013-01-11 – 2013-01-20 (×9): 100 mg via ORAL
  Filled 2013-01-11 (×11): qty 1

## 2013-01-11 MED ORDER — APIXABAN 5 MG PO TABS: 5.0000 mg | ORAL_TABLET | Freq: Two times a day (BID) | ORAL | Status: DC

## 2013-01-11 MED ORDER — APIXABAN 5 MG PO TABS
5.0000 mg | ORAL_TABLET | Freq: Two times a day (BID) | ORAL | Status: DC
  Administered 2013-01-11 – 2013-01-14 (×7): 5 mg via ORAL
  Filled 2013-01-11 (×10): qty 1

## 2013-01-11 MED ORDER — FUROSEMIDE 10 MG/ML IJ SOLN
20.0000 mg | Freq: Once | INTRAMUSCULAR | Status: AC
  Administered 2013-01-11: 20 mg via INTRAVENOUS

## 2013-01-11 MED ORDER — AMIODARONE HCL 200 MG PO TABS
200.0000 mg | ORAL_TABLET | Freq: Two times a day (BID) | ORAL | Status: DC
  Administered 2013-01-11 – 2013-01-20 (×20): 200 mg via ORAL
  Filled 2013-01-11 (×23): qty 1

## 2013-01-11 MED ORDER — ACETAMINOPHEN 325 MG PO TABS
650.0000 mg | ORAL_TABLET | ORAL | Status: DC | PRN
  Administered 2013-01-14 – 2013-01-19 (×4): 650 mg via ORAL
  Filled 2013-01-11 (×4): qty 2

## 2013-01-11 MED ORDER — LISINOPRIL 2.5 MG PO TABS
2.5000 mg | ORAL_TABLET | Freq: Every day | ORAL | Status: DC
  Administered 2013-01-11 – 2013-01-14 (×4): 2.5 mg via ORAL
  Filled 2013-01-11 (×4): qty 1

## 2013-01-11 MED ORDER — FUROSEMIDE 10 MG/ML IJ SOLN
INTRAMUSCULAR | Status: AC
  Filled 2013-01-11: qty 4

## 2013-01-11 MED ORDER — METOPROLOL TARTRATE 50 MG PO TABS
50.0000 mg | ORAL_TABLET | Freq: Two times a day (BID) | ORAL | Status: DC
  Administered 2013-01-11 – 2013-01-14 (×7): 50 mg via ORAL
  Filled 2013-01-11 (×8): qty 1

## 2013-01-11 MED ORDER — DIGOXIN 125 MCG PO TABS
0.1250 mg | ORAL_TABLET | Freq: Every evening | ORAL | Status: DC
  Administered 2013-01-11 – 2013-01-15 (×5): 0.125 mg via ORAL
  Filled 2013-01-11 (×6): qty 1

## 2013-01-11 MED ORDER — ONDANSETRON HCL 4 MG/2ML IJ SOLN
4.0000 mg | Freq: Four times a day (QID) | INTRAMUSCULAR | Status: DC | PRN
  Administered 2013-01-12: 4 mg via INTRAVENOUS
  Filled 2013-01-11: qty 2

## 2013-01-11 MED ORDER — ATORVASTATIN CALCIUM 20 MG PO TABS
20.0000 mg | ORAL_TABLET | Freq: Every day | ORAL | Status: DC
  Administered 2013-01-11 – 2013-01-21 (×11): 20 mg via ORAL
  Filled 2013-01-11 (×11): qty 1

## 2013-01-11 MED ORDER — INFLUENZA VAC SPLIT QUAD 0.5 ML IM SUSP
0.5000 mL | INTRAMUSCULAR | Status: AC
  Administered 2013-01-12: 0.5 mL via INTRAMUSCULAR
  Filled 2013-01-11: qty 0.5

## 2013-01-11 MED ORDER — FUROSEMIDE 10 MG/ML IJ SOLN
20.0000 mg | Freq: Once | INTRAMUSCULAR | Status: AC
  Administered 2013-01-11: 20 mg via INTRAVENOUS
  Filled 2013-01-11: qty 2

## 2013-01-11 NOTE — Progress Notes (Signed)
Device interrogation shows AF with RVR and only 67% BiV pacing efficiency. Device is programmed VVIR, although the most recent device check I can find documents programming DDDR (July 16). At that time AF burden was only 1-2%.  He is on amiodarone 400 mg daily.  Optivol rising since September 1, crossed "threshold" on September 13.  No VT episodes.  Note chronic low LV lead impedance (programmed LV ring to RV coil), otw normal function.  Will ask for EP consultation. Might consider increase in amiodarone dose or evaluate for AV node ablation.

## 2013-01-11 NOTE — Consult Note (Signed)
Reason for Consult: A/C CHF Referring Physician: TRH   HPI: Nathan Mckinney is a 72 y.o. male followed by Dr Alanda Amass with a history of NICM. He had moderate CAD at cath in 2004 and had a negative Myoview in 2010. He has an ICD that was upgraded to a MDT BiV ICD in Nov 2013. He has recently had AF. Dr Ladona Ridgel did not think he was a candidate for RFA. He is on chronic anticoagulation and Amiodarone. His last 2-D echocardiogram was 09/07/2012 and was completed at [facility] in [place]. It showed ejection fraction of 15-20%. There was moderate mitral regurgitation, no significant aortic regurgitation or stenosis, trivial tricuspid regurgitation, left atrium is mildly enlarged. Left ventricle was severely dilated. Severe global hypokinesis. Normal right atrial size. Normal right ventricular size. His previous echocardiogram was April 2014 at that time his ejection fraction was 35-40%. He recently was hospitalized a little over a month ago. He was admitted 11/28/12 to [facility] with nausea and was found to have an elevated BNP. He was felt to be volume overloaded. He diuresed from 173-160 lbs. We stopped his Altace secondary to a bump in his SCr. We did increase his daily Lasix to 40 mg BID.  He presented back to [facility] last night with a complaint of increasing SOB at rest. It has been going on for nearly 7 days now and had worsened. He notes associated PND and orthopnea. He denies CP and LEE. He reports compliance with his medications but notes that he did eat a can of soup several days ago.    Past Medical History  Diagnosis Date  . Hypertension   . Chronic tension headaches   . Tinea cruris   . Tinea unguium   . Hernia, hiatal   . CVA (cerebral infarction)   . Systolic heart failure     EF 95-62% in 08/2012 (Echo at [facility], [place])  . Bradycardia   . Ventricular tachycardia   . Syncope   . CAD (coronary artery disease)     h/o MI in 2001,  admitted for possible STEMI at [facility] in 08/2012 - Cardiac cath at [facility] in 08/2012: Nonobstructive coronary artery disease  . Sleep disorder   . Atrial fibrillation     On Eliquis, new onset in 08/2012 - successful cardioversion to sinus rhythm  . Automatic implantable cardioverter-defibrillator in situ   . Pacemaker     ICD/PACEMAKER   . Arthritis     HANDS  . CHF (congestive heart failure)   . Myocardial infarction   . Anginal pain   . Depression   . Shortness of breath   . Pneumonia   . Heart murmur   . Stroke     Past Surgical History  Procedure Laterality Date  . Vasectomy    . Icd implantation  06/12/08    [employer]tronic biventricular ICD, remote - no, Dr. Ladona Ridgel  . Cardiac catheterization  02/03/03    Dr. Jenne Campus   . Insert / replace / remove pacemaker      pacer/ICD    Family History  Problem Relation Age of Onset  . Cancer Mother     unclear what type  . Cancer Father     stomach    Social History:  reports that he has been passively smoking.  He has never used smokeless tobacco. He reports that he does not drink alcohol or use illicit drugs.  Allergies:  Allergies  Allergen Reactions  .  Penicillins     REACTION: Rash    Medications:  Prior to Admission medications   Medication Sig Start Date End Date Taking? Authorizing Provider  acetaminophen (TYLENOL) 500 MG tablet Take 1,000 mg by mouth every 6 (six) hours as needed for pain.   Yes Historical Provider, MD  allopurinol (ZYLOPRIM) 100 MG tablet Take 1 tablet by mouth every evening.  12/16/11  Yes Historical Provider, MD  amiodarone (PACERONE) 200 MG tablet Take 200 mg by mouth 2 (two) times daily.    Yes Historical Provider, MD  apixaban (ELIQUIS) 5 MG TABS tablet Take 5 mg by mouth 2 (two) times daily.   Yes Historical Provider, MD  atorvastatin (LIPITOR) 20 MG tablet Take 20 mg by mouth daily.   Yes Historical Provider, MD  Cholecalciferol 10000 UNITS CAPS Take 1 capsule by mouth daily.   Yes  Historical Provider, MD  digoxin (LANOXIN) 0.125 MG tablet Take 0.125 mg by mouth every evening.   Yes Historical Provider, MD  donepezil (ARICEPT) 10 MG tablet Take 10 mg by mouth daily.   Yes Historical Provider, MD  furosemide (LASIX) 40 MG tablet Take 1 tablet (40 mg total) by mouth 2 (two) times daily. 12/02/12  Yes Luke K Kilroy, PA-C  lisinopril (PRINIVIL,ZESTRIL) 5 MG tablet Take 2.5 mg by mouth daily.   Yes Historical Provider, MD  metoprolol (LOPRESSOR) 100 MG tablet Take 0.5 tablets (50 mg total) by mouth 2 (two) times daily. 12/02/12  Yes Abelino Derrick, PA-C     Results for orders placed during the hospital encounter of 01/10/13 (from the past 48 hour(s))  CBC     Status: Abnormal   Collection Time    01/10/13 10:04 PM      Result Value Range   WBC 7.7  4.0 - 10.5 K/uL   RBC 4.80  4.22 - 5.81 MIL/uL   Hemoglobin 14.4  13.0 - 17.0 g/dL   HCT 16.1  09.6 - 04.5 %   MCV 88.3  78.0 - 100.0 fL   MCH 30.0  26.0 - 34.0 pg   MCHC 34.0  30.0 - 36.0 g/dL   RDW 40.9 (*) 81.1 - 91.4 %   Platelets 209  150 - 400 K/uL  BASIC METABOLIC PANEL     Status: Abnormal   Collection Time    01/10/13 10:04 PM      Result Value Range   Sodium 143  135 - 145 mEq/L   Potassium 4.9  3.5 - 5.1 mEq/L   Chloride 107  96 - 112 mEq/L   CO2 26  19 - 32 mEq/L   Glucose, Bld 89  70 - 99 mg/dL   BUN 32 (*) 6 - 23 mg/dL   Creatinine, Ser 7.82 (*) 0.50 - 1.35 mg/dL   Calcium 9.0  8.4 - 95.6 mg/dL   GFR calc non Af Amer 33 (*) >90 mL/min   GFR calc Af Amer 38 (*) >90 mL/min   Comment: (NOTE)     The eGFR has been calculated using the CKD EPI equation.     This calculation has not been validated in all clinical situations.     eGFR's persistently <90 mL/min signify possible Chronic Kidney     Disease.  PRO B NATRIURETIC PEPTIDE     Status: Abnormal   Collection Time    01/10/13 10:04 PM      Result Value Range   Pro B Natriuretic peptide (BNP) 27226.0 (*) 0 - 125 pg/mL  DIGOXIN LEVEL  Status: None    Collection Time    01/10/13 10:10 PM      Result Value Range   Digoxin Level 1.0  0.8 - 2.0 ng/mL  POCT I-STAT TROPONIN I     Status: None   Collection Time    01/10/13 10:19 PM      Result Value Range   Troponin i, poc 0.04  0.00 - 0.08 ng/mL   Comment 3            Comment: Due to the release kinetics of cTnI,     a negative result within the first hours     of the onset of symptoms does not rule out     myocardial infarction with certainty.     If myocardial infarction is still suspected,     repeat the test at appropriate intervals.  D-DIMER, QUANTITATIVE     Status: Abnormal   Collection Time    01/11/13  1:49 AM      Result Value Range   D-Dimer, Quant 1.71 (*) 0.00 - 0.48 ug/mL-FEU   Comment:            AT THE INHOUSE ESTABLISHED CUTOFF     VALUE OF 0.48 ug/mL FEU,     THIS ASSAY HAS BEEN DOCUMENTED     IN THE LITERATURE TO HAVE     A SENSITIVITY AND NEGATIVE     PREDICTIVE VALUE OF AT LEAST     98 TO 99%.  THE TEST RESULT     SHOULD BE CORRELATED WITH     AN ASSESSMENT OF THE CLINICAL     PROBABILITY OF DVT / VTE.  TROPONIN I     Status: None   Collection Time    01/11/13  4:18 AM      Result Value Range   Troponin I <0.30  <0.30 ng/mL   Comment:            Due to the release kinetics of cTnI,     a negative result within the first hours     of the onset of symptoms does not rule out     myocardial infarction with certainty.     If myocardial infarction is still suspected,     repeat the test at appropriate intervals.  CBC WITH DIFFERENTIAL     Status: Abnormal   Collection Time    01/11/13  4:20 AM      Result Value Range   WBC 7.8  4.0 - 10.5 K/uL   RBC 4.80  4.22 - 5.81 MIL/uL   Hemoglobin 14.4  13.0 - 17.0 g/dL   HCT 16.1  09.6 - 04.5 %   MCV 88.3  78.0 - 100.0 fL   MCH 30.0  26.0 - 34.0 pg   MCHC 34.0  30.0 - 36.0 g/dL   RDW 40.9 (*) 81.1 - 91.4 %   Platelets 191  150 - 400 K/uL   Neutrophils Relative % 60  43 - 77 %   Neutro Abs 4.7  1.7 - 7.7  K/uL   Lymphocytes Relative 22  12 - 46 %   Lymphs Abs 1.7  0.7 - 4.0 K/uL   Monocytes Relative 11  3 - 12 %   Monocytes Absolute 0.9  0.1 - 1.0 K/uL   Eosinophils Relative 7 (*) 0 - 5 %   Eosinophils Absolute 0.5  0.0 - 0.7 K/uL   Basophils Relative 1  0 - 1 %   Basophils Absolute 0.0  0.0 - 0.1 K/uL  COMPREHENSIVE METABOLIC PANEL     Status: Abnormal   Collection Time    01/11/13  4:20 AM      Result Value Range   Sodium 144  135 - 145 mEq/L   Potassium 4.5  3.5 - 5.1 mEq/L   Chloride 109  96 - 112 mEq/L   CO2 24  19 - 32 mEq/L   Glucose, Bld 91  70 - 99 mg/dL   BUN 32 (*) 6 - 23 mg/dL   Creatinine, Ser 6.57 (*) 0.50 - 1.35 mg/dL   Calcium 8.9  8.4 - 84.6 mg/dL   Total Protein 6.3  6.0 - 8.3 g/dL   Albumin 3.6  3.5 - 5.2 g/dL   AST 27  0 - 37 U/L   ALT 25  0 - 53 U/L   Alkaline Phosphatase 85  39 - 117 U/L   Total Bilirubin 1.7 (*) 0.3 - 1.2 mg/dL   GFR calc non Af Amer 34 (*) >90 mL/min   GFR calc Af Amer 40 (*) >90 mL/min   Comment: (NOTE)     The eGFR has been calculated using the CKD EPI equation.     This calculation has not been validated in all clinical situations.     eGFR's persistently <90 mL/min signify possible Chronic Kidney     Disease.  MRSA PCR SCREENING     Status: None   Collection Time    01/11/13  5:47 AM      Result Value Range   MRSA by PCR NEGATIVE  NEGATIVE   Comment:            The GeneXpert MRSA Assay (FDA     approved for NASAL specimens     only), is one component of a     comprehensive MRSA colonization     surveillance program. It is not     intended to diagnose MRSA     infection nor to guide or     monitor treatment for     MRSA infections.    Dg Chest 2 View  01/10/2013   CLINICAL DATA:  Shortness of breath  EXAM: CHEST  2 VIEW  COMPARISON:  11/28/2012  FINDINGS: No adverse features related to biventricular ICD/ pacer via left approach. No change in cardiopericardial enlargement.  Small bilateral pleural effusions or pleural  scarring. No overt edema. Chronic lower lung opacities, particularly retrocardiac, were there is likely scarring. Right nipple shadow again noted.  IMPRESSION: 1. Cardiomegaly without edema. 2. Chronic small bilateral pleural effusions or pleural scarring. 3. Chronic lower lung scarring and atelectasis.   Electronically Signed   By: Tiburcio Pea   On: 01/10/2013 23:06    Review of Systems  Respiratory: Positive for shortness of breath.   Cardiovascular: Positive for orthopnea and PND. Negative for chest pain and leg swelling.  All other systems reviewed and are negative.   Blood pressure 113/83, pulse 110, temperature 97.5 F (36.4 C), temperature source Oral, resp. rate 28, height 5\' 11"  (1.803 m), weight 171 lb 4.8 oz (77.7 kg), SpO2 97.00%. Physical Exam  Constitutional: He is oriented to person, place, and time. He appears well-developed and well-nourished. No distress.  Neck: JVD present. Carotid bruit is not present.  Cardiovascular: Normal rate.  An irregularly irregular rhythm present.  Murmur (3/6 MR murmur best heard at apex) heard. Pulses:      Radial pulses are 2+ on the right side, and 2+ on the left side.  Dorsalis pedis pulses are 2+ on the right side, and 2+ on the left side.  Respiratory: Effort normal. No respiratory distress. He has no wheezes. He has rales (mild bibasilar rales).  GI: Soft. Bowel sounds are normal. He exhibits mass. He exhibits no distension. There is no tenderness.  Musculoskeletal: He exhibits no edema.  Neurological: He is alert and oriented to person, place, and time.  Skin: Skin is warm and dry. He is not diaphoretic.  Psychiatric: He has a normal mood and affect. His behavior is normal.    Assessment/Plan: Principal Problem:   Acute on chronic systolic heart failure Active Problems:   Nonischemic cardiomyopathy,  EF 35-40% by echo 07/2012.     VENTRICULAR TACHYCARDIA- on Amiodarone   Atrial fibrillation   CKD (chronic kidney disease)  stage 3, GFR 30-59 ml/min   Chest pain   Plan: Admitted for A/C CHF. His weight is up to 171 lb. Dry weight is 160. Will continue with IV diuretics. According to admission note, the patient had chest pain. He denied any chest pain on this encounter and stated that he came in due to SOB. However, will continue to cycle troponins to rule out MI. Will get device interrogated. Will repeat 2D echo. MD to follow.   SIMMONS, BRITTAINY 01/11/2013, 9:49 AM     I have seen and examined the patient along with SIMMONS, BRITTAINY, PA.  I have reviewed the chart, notes and new data.  I agree with PA's note.  Key new complaints: improved, but still has mild dyspnea at rest Key examination changes: irregular rhythmelevated JVP 8 cm, +S3, 3/6 holosystolic murmur across entire precordium (MR and TR?), precordial heave Key new findings / data: creat 1.85 (baseline) Reviewed report of recent Coronary angio in [place] - no stenoses to explain his cardiomyopathy  PLAN: Interrogate his CRT-D device - he appears to have poor BiV pacing efficiency just by looking at the monitor, due to AF with RVR. On his visit with Dr. Ladona Ridgel in July, burden was only 1.2%. Suspect AF burden is now higher He thinks he gained 5 lb, by our records he is 11-12 lb above dry weight. Repeat echo. Suspect severe MR. Continue diuretics.   Thurmon Fair, MD, [facility] [phone] 01/11/2013, 11:00 AM

## 2013-01-11 NOTE — Consult Note (Signed)
ELECTROPHYSIOLOGY CONSULT NOTE    Patient ID: ASTOR GENTLE MRN: [id number], DOB/AGE: Nov 24, 1940 72 y.o.  Admit date: 01/10/2013 Date of Consult: 01-11-2013  Primary Cardiologist: Franchot Heidelberg, MD  Reason for Consultation: atrial arrhythmias  HPI:  Mr. Dansby is a 72 year old male with a past medical history significant for nonischemic cardiomyopathy, hypertension, prior CVA, systolic heart failure and persistent atrial fibrillation.  He has had a long standing history of atrial fibrillation and has been maintained on low dose Amiodarone.  He has had recurrence of his atrial fibrillation noted with atrial undersensing of his device in July of this year.  His afib had previously been well controlled.  At that time, his Amiodarone dose was increased and he was referred to Dr Ladona Ridgel for further evaluation.  Recommendations were to continue Amiodarone. He was not felt to be a candidate for ablation at that time.    He was then readmitted in August for heart failure. He was in atrial fibrillation at that time.  His device was reprogrammed to VVIR during that admission to prevent inappropriate atrial pacing.  He had done well for several weeks after discharge, but had an increase in shortness of breath over the last several days. He called the on call service who advised him to come to the ER for further evaluation.  This admission he has been found to be in recurrent atrial fibrillation which is resulting in decreased biventricular pacing.  Lab work is notable for a BNP >27,000, creatinine 1.93, cardiac enzyme negative, D-dimer 1.71.  Last echo in April of this year demonstrated an EF of 35-40%, severe hypokinesis of the inferolateral myocardium, akinesis of the inferior myocardium, moderate to severe MR, LA 55.   Repeat echo this admission demonstrates EF 20-25%, severe MR with malcoaptation of leafletes, bilteral atrial enlaragement, LA 66, moderate TR, PA pressure 55.   Device interrogation  today demonstrates estimated longevity of 4.1 years.  Device programmed VVIR 70/120.  RA measurements: fib wave 0.1 with predominately undersensing when programmed at max sensitivity, impedence 456.  R wave 13, impedence 475, threshold 0.625V@0 . , LV threshold 1.875V @ 0.95msec, impedence 228.  No ventricular arrhythmias noted.  Pt with increased afib burden since June of this year. Unable to tell with certainty percentage of afib with atrial undersensing. With increase in afib burden, activity level has declined, Optivol is elevated, night heart rates are elevated.  Underlying rhythm appears to be atypical atrial flutter with cycle length around . Pt has had multiple heart failure admissions over the last several months.    Review of chest x-rays demonstrate that on 02-27-2011, his atrial lead appeared to be in normal position.  CXR from 10-2012 demonstrates atrial lead dislodgement. His atrial lead was implanted in 2004 ([id number]).  He reports that while [place] the beach 6[place], he was hospitalized for CHF.  He apparently had a right heart cath at that time.  He denies recent fevers, chills, nausea, vomiting, or chest pain.  He does endorse significant shortness of breath.  He feels that he is improving.   EP has been asked to evaluate for treatment options.  ROS is negative except as outlined above.   Past Medical History  Diagnosis Date  . Hypertension   . Chronic tension headaches   . Tinea cruris   . Tinea unguium   . Hernia, hiatal   . CVA (cerebral infarction)   . Systolic heart failure     EF 91-47% in 08/2012 (Echo  at [facility], [place])  . Bradycardia   . Ventricular tachycardia   . Syncope   . CAD (coronary artery disease)     h/o MI in 2001, admitted for possible STEMI at [facility] in 08/2012 - Cardiac cath at [facility] in 08/2012: Nonobstructive coronary artery disease  . Sleep disorder   . Atrial fibrillation     On Eliquis, new onset in 08/2012 - successful  cardioversion to sinus rhythm  . Automatic implantable cardioverter-defibrillator in situ   . Pacemaker     ICD/PACEMAKER   . Arthritis     HANDS  . CHF (congestive heart failure)   . Myocardial infarction   . Anginal pain   . Depression   . Shortness of breath   . Pneumonia   . Heart murmur   . Stroke      Surgical History:  Past Surgical History  Procedure Laterality Date  . Vasectomy    . Icd implantation  06/12/08    Medtronic biventricular ICD, remote - no, Dr. Ladona Ridgel  . Cardiac catheterization  02/03/03    Dr. Jenne Campus   . Insert / replace / remove pacemaker      pacer/ICD     Prescriptions prior to admission  Medication Sig Dispense Refill  . acetaminophen (TYLENOL) 500 MG tablet Take 1,000 mg by mouth every 6 (six) hours as needed for pain.      Marland Kitchen allopurinol (ZYLOPRIM) 100 MG tablet Take 1 tablet by mouth every evening.       Marland Kitchen amiodarone (PACERONE) 200 MG tablet Take 200 mg by mouth 2 (two) times daily.       Marland Kitchen apixaban (ELIQUIS) 5 MG TABS tablet Take 5 mg by mouth 2 (two) times daily.      Marland Kitchen atorvastatin (LIPITOR) 20 MG tablet Take 20 mg by mouth daily.      . Cholecalciferol 10000 UNITS CAPS Take 1 capsule by mouth daily.      . digoxin (LANOXIN) 0.125 MG tablet Take 0.125 mg by mouth every evening.      . donepezil (ARICEPT) 10 MG tablet Take 10 mg by mouth daily.      . furosemide (LASIX) 40 MG tablet Take 1 tablet (40 mg total) by mouth 2 (two) times daily.  60 tablet  11  . lisinopril (PRINIVIL,ZESTRIL) 5 MG tablet Take 2.5 mg by mouth daily.      . metoprolol (LOPRESSOR) 100 MG tablet Take 0.5 tablets (50 mg total) by mouth 2 (two) times daily.        Inpatient Medications:  . allopurinol  100 mg Oral QPM  . amiodarone  200 mg Oral BID  . apixaban  5 mg Oral BID  . atorvastatin  20 mg Oral Daily  . digoxin  0.125 mg Oral QPM  . donepezil  10 mg Oral Daily  . [START ON 01/12/2013] influenza vac split quadrivalent PF  0.5 mL Intramuscular Tomorrow-1000    . lisinopril  2.5 mg Oral Daily  . metoprolol  50 mg Oral BID    Allergies:  Allergies  Allergen Reactions  . Penicillins     REACTION: Rash    History   Social History  . Marital Status: Married    Spouse Name: N/A    Number of Children: N/A  . Years of Education: N/A   Occupational History  . Retired    Social History Main Topics  . Smoking status: Passive Smoke Exposure - Never Smoker  . Smokeless tobacco: Never  Used     Comment: denies tobacco use   . Alcohol Use: No  . Drug Use: No  . Sexual Activity: Not on file   Other Topics Concern  . Not on file   Social History Narrative   Married and lives in [place].     Family History  Problem Relation Age of Onset  . Cancer Mother     unclear what type  . Cancer Father     stomach    Physical Exam: Filed Vitals:   01/11/13 1600 01/11/13 1700 01/11/13 1800 01/11/13 1823  BP: 100/78  124/96   Pulse: 91 81 105 85  Temp: 97.3 F (36.3 C)     TempSrc: Oral     Resp: 27 19 19    Height:      Weight:      SpO2: 98% 97% 95%     GEN- The patient is chronically ill appearing, alert and oriented x 3 today.   Head- normocephalic, atraumatic Eyes-  Sclera clear, conjunctiva pink Ears- hearing intact Oropharynx- clear Neck- supple, + JVD Lungs- tachypnea, bibasilar rales Heart- irregular rate and rhythm, 2/6 SEM at the apex GI- soft, NT, ND, + BS Extremities- no clubbing, cyanosis, or edema MS- no significant deformity or atrophy Skin- no rash or lesion Psych- euthymic mood, full affect Neuro- strength and sensation are intact ICD pocket is well healed  Labs:   Lab Results  Component Value Date   WBC 7.8 01/11/2013   HGB 14.4 01/11/2013   HCT 42.4 01/11/2013   MCV 88.3 01/11/2013   PLT 191 01/11/2013    Recent Labs Lab 01/11/13 0420  NA 144  K 4.5  CL 109  CO2 24  BUN 32*  CREATININE 1.85*  CALCIUM 8.9  PROT 6.3  BILITOT 1.7*  ALKPHOS 85  ALT 25  AST 27  GLUCOSE 91    Lab Results   Component Value Date   DDIMER 1.71* 01/11/2013     Radiology/Studies: Dg Chest 2 View 01/10/2013   CLINICAL DATA:  Shortness of breath  EXAM: CHEST  2 VIEW  COMPARISON:  11/28/2012  FINDINGS: No adverse features related to biventricular ICD/ pacer via left approach. No change in cardiopericardial enlargement.  Small bilateral pleural effusions or pleural scarring. No overt edema. Chronic lower lung opacities, particularly retrocardiac, were there is likely scarring. Right nipple shadow again noted.  IMPRESSION: 1. Cardiomegaly without edema. 2. Chronic small bilateral pleural effusions or pleural scarring. 3. Chronic lower lung scarring and atelectasis.   Electronically Signed   By: Tiburcio Pea   On: 01/10/2013 23:06   EKG: atypical atrial flutter with intermittent ventricular pacing  TELEMETRY:  Atrial fibrillation with controlled ventricular response, intermittent ventricular pacing  Assessment and Plan:  1.  Atypical atrial flutter and atrial fibrillation The patient has struggled with atrial arrhythmias over the past few months.  This temporally corresponds to worsening CHF.  He has maintained sinus rhythm previously with low dose amiodarone.  I am cautiously optimistic that he may maintain sinus rhythm with cardioversion.  My biggest concern is with his severe MR and LA enlargement.  This will significantly reduce his ability to maintain sinus rhythm. I would recommend cardioversion by primary team tomorrow.  The patient is appropriately anticoagulated with eliquis at this time.  2. Atrial lead dislodgement The patients chest xray clearly reveals that his atrial lead is dislodged.  It appears to likely be nonfunctional.  We will reassess once in sinus rhythm.  If he  is able to maintain sinus and his lead does not work then he may require lead revision.  If he does not maintain sinus rhythm then this may not be necessary.    3. Nonischemic CM The patient has a BiV ICD in place.  CRT is  presently limited by afib and elevated V rates We will try to achieve sinus rhythm as above.  I think that he would likely benefit from evaluation by the advanced heart failure team.  Consider consultation after cardioversion.  4. Acute on chronic renal failure Limits our antiarrhythmic options  5. Severe MR/ LA enlargement Will make sinus rhythm difficult. If we can achieve sinus, then his MR may improve with treatment of his CHF and his LA might even remodel a bit.  IF we cannot maintain sinus rhythm with amiodarone then perhaps we should consider referral to [facility] for mitral repair and surgical MAZE.  We will reserve this option until after we have assessed our ability to treat this patient medically.

## 2013-01-11 NOTE — Progress Notes (Signed)
INITIAL NUTRITION ASSESSMENT  DOCUMENTATION CODES Per approved criteria  -Not Applicable   INTERVENTION: No nutrition intervention at this time ---> patient declined RD to follow for nutrition care plan  NUTRITION DIAGNOSIS: Inadequate oral intake related to poor appetite as evidenced by patient report  Goal: Pt to meet >/= 90% of their estimated nutrition needs   Monitor:  PO intake, weight, labs, I/O's  Reason for Assessment: Malnutrition Screening Tool Report  72 y.o. male  Admitting Dx: Chest pain  ASSESSMENT: Patient with PMH of HTN, CVA, nonischemic cardiomyopathy and ventricle tachycardia with ICD insertion; recently hospitalized for acute on chronic systolic heart failure; presented with worsening shortness of breath.  Patient sleepy upon RD visit; reports his appetite isn't very good at present as well as PTA (for a couple of weeks); he endorses weight gain (per H&P he's gained 6 lbs in the past week); he essentially declined addition of supplements; asked for a Sprite.  Height: Ht Readings from Last 1 Encounters:  01/11/13 5\' 11"  (1.803 m)    Weight: Wt Readings from Last 1 Encounters:  01/11/13 171 lb 4.8 oz (77.7 kg)    Ideal Body Weight: 172 lb  % Ideal Body Weight: 99%  Wt Readings from Last 10 Encounters:  01/11/13 171 lb 4.8 oz (77.7 kg)  12/09/12 161 lb 12.8 oz (73.392 kg)  12/02/12 160 lb 3.2 oz (72.666 kg)  11/03/12 166 lb (75.297 kg)  10/23/12 165 lb 6.4 oz (75.025 kg)  02/24/12 154 lb (69.854 kg)  02/24/12 154 lb (69.854 kg)  02/05/12 176 lb (79.833 kg)  01/30/12 178 lb (80.74 kg)  09/26/10 184 lb (83.462 kg)    Usual Body Weight: 161 lb  % Usual Body Weight: 106%  BMI:  Body mass index is 23.9 kg/(m^2).  Estimated Nutritional Needs: Kcal: 1900-2100 Protein: 95-105 gm Fluid: 1.9-2.1 L  Skin: Intact  Diet Order: Cardiac  EDUCATION NEEDS: -No education needs identified at this time   Intake/Output Summary (Last 24 hours)  at 01/11/13 1241 Last data filed at 01/11/13 1000  Gross per 24 hour  Intake      0 ml  Output   1000 ml  Net  -1000 ml    Labs:   Recent Labs Lab 01/10/13 2204 01/11/13 0420  NA 143 144  K 4.9 4.5  CL 107 109  CO2 26 24  BUN 32* 32*  CREATININE 1.93* 1.85*  CALCIUM 9.0 8.9  GLUCOSE 89 91    Scheduled Meds: . allopurinol  100 mg Oral QPM  . amiodarone  200 mg Oral BID  . apixaban  5 mg Oral BID  . atorvastatin  20 mg Oral Daily  . digoxin  0.125 mg Oral QPM  . donepezil  10 mg Oral Daily  . [START ON 01/12/2013] influenza vac split quadrivalent PF  0.5 mL Intramuscular Tomorrow-1000  . lisinopril  2.5 mg Oral Daily  . metoprolol  50 mg Oral BID    Continuous Infusions:   Past Medical History  Diagnosis Date  . Hypertension   . Chronic tension headaches   . Tinea cruris   . Tinea unguium   . Hernia, hiatal   . CVA (cerebral infarction)   . Systolic heart failure     EF 16-10% in 08/2012 (Echo at [facility], [place])  . Bradycardia   . Ventricular tachycardia   . Syncope   . CAD (coronary artery disease)     h/o MI in 2001, admitted for possible STEMI at [facility]  [place] in 08/2012 - Cardiac cath at [facility] in 08/2012: Nonobstructive coronary artery disease  . Sleep disorder   . Atrial fibrillation     On Eliquis, new onset in 08/2012 - successful cardioversion to sinus rhythm  . Automatic implantable cardioverter-defibrillator in situ   . Pacemaker     ICD/PACEMAKER   . Arthritis     HANDS  . CHF (congestive heart failure)   . Myocardial infarction   . Anginal pain   . Depression   . Shortness of breath   . Pneumonia   . Heart murmur   . Stroke     Past Surgical History  Procedure Laterality Date  . Vasectomy    . Icd implantation  06/12/08    Medtronic biventricular ICD, remote - no, Dr. Ladona Ridgel  . Cardiac catheterization  02/03/03    Dr. Jenne Campus   . Insert / replace / remove pacemaker      pacer/ICD    Maureen Chatters, RD, LDN Pager #:  [phone] After-Hours Pager #: [phone]

## 2013-01-11 NOTE — Telephone Encounter (Signed)
Called by pt's wife regarding worsening SOB and chest discomfort. Recommended seek emergency care. At the time of writing this, noted that pt was admitted to [facility].

## 2013-01-11 NOTE — Progress Notes (Signed)
Discussed case with cardiology [facility] and per our conversation Cardiology will kindly take over.  We will be available for any further questions should you have any concerning his other medical needs.  Arrielle Mcginn, [employer]

## 2013-01-11 NOTE — ED Provider Notes (Signed)
CSN: [id number]     Arrival date & time 01/10/13  2155 History   First MD Initiated Contact with Patient 01/10/13 2302     Chief Complaint  Patient presents with  . Shortness of Breath   (Consider location/radiation/quality/duration/timing/severity/associated sxs/prior Treatment) Patient is a 72 y.o. male presenting with shortness of breath. The history is provided by the patient. No language interpreter was used.  Shortness of Breath Severity:  Moderate Onset quality:  Gradual Timing:  Constant Progression:  Worsening Chronicity:  Recurrent Context: not smoke exposure and not URI   Relieved by:  Nothing Worsened by:  Nothing tried Ineffective treatments:  Rest Associated symptoms: chest pain   Associated symptoms: no fever, no hemoptysis, no neck pain and no wheezing   Risk factors: no recent surgery     Past Medical History  Diagnosis Date  . Hypertension   . Chronic tension headaches   . Tinea cruris   . Tinea unguium   . Hernia, hiatal   . CVA (cerebral infarction)   . Systolic heart failure     EF 11-91% in 08/2012 (Echo at [facility], [place])  . Bradycardia   . Ventricular tachycardia   . Syncope   . CAD (coronary artery disease)     h/o MI in 2001, admitted for possible STEMI at [facility] in 08/2012 - Cardiac cath at [facility] in 08/2012: Nonobstructive coronary artery disease  . Sleep disorder   . Atrial fibrillation     On Eliquis, new onset in 08/2012 - successful cardioversion to sinus rhythm  . Automatic implantable cardioverter-defibrillator in situ   . Pacemaker     ICD/PACEMAKER   . Arthritis     HANDS  . CHF (congestive heart failure)   . Myocardial infarction   . Anginal pain   . Depression   . Shortness of breath   . Pneumonia    Past Surgical History  Procedure Laterality Date  . Vasectomy    . Icd implantation  06/12/08    Medtronic biventricular ICD, remote - no, Dr. Ladona Ridgel  . Cardiac catheterization  02/03/03    Dr. Jenne Campus   .  Insert / replace / remove pacemaker      pacer/ICD   Family History  Problem Relation Age of Onset  . Cancer Mother     unclear what type  . Cancer Father     stomach   History  Substance Use Topics  . Smoking status: Passive Smoke Exposure - Never Smoker  . Smokeless tobacco: Never Used     Comment: denies tobacco use   . Alcohol Use: No    Review of Systems  Constitutional: Negative for fever.  HENT: Negative for neck pain.   Eyes: Negative for photophobia.  Respiratory: Positive for chest tightness and shortness of breath. Negative for hemoptysis and wheezing.   Cardiovascular: Positive for chest pain. Negative for palpitations and leg swelling.  All other systems reviewed and are negative.    Allergies  Penicillins  Home Medications   Current Outpatient Rx  Name  Route  Sig  Dispense  Refill  . acetaminophen (TYLENOL) 500 MG tablet   Oral   Take 1,000 mg by mouth every 6 (six) hours as needed for pain.         Marland Kitchen allopurinol (ZYLOPRIM) 100 MG tablet   Oral   Take 1 tablet by mouth every evening.          Marland Kitchen amiodarone (PACERONE) 200 MG tablet   Oral  Take 200 mg by mouth 2 (two) times daily.          Marland Kitchen apixaban (ELIQUIS) 5 MG TABS tablet   Oral   Take 5 mg by mouth 2 (two) times daily.         Marland Kitchen atorvastatin (LIPITOR) 20 MG tablet   Oral   Take 20 mg by mouth daily.         . Cholecalciferol 10000 UNITS CAPS   Oral   Take 1 capsule by mouth daily.         . digoxin (LANOXIN) 0.125 MG tablet   Oral   Take 0.125 mg by mouth every evening.         . donepezil (ARICEPT) 10 MG tablet   Oral   Take 10 mg by mouth daily.         . furosemide (LASIX) 40 MG tablet   Oral   Take 1 tablet (40 mg total) by mouth 2 (two) times daily.   60 tablet   11   . lisinopril (PRINIVIL,ZESTRIL) 5 MG tablet   Oral   Take 2.5 mg by mouth daily.         . metoprolol (LOPRESSOR) 100 MG tablet   Oral   Take 0.5 tablets (50 mg total) by mouth 2  (two) times daily.          BP 119/93  Pulse 108  Temp(Src) 97.9 F (36.6 C) (Oral)  Resp 24  Wt 172 lb (78.019 kg)  BMI 24 kg/m2  SpO2 97% Physical Exam  Constitutional: He is oriented to person, place, and time. He appears well-developed and well-nourished. No distress.  HENT:  Head: Normocephalic and atraumatic.  Mouth/Throat: Oropharynx is clear and moist.  Eyes: Conjunctivae are normal. Pupils are equal, round, and reactive to light.  Neck: Normal range of motion. Neck supple.  Cardiovascular: Normal rate, regular rhythm and intact distal pulses.   Pulmonary/Chest: He has decreased breath sounds. He has no wheezes.  Abdominal: Soft. Bowel sounds are normal. There is no tenderness. There is no rebound and no guarding.  Musculoskeletal: Normal range of motion. He exhibits no edema and no tenderness.  Neurological: He is alert and oriented to person, place, and time.  Skin: Skin is warm.  Psychiatric: He has a normal mood and affect.    ED Course  Procedures (including critical care time) Labs Review Labs Reviewed  CBC - Abnormal; Notable for the following:    RDW 16.4 (*)    All other components within normal limits  BASIC METABOLIC PANEL - Abnormal; Notable for the following:    BUN 32 (*)    Creatinine, Ser 1.93 (*)    GFR calc non Af Amer 33 (*)    GFR calc Af Amer 38 (*)    All other components within normal limits  PRO B NATRIURETIC PEPTIDE - Abnormal; Notable for the following:    Pro B Natriuretic peptide (BNP) 27226.0 (*)    All other components within normal limits  DIGOXIN LEVEL  POCT I-STAT TROPONIN I   Imaging Review Dg Chest 2 View  01/10/2013   CLINICAL DATA:  Shortness of breath  EXAM: CHEST  2 VIEW  COMPARISON:  11/28/2012  FINDINGS: No adverse features related to biventricular ICD/ pacer via left approach. No change in cardiopericardial enlargement.  Small bilateral pleural effusions or pleural scarring. No overt edema. Chronic lower lung  opacities, particularly retrocardiac, were there is likely scarring. Right nipple shadow again noted.  IMPRESSION: 1. Cardiomegaly without edema. 2. Chronic small bilateral pleural effusions or pleural scarring. 3. Chronic lower lung scarring and atelectasis.   Electronically Signed   By: Tiburcio Pea   On: 01/10/2013 23:06    MDM  No diagnosis found.  Date: 01/11/2013  Rate: 109  Rhythm: atrial paced  QRS Axis: normal  Intervals: QT prolonged  ST/T Wave abnormalities: nonspecific ST changes  Conduction Disutrbances:none  Narrative Interpretation:   Old EKG Reviewed: none available    Plan admit for Cp r/o  Osby Sweetin Smitty Cords, MD 01/11/13 0530

## 2013-01-11 NOTE — Progress Notes (Signed)
  Echocardiogram 2D Echocardiogram has been performed.  [place] 01/11/2013, 3:02 PM

## 2013-01-11 NOTE — H&P (Signed)
[facility] History and Physical  Patient: Nathan Mckinney  MVH:[id number]  DOB: October 09, 1940  DOA: 01/10/2013  Referring physician: Dr. Nicanor Alcon PCP: Pcp Not In System  Consults: Treatment Team:  Governor Rooks, MD   Chief Complaint: Shortness of breath  HPI: Nathan Mckinney is a 72 y.o. male with Past medical history of hypertension, CVA, nonischemic cardiomyopathy with ejection fraction of 15%, history of ventricle tachycardia with ICD insertion. Patient was recently hospitalized for acute on chronic systolic heart failure. He was discharged 2 weeks ago. After the discharge she started having trouble breathing again which is gradually worsening and worse worse last night. He also complains of intermittent chest pain that has been ongoing ever since last one year the chest pain is located substernally and lasts for a few minutes and resolved on its own.He mentions that he has gained 6 pounds in the last 1 week. He otherwise is able to speak in full sentences but gets out of breath every few minutes. He denies any complaint of leg swelling or abdominal distention he denies any complaint of palpitation, dizziness, lightheadedness, nausea, vomiting, diarrhea, constipation, active bleeding. He does have orthopnea but denies PND.   Review of Systems: as mentioned in the history of present illness.  A Comprehensive review of the other systems is negative.  Past Medical History  Diagnosis Date  . Hypertension   . Chronic tension headaches   . Tinea cruris   . Tinea unguium   . Hernia, hiatal   . CVA (cerebral infarction)   . Systolic heart failure     EF 84-13% in 08/2012 (Echo at [facility] [place])  . Bradycardia   . Ventricular tachycardia   . Syncope   . CAD (coronary artery disease)     h/o MI in 2001, admitted for possible STEMI at [facility] in 08/2012 - Cardiac cath at [facility] in 08/2012: Nonobstructive coronary artery disease  . Sleep disorder   . Atrial  fibrillation     On Eliquis, new onset in 08/2012 - successful cardioversion to sinus rhythm  . Automatic implantable cardioverter-defibrillator in situ   . Pacemaker     ICD/PACEMAKER   . Arthritis     HANDS  . CHF (congestive heart failure)   . Myocardial infarction   . Anginal pain   . Depression   . Shortness of breath   . Pneumonia    Past Surgical History  Procedure Laterality Date  . Vasectomy    . Icd implantation  06/12/08    [employer] biventricular ICD, remote - no, Dr. Ladona Ridgel  . Cardiac catheterization  02/03/03    Dr. Jenne Campus   . Insert / replace / remove pacemaker      pacer/ICD   Social History:  reports that he has been passively smoking.  He has never used smokeless tobacco. He reports that he does not drink alcohol or use illicit drugs. Patient is coming from home. Independent for most of his  ADL.  Allergies  Allergen Reactions  . Penicillins     REACTION: Rash    Family History  Problem Relation Age of Onset  . Cancer Mother     unclear what type  . Cancer Father     stomach    Prior to Admission medications   Medication Sig Start Date End Date Taking? Authorizing Provider  acetaminophen (TYLENOL) 500 MG tablet Take 1,000 mg by mouth every 6 (six) hours as needed for pain.   Yes Historical Provider, MD  allopurinol (ZYLOPRIM) 100 MG tablet Take 1 tablet by mouth every evening.  12/16/11  Yes Historical Provider, MD  amiodarone (PACERONE) 200 MG tablet Take 200 mg by mouth 2 (two) times daily.    Yes Historical Provider, MD  apixaban (ELIQUIS) 5 MG TABS tablet Take 5 mg by mouth 2 (two) times daily.   Yes Historical Provider, MD  atorvastatin (LIPITOR) 20 MG tablet Take 20 mg by mouth daily.   Yes Historical Provider, MD  Cholecalciferol 10000 UNITS CAPS Take 1 capsule by mouth daily.   Yes Historical Provider, MD  digoxin (LANOXIN) 0.125 MG tablet Take 0.125 mg by mouth every evening.   Yes Historical Provider, MD  donepezil (ARICEPT) 10 MG tablet  Take 10 mg by mouth daily.   Yes Historical Provider, MD  furosemide (LASIX) 40 MG tablet Take 1 tablet (40 mg total) by mouth 2 (two) times daily. 12/02/12  Yes Luke K Kilroy, PA-C  lisinopril (PRINIVIL,ZESTRIL) 5 MG tablet Take 2.5 mg by mouth daily.   Yes Historical Provider, MD  metoprolol (LOPRESSOR) 100 MG tablet Take 0.5 tablets (50 mg total) by mouth 2 (two) times daily. 12/02/12  Yes Abelino Derrick, PA-C    Physical Exam: Filed Vitals:   01/11/13 0100 01/11/13 0115 01/11/13 0230 01/11/13 0300  BP: 111/90 119/88 124/87 124/87  Pulse: 91 108 109 112  Temp:      TempSrc:      Resp:   32 29  Weight:      SpO2: 93% 97% 98% 96%    General: Alert, Awake and Oriented to Time, Place and Person. Appear in moderate distress Eyes: PERRL ENT: Oral Mucosa clear moist. Neck: Minimal  JVD, no  Carotid Bruits  Cardiovascular: S1 and S2 Present, aortic systolic  Murmur, Peripheral Pulses Present Respiratory: Bilateral Air entry equal and Decreased, Clear to Auscultation,  Faint  Crackles,no  wheezes Abdomen: Bowel Sound Present, Soft and Non tender Skin: No  Rash Extremities: Bilateral  Pedal edema, no  calf tenderness Neurologic: Grossly Unremarkable.  Labs on Admission:  CBC:  Recent Labs Lab 01/10/13 2204  WBC 7.7  HGB 14.4  HCT 42.4  MCV 88.3  PLT 209    CMP     Component Value Date/Time   NA 143 01/10/2013 2204   K 4.9 01/10/2013 2204   CL 107 01/10/2013 2204   CO2 26 01/10/2013 2204   GLUCOSE 89 01/10/2013 2204   BUN 32* 01/10/2013 2204   CREATININE 1.93* 01/10/2013 2204   CREATININE 1.65* 10/29/2012 1646   CALCIUM 9.0 01/10/2013 2204   PROT 6.3 12/02/2012 0518   ALBUMIN 3.1* 12/02/2012 0518   AST 22 12/02/2012 0518   ALT 26 12/02/2012 0518   ALKPHOS 84 12/02/2012 0518   BILITOT 1.5* 12/02/2012 0518   GFRNONAA 33* 01/10/2013 2204   GFRAA 38* 01/10/2013 2204    No results found for this basename: LIPASE, AMYLASE,  in the last 168 hours No results found for this basename:  AMMONIA,  in the last 168 hours  Cardiac Enzymes: No results found for this basename: CKTOTAL, CKMB, CKMBINDEX, TROPONINI,  in the last 168 hours  BNP (last 3 results)  Recent Labs  11/28/12 2147 11/30/12 0455 01/10/13 2204  PROBNP 33948.0* 95621.3* 27226.0*    Radiological Exams on Admission: Dg Chest 2 View  01/10/2013   CLINICAL DATA:  Shortness of breath  EXAM: CHEST  2 VIEW  COMPARISON:  11/28/2012  FINDINGS: No adverse features related to biventricular ICD/ pacer via  left approach. No change in cardiopericardial enlargement.  Small bilateral pleural effusions or pleural scarring. No overt edema. Chronic lower lung opacities, particularly retrocardiac, were there is likely scarring. Right nipple shadow again noted.  IMPRESSION: 1. Cardiomegaly without edema. 2. Chronic small bilateral pleural effusions or pleural scarring. 3. Chronic lower lung scarring and atelectasis.   Electronically Signed   By: Tiburcio Pea   On: 01/10/2013 23:06    EKG: Independently reviewed. Paced rhythm.  Assessment/Plan Principal Problem:   Chest pain Active Problems:   Nonischemic cardiomyopathy,  EF 35-40% by echo 07/2012.     VENTRICULAR TACHYCARDIA- on Amiodarone   Atrial fibrillation   CKD (chronic kidney disease) stage 3, GFR 30-59 ml/min   Acute on chronic systolic heart failure   1. Chest pain The patient presents with intermittent chest pain. His initial EKG appears to be a paced rhythm without any ST-T wave changes. On rhythm monitoring he appears to have on and off PVCs which could be aberrant conduction of his A. fib as well as paced rhythm.  Currently he has negative troponins and no significant electrolyte imbalances. Patient has responded to IV Lasix and he feels significantly better. I will continue to monitor serial troponins. I will consult soft [facility] cardiology service for further management. I would continue his beta blockers and antiarrhythmics. Patient is already  on therapeutic anticoagulation with apixaban.  2. A. fib versus V. Tach Continue Amy and her, beta blocker, digitalis. Monitor her in the step down unit at present . Monitor electrolytes Continue apixaban  3. Chronic kidney disease stage III Monitor serum BMP avoid nephrotoxic medications  4. nonischemic cardiomyopathy   patient has responded to one dose of IV Lasix continue monitoring him and add an additional dose of Lasix in the morning.  DVT Prophylaxis: mechanical compression device Nutrition: Cardiac and renal diet   Code Status: DO NOT RESUSCITATE as per the discussion with the patient   Disposition: Admitted to observation in step-down unit.  Author: Lynden Oxford, MD [facility] Pager: [phone] 01/11/2013, 3:45 AM    If 7PM-7AM, please contact night-coverage [email or web address] Password [email or web address]

## 2013-01-11 NOTE — Progress Notes (Signed)
ANTICOAGULATION CONSULT NOTE - Initial Consult  Pharmacy Consult for eliquis Indication: atrial fibrillation  Allergies  Allergen Reactions  . Penicillins     REACTION: Rash    Patient Measurements: Weight: 172 lb (78.019 kg)   Vital Signs: Temp: 97.9 F (36.6 C) (09/22 2201) Temp src: Oral (09/22 2201) BP: 124/87 mmHg (09/23 0300) Pulse Rate: 112 (09/23 0300)  Labs:  Recent Labs  01/10/13 2204  HGB 14.4  HCT 42.4  PLT 209  CREATININE 1.93*    The CrCl is unknown because both a height and weight (above a minimum accepted value) are required for this calculation.   Medical History: Past Medical History  Diagnosis Date  . Hypertension   . Chronic tension headaches   . Tinea cruris   . Tinea unguium   . Hernia, hiatal   . CVA (cerebral infarction)   . Systolic heart failure     EF 16-10% in 08/2012 (Echo at [facility] [place])  . Bradycardia   . Ventricular tachycardia   . Syncope   . CAD (coronary artery disease)     h/o MI in 2001, admitted for possible STEMI at [facility] in 08/2012 - Cardiac cath at [facility] in 08/2012: Nonobstructive coronary artery disease  . Sleep disorder   . Atrial fibrillation     On Eliquis, new onset in 08/2012 - successful cardioversion to sinus rhythm  . Automatic implantable cardioverter-defibrillator in situ   . Pacemaker     ICD/PACEMAKER   . Arthritis     HANDS  . CHF (congestive heart failure)   . Myocardial infarction   . Anginal pain   . Depression   . Shortness of breath   . Pneumonia     Medications:   (Not in a hospital admission)  Assessment: 72 yo man admitted with CP to continue eliquis for afib.  His CrCl ~35 ml/min.  Home dose of 5 mg bid is appropriate for age, weight and renal function. Goal of Therapy:  Monitor platelets by anticoagulation protocol: Yes   Plan:  Cont eliquis 5 mg po bid. Monitor for s&s bleeding.  Talbert Cage Poteet 01/11/2013,3:52 AM

## 2013-01-11 NOTE — Progress Notes (Signed)
Utilization Review Completed.Nathan Mckinney T9/23/2014  

## 2013-01-12 ENCOUNTER — Encounter (HOSPITAL_COMMUNITY): Payer: Self-pay | Admitting: Certified Registered"

## 2013-01-12 ENCOUNTER — Inpatient Hospital Stay (HOSPITAL_COMMUNITY): Payer: Medicare Other | Admitting: Certified Registered"

## 2013-01-12 DIAGNOSIS — R0609 Other forms of dyspnea: Secondary | ICD-10-CM

## 2013-01-12 DIAGNOSIS — R079 Chest pain, unspecified: Secondary | ICD-10-CM

## 2013-01-12 MED ORDER — SODIUM CHLORIDE 0.9 % IV SOLN: 250.0000 mL | INTRAVENOUS | Status: DC

## 2013-01-12 MED ORDER — LIDOCAINE HCL (CARDIAC) 20 MG/ML IV SOLN
INTRAVENOUS | Status: DC | PRN
  Administered 2013-01-12: 100 mg via INTRAVENOUS

## 2013-01-12 MED ORDER — ETOMIDATE 2 MG/ML IV SOLN
INTRAVENOUS | Status: DC | PRN
  Administered 2013-01-12: 10 mg via INTRAVENOUS

## 2013-01-12 MED ORDER — SODIUM CHLORIDE 0.9 % IJ SOLN: 3.0000 mL | INTRAMUSCULAR | Status: DC | PRN

## 2013-01-12 MED ORDER — HYDROCORTISONE 1 % EX CREA
1.0000 "application " | TOPICAL_CREAM | Freq: Three times a day (TID) | CUTANEOUS | Status: DC | PRN
  Filled 2013-01-12: qty 28

## 2013-01-12 MED ORDER — SODIUM CHLORIDE 0.9 % IJ SOLN
3.0000 mL | Freq: Two times a day (BID) | INTRAMUSCULAR | Status: DC
  Administered 2013-01-12 – 2013-01-14 (×5): 3 mL via INTRAVENOUS
  Administered 2013-01-15: 09:00:00 via INTRAVENOUS
  Administered 2013-01-16 – 2013-01-20 (×4): 3 mL via INTRAVENOUS

## 2013-01-12 MED ORDER — SODIUM CHLORIDE 0.9 % IV SOLN
INTRAVENOUS | Status: DC | PRN
  Administered 2013-01-12: 10:00:00 via INTRAVENOUS

## 2013-01-12 NOTE — Anesthesia Procedure Notes (Signed)
Procedure Name: MAC Date/Time: 01/12/2013 10:10 AM Performed by: Ellin Goodie Pre-anesthesia Checklist: Patient identified, Emergency Drugs available, Suction available, Patient being monitored and Timeout performed Patient Re-evaluated:Patient Re-evaluated prior to inductionOxygen Delivery Method: Ambu bag Preoxygenation: Pre-oxygenation with 100% oxygen Intubation Type: IV induction Ventilation: Mask ventilation without difficulty Dental Injury: Teeth and Oropharynx as per pre-operative assessment  Comments: Pt induced with etomidate by Dr. Krista Blue, patent airway throughout cardioversion.  O2 sats 96% on 4 L  on departure.  Carlynn Herald, CRNA

## 2013-01-12 NOTE — Anesthesia Preprocedure Evaluation (Addendum)
Anesthesia Evaluation  Patient identified by MRN, date of birth, ID band Patient awake    Reviewed: Allergy & Precautions, H&P , NPO status , Patient's Chart, lab work & pertinent test results, reviewed documented beta blocker date and time   Airway Mallampati: I TM Distance: >3 FB     Dental  (+) Teeth Intact and Dental Advisory Given   Pulmonary shortness of breath, pneumonia -, resolved,  + rhonchi         Cardiovascular hypertension, Pt. on medications + angina with exertion + CAD, + Past MI, + Peripheral Vascular Disease and +CHF + dysrhythmias Atrial Fibrillation + pacemaker + Cardiac Defibrillator + Valvular Problems/Murmurs Rhythm:Irregular     Neuro/Psych  Headaches, Anxiety Depression  Neuromuscular disease CVA    GI/Hepatic hiatal hernia,   Endo/Other    Renal/GU ARFRenal disease     Musculoskeletal   Abdominal (+)  Abdomen: soft. Bowel sounds: normal.  Peds  Hematology   Anesthesia Other Findings Called to bedside by Dr. Herbie Baltimore for cardioversion, EF 20-25%, Afib, ICD, CHF, h/o vtach, CAD, Carlynn Herald, CRNA  Reproductive/Obstetrics                       Anesthesia Physical Anesthesia Plan  ASA: III  Anesthesia Plan: General   Post-op Pain Management:    Induction: Intravenous  Airway Management Planned: Mask  Additional Equipment:   Intra-op Plan:   Post-operative Plan:   Informed Consent:   Dental advisory given  Plan Discussed with: CRNA, Anesthesiologist and Surgeon  Anesthesia Plan Comments:         Anesthesia Quick Evaluation

## 2013-01-12 NOTE — H&P (View-Only) (Signed)
Subjective:  Resting comfortably. No major complaints. Still tired. breathing improved (lying flat on side)  Objective:  Vital Signs in the last 24 hours: Temp:  [97.3 F (36.3 C)-97.8 F (36.6 C)] 97.7 F (36.5 C) (09/24 0731) Pulse Rate:  [69-116] 87 (09/24 0800) Resp:  [10-33] 24 (09/24 0800) BP: (90-127)/(50-99) 98/70 mmHg (09/24 0800) SpO2:  [86 %-99 %] 98 % (09/24 0800) Weight:  [168 lb 3.4 oz (76.3 kg)] 168 lb 3.4 oz (76.3 kg) (09/24 0500)  Intake/Output from previous day: 09/23 0701 - 09/24 0700 In: -  Out: 1150 [Urine:1150] Intake/Output from this shift:    Physical Exam: General appearance: alert, cooperative, no distress and thin, chronically ill appearing. A&O x 3 Neck: JVD - a few cm above sternal notch, no adenopathy and no carotid bruit Lungs: rales bibasilar and LLL and non-labored Heart: irregularly irregular rhythm and 2-3/6 HSM RUSB Abdomen: soft, non-tender; bowel sounds normal; no masses,  no organomegaly Extremities: extremities normal, atraumatic, no cyanosis or edema Pulses: 2+ and symmetric  Lab Results:  Recent Labs  01/10/13 2204 01/11/13 0420  WBC 7.7 7.8  HGB 14.4 14.4  PLT 209 191    Recent Labs  01/10/13 2204 01/11/13 0420  NA 143 144  K 4.9 4.5  CL 107 109  CO2 26 24  GLUCOSE 89 91  BUN 32* 32*  CREATININE 1.93* 1.85*    Recent Labs  01/11/13 1158 01/11/13 1534  TROPONINI <0.30 <0.30   Hepatic Function Panel  Recent Labs  01/11/13 0420  PROT 6.3  ALBUMIN 3.6  AST 27  ALT 25  ALKPHOS 85  BILITOT 1.7*   No results found for this basename: CHOL,  in the last 72 hours No results found for this basename: PROTIME,  in the last 72 hours  Imaging: Imaging results have been reviewed  Cardiac Studies: Echo - EF 20-25%, diffuse HK, inferolateral AK with inferolateral aneurysmal dilation & septal severe HK. MV - severe MR, severe LA dilation, RV dilated with mod-severe RA dilation. PAP ~55 mmHG.  Assessment/Plan:   Principal Problem:   Chest pain Active Problems:   Nonischemic cardiomyopathy,  EF 35-40% by echo 07/2012.  -- now 20-25% 12/2012   VENTRICULAR TACHYCARDIA- on Amiodarone   Atrial fibrillation   CKD (chronic kidney disease) stage 3, GFR 30-59 ml/min   Acute on chronic systolic heart failure  First order of business today was scheduling DCCV - Anesthesia is able to come within nex 15 min.    Plan DCCV attempt today.  Pending results, will ask Dr. Tillie Rung to reassess.  If unsuccessful - ? MAZE?MVR etc. As per Dr. Hart Carwin note.  Continue diuresis; if no significant improvement in Sx - will consider CHF Svc consult.  On Amiodarone, Digoxin & BP seems to be tolerating low dose ACE-I with Lopressor (would like to convert lopressor to Toprol vs. Carvedilol) - but can be done once the acute issues are resolved.    LOS: 2 days    [name],[name] 01/12/2013, 10:30 AM

## 2013-01-12 NOTE — Anesthesia Postprocedure Evaluation (Signed)
  Anesthesia Post-op Note  Patient: Nathan Mckinney  Procedure(s) Performed: * No procedures listed *  Patient Location: Nursing Unit  Anesthesia Type:General  Level of Consciousness: sedated  Airway and Oxygen Therapy: Patient Spontanous Breathing  Post-op Pain: none  Post-op Assessment: Patient's Cardiovascular Status Stable  Post-op Vital Signs: stable  Complications: No apparent anesthesia complications

## 2013-01-12 NOTE — Preoperative (Signed)
Beta Blockers   Reason not to administer Beta Blockers:Not Applicable 

## 2013-01-12 NOTE — Transfer of Care (Signed)
Immediate Anesthesia Transfer of Care Note  Patient: Nathan Mckinney  Procedure(s) Performed: * No procedures listed *  Patient Location: Nursing Unit  Anesthesia Type:General  Level of Consciousness: sedated  Airway & Oxygen Therapy: Patient Spontanous Breathing  Post-op Assessment: Report given to PACU RN  Post vital signs: stable  Complications: No apparent anesthesia complications

## 2013-01-12 NOTE — Progress Notes (Addendum)
Subjective:  Resting comfortably. No major complaints. Still tired. breathing improved (lying flat on side)  Objective:  Vital Signs in the last 24 hours: Temp:  [97.3 F (36.3 C)-97.8 F (36.6 C)] 97.7 F (36.5 C) (09/24 0731) Pulse Rate:  [69-116] 87 (09/24 0800) Resp:  [10-33] 24 (09/24 0800) BP: (90-127)/(50-99) 98/70 mmHg (09/24 0800) SpO2:  [86 %-99 %] 98 % (09/24 0800) Weight:  [168 lb 3.4 oz (76.3 kg)] 168 lb 3.4 oz (76.3 kg) (09/24 0500)  Intake/Output from previous day: 09/23 0701 - 09/24 0700 In: -  Out: 1150 [Urine:1150] Intake/Output from this shift:    Physical Exam: General appearance: alert, cooperative, no distress and thin, chronically ill appearing. A&O x 3 Neck: JVD - a few cm above sternal notch, no adenopathy and no carotid bruit Lungs: rales bibasilar and LLL and non-labored Heart: irregularly irregular rhythm and 2-3/6 HSM RUSB Abdomen: soft, non-tender; bowel sounds normal; no masses,  no organomegaly Extremities: extremities normal, atraumatic, no cyanosis or edema Pulses: 2+ and symmetric  Lab Results:  Recent Labs  01/10/13 2204 01/11/13 0420  WBC 7.7 7.8  HGB 14.4 14.4  PLT 209 191    Recent Labs  01/10/13 2204 01/11/13 0420  NA 143 144  K 4.9 4.5  CL 107 109  CO2 26 24  GLUCOSE 89 91  BUN 32* 32*  CREATININE 1.93* 1.85*    Recent Labs  01/11/13 1158 01/11/13 1534  TROPONINI <0.30 <0.30   Hepatic Function Panel  Recent Labs  01/11/13 0420  PROT 6.3  ALBUMIN 3.6  AST 27  ALT 25  ALKPHOS 85  BILITOT 1.7*   No results found for this basename: CHOL,  in the last 72 hours No results found for this basename: PROTIME,  in the last 72 hours  Imaging: Imaging results have been reviewed  Cardiac Studies: Echo - EF 20-25%, diffuse HK, inferolateral AK with inferolateral aneurysmal dilation & septal severe HK. MV - severe MR, severe LA dilation, RV dilated with mod-severe RA dilation. PAP ~55 mmHG.  Assessment/Plan:   Principal Problem:   Chest pain Active Problems:   Nonischemic cardiomyopathy,  EF 35-40% by echo 07/2012.  -- now 20-25% 12/2012   VENTRICULAR TACHYCARDIA- on Amiodarone   Atrial fibrillation   CKD (chronic kidney disease) stage 3, GFR 30-59 ml/min   Acute on chronic systolic heart failure  First order of business today was scheduling DCCV - Anesthesia is able to come within nex 15 min.    Plan DCCV attempt today.  Pending results, will ask Dr. Alred to reassess.  If unsuccessful - ? MAZE?MVR etc. As per Dr. Alred's note.  Continue diuresis; if no significant improvement in Sx - will consider CHF Svc consult.  On Amiodarone, Digoxin & BP seems to be tolerating low dose ACE-I with Lopressor (would like to convert lopressor to Toprol vs. Carvedilol) - but can be done once the acute issues are resolved.    LOS: 2 days    Burlon Centrella W 01/12/2013, 10:30 AM     

## 2013-01-12 NOTE — Procedures (Signed)
Electrical Cardioversion Procedure Note Nathan Mckinney [id number] Jan 13, 1941  Procedure: Electrical Cardioversion Indications:  Atrial Fibrillation; WITH Acute on Chronic combined Systolic & Diastolic Heart Failure.  Procedure Details Consent: Risks of procedure as well as the alternatives and risks of each were explained to the (patient/caregiver).  Consent for procedure obtained.    Time Out: Verified patient identification, verified procedure, site/side was marked, verified correct patient position, special equipment/implants available, medications/allergies/relevent history reviewed, required imaging and test results available.  Performed  Patient placed on cardiac monitor, pulse oximetry, supplemental oxygen as necessary.  Sedation given: 10mg  Etomidate + Lidocaine 100mg  Pacer pads placed anterior and posterior chest. Appropriately placed to avoid ICD disturbance.  Cardioverted 1 time(s).  Cardioverted at 200J.  Evaluation Findings: Post procedure EKG shows: NSR with V Pacing Complications: None Patient did tolerate procedure well.   Nathan Mckinney 01/12/2013, 10:34 AM

## 2013-01-12 NOTE — Interval H&P Note (Signed)
History and Physical Interval Note:  01/12/2013 10:32 AM  Nathan Mckinney  has presented today for surgery, with the diagnosis of Atrial Fibrillation with Acute on Chronic combined Systolic & Diastolic HF + Severe MR.    The various methods of treatment have been discussed with the patient and family. After consideration of risks, benefits and other options for treatment, the patient has consented to  SYNCHRONIZED DIRECT CURRENT CARDIOVERSION as a surgical intervention .    The patient's history has been reviewed, patient examined, no change in status, stable for surgery.  I have reviewed the patient's chart and labs.  Questions were answered to the patient's satisfaction.     [name]

## 2013-01-12 NOTE — Interval H&P Note (Signed)
History and Physical Interval Note:  01/12/2013 10:32 AM  Baird Kay  has presented today for surgery, with the diagnosis of * No surgery found *  The various methods of treatment have been discussed with the patient and family. After consideration of risks, benefits and other options for treatment, the patient has consented to  * No surgery found * as a surgical intervention .  The patient's history has been reviewed, patient examined, no change in status, stable for surgery.  I have reviewed the patient's chart and labs.  Questions were answered to the patient's satisfaction.     [name],[name]

## 2013-01-13 LAB — BASIC METABOLIC PANEL
CO2: 25 mEq/L (ref 19–32)
Chloride: 104 mEq/L (ref 96–112)
Creatinine, Ser: 2.13 mg/dL — ABNORMAL HIGH (ref 0.50–1.35)
GFR calc Af Amer: 33 mL/min — ABNORMAL LOW (ref >90)
GFR calc non Af Amer: 29 mL/min — ABNORMAL LOW (ref >90)
Potassium: 5 mEq/L (ref 3.5–5.1)

## 2013-01-13 MED ORDER — FUROSEMIDE 10 MG/ML IJ SOLN
40.0000 mg | Freq: Two times a day (BID) | INTRAMUSCULAR | Status: AC
  Administered 2013-01-13 (×2): 40 mg via INTRAVENOUS
  Filled 2013-01-13 (×3): qty 4

## 2013-01-13 NOTE — Progress Notes (Signed)
Subjective: Had a pain last night in the right side on chest.  Resolved.  Objective: Vital signs in last 24 hours: Temp:  [97.4 F (36.3 C)-98.1 F (36.7 C)] 98.1 F (36.7 C) (09/25 0400) Pulse Rate:  [41-141] 70 (09/24 1700) Resp:  [8-31] 26 (09/24 1700) BP: (90-122)/(57-88) 111/58 mmHg (09/24 1700) SpO2:  [86 %-99 %] 98 % (09/25 0400) Weight:  [167 lb 8.8 oz (76 kg)] 167 lb 8.8 oz (76 kg) (09/25 0500) Last BM Date: 01/12/13  Intake/Output from previous day: 09/24 0701 - 09/25 0700 In: 660 [P.O.:600; I.V.:60] Out: 800 [Urine:800] Intake/Output this shift:    Medications Current Facility-Administered Medications  Medication Dose Route Frequency Provider Last Rate Last Dose  . 0.9 %  sodium chloride infusion  250 mL Intravenous Continuous Marykay Lex, MD      . acetaminophen (TYLENOL) tablet 650 mg  650 mg Oral Q4H PRN Lynden Oxford, MD      . allopurinol (ZYLOPRIM) tablet 100 mg  100 mg Oral QPM Lynden Oxford, MD   100 mg at 01/12/13 1750  . amiodarone (PACERONE) tablet 200 mg  200 mg Oral BID Lynden Oxford, MD   200 mg at 01/12/13 2138  . apixaban (ELIQUIS) tablet 5 mg  5 mg Oral BID April K Palumbo-Rasch, MD   5 mg at 01/12/13 2138  . atorvastatin (LIPITOR) tablet 20 mg  20 mg Oral Daily Lynden Oxford, MD   20 mg at 01/12/13 1253  . digoxin (LANOXIN) tablet 0.125 mg  0.125 mg Oral QPM Lynden Oxford, MD   0.125 mg at 01/12/13 1750  . donepezil (ARICEPT) tablet 10 mg  10 mg Oral Daily Lynden Oxford, MD   10 mg at 01/12/13 1253  . hydrocortisone cream 1 % 1 application  1 application Topical TID PRN Marykay Lex, MD      . lisinopril (PRINIVIL,ZESTRIL) tablet 2.5 mg  2.5 mg Oral Daily Lynden Oxford, MD   2.5 mg at 01/12/13 1253  . metoprolol tartrate (LOPRESSOR) tablet 50 mg  50 mg Oral BID Lynden Oxford, MD   50 mg at 01/12/13 2138  . ondansetron (ZOFRAN) injection 4 mg  4 mg Intravenous Q6H PRN Lynden Oxford, MD   4 mg at 01/12/13 2137  . sodium chloride 0.9 % injection 3  mL  3 mL Intravenous Q12H Marykay Lex, MD   3 mL at 01/12/13 2138  . sodium chloride 0.9 % injection 3 mL  3 mL Intravenous PRN Marykay Lex, MD        PE: General appearance: Patient resting comfortably and lying flat. alert, cooperative and no distress Lungs: Crackles at right base.  Heart: regular rate and rhythm and 1/6 sys MM Extremities: No LEE Pulses: 2+ and symmetric Skin: Warm and dry Neurologic: Grossly normal  Lab Results:   Recent Labs  01/10/13 2204 01/11/13 0420  WBC 7.7 7.8  HGB 14.4 14.4  HCT 42.4 42.4  PLT 209 191   BMET  Recent Labs  01/10/13 2204 01/11/13 0420  NA 143 144  K 4.9 4.5  CL 107 109  CO2 26 24  GLUCOSE 89 91  BUN 32* 32*  CREATININE 1.93* 1.85*  CALCIUM 9.0 8.9    Assessment/Plan  Principal Problem:   Chest pain Active Problems:   Nonischemic cardiomyopathy,  EF 35-40% by echo 07/2012.  -- now 20-25% 12/2012   VENTRICULAR TACHYCARDIA- on Amiodarone   Atrial fibrillation   CKD (chronic kidney disease) stage 3, GFR  30-59 ml/min   Acute on chronic systolic heart failure  Plan:  SP DCCV. A-sensing and V-pacing on telemetry.  Net fluids: -124ml/-2.0L.  BMET pending for this morning.  Get up and ambulated this morning.  Not on any diuretics at this time.  EF15-20%.  Home lasix 40mg  BID.     LOS: 3 days    HAGER, BRYAN 01/13/2013 7:47 AM  I have seen and evaluated the patient this AM along with Corine Shelter, PA; Nada Boozer, NP, Boyce Medici, PA, or [place], [place]. I agree with his findings, examination as well as impression recommendations.  Definitely feels better today after DCCV -- maintaining SR, minimal diuresis yesterday without lasix;   NICM with Aon C combined HFLung exam notable for rales R>L base --> will need more lasix; ordered BID 40mg  IV (total of 3 doses to allow for reevaluation in AM), should have ~8lb to go to dry wgt. (need to be mindful of renal insufficiency)  EP is evaluating his device -- will  need to see if A lead is functioning.  With severe MR history -- ? The benefit of re-assessing EF & MR now that we have SR restored; ? Benefit of MVR +/- MAZE  MD Time with pt: 15 min  Rawlins Stuard W, M.D., M.S. [facility] [address] [place], [place]  [address]  [phone] Pager # [phone] 01/13/2013 10:33 AM

## 2013-01-13 NOTE — Progress Notes (Signed)
Pt admitted to 4E 01 from ICU per w/c. Alert/oriented. VS done placed on telemetry monitor.

## 2013-01-13 NOTE — Progress Notes (Signed)
Report called to receiving RN [id number]. Pt with no complaints at the current time. Will transfer via WC.

## 2013-01-14 ENCOUNTER — Inpatient Hospital Stay (HOSPITAL_COMMUNITY): Payer: Medicare Other

## 2013-01-14 ENCOUNTER — Encounter (HOSPITAL_COMMUNITY): Payer: Self-pay | Admitting: Thoracic Surgery (Cardiothoracic Vascular Surgery)

## 2013-01-14 DIAGNOSIS — I5042 Chronic combined systolic (congestive) and diastolic (congestive) heart failure: Secondary | ICD-10-CM | POA: Diagnosis present

## 2013-01-14 DIAGNOSIS — I059 Rheumatic mitral valve disease, unspecified: Secondary | ICD-10-CM

## 2013-01-14 DIAGNOSIS — I071 Rheumatic tricuspid insufficiency: Secondary | ICD-10-CM | POA: Diagnosis present

## 2013-01-14 DIAGNOSIS — I509 Heart failure, unspecified: Secondary | ICD-10-CM

## 2013-01-14 DIAGNOSIS — I340 Nonrheumatic mitral (valve) insufficiency: Secondary | ICD-10-CM | POA: Diagnosis present

## 2013-01-14 DIAGNOSIS — Z7901 Long term (current) use of anticoagulants: Secondary | ICD-10-CM

## 2013-01-14 DIAGNOSIS — I079 Rheumatic tricuspid valve disease, unspecified: Secondary | ICD-10-CM

## 2013-01-14 LAB — CARBOXYHEMOGLOBIN
Carboxyhemoglobin: 1.7 % — ABNORMAL HIGH (ref 0.5–1.5)
Methemoglobin: 1.4 % (ref 0.0–1.5)
O2 Saturation: 56.2 %
Total hemoglobin: 14.3 g/dL (ref 13.5–18.0)

## 2013-01-14 MED ORDER — FUROSEMIDE 10 MG/ML IJ SOLN
80.0000 mg | Freq: Two times a day (BID) | INTRAMUSCULAR | Status: DC
  Filled 2013-01-14 (×2): qty 8

## 2013-01-14 MED ORDER — SODIUM CHLORIDE 0.9 % IV SOLN
INTRAVENOUS | Status: DC | PRN
  Administered 2013-01-18 – 2013-01-19 (×2): via INTRAVENOUS

## 2013-01-14 MED ORDER — FENTANYL CITRATE 0.05 MG/ML IJ SOLN
INTRAMUSCULAR | Status: AC
  Administered 2013-01-14: 25 ug
  Filled 2013-01-14: qty 2

## 2013-01-14 MED ORDER — MILRINONE IN DEXTROSE 20 MG/100ML IV SOLN
0.2500 ug/kg/min | INTRAVENOUS | Status: DC
  Administered 2013-01-14 – 2013-01-20 (×9): 0.25 ug/kg/min via INTRAVENOUS
  Filled 2013-01-14 (×10): qty 100

## 2013-01-14 MED ORDER — MIDAZOLAM HCL 2 MG/2ML IJ SOLN
INTRAMUSCULAR | Status: AC
  Administered 2013-01-14: 1 mg
  Filled 2013-01-14: qty 2

## 2013-01-14 MED ORDER — HEPARIN (PORCINE) IN NACL 100-0.45 UNIT/ML-% IJ SOLN
14.0000 [IU]/kg/h | INTRAMUSCULAR | Status: DC
  Administered 2013-01-14: 14 [IU]/kg/h via INTRAVENOUS
  Filled 2013-01-14 (×3): qty 250

## 2013-01-14 NOTE — Procedures (Signed)
Central Venous Catheter Insertion Procedure Note Nathan Mckinney [id number] 02-20-1941  Procedure: Insertion of Pulmonary Artery Catheter Indications: Heart failure  Procedure Details Consent: Risks of procedure as well as the alternatives and risks of each were explained to the (patient/caregiver).  Consent for procedure obtained. Time Out: Verified patient identification, verified procedure, site/side was marked, verified correct patient position, special equipment/implants available, medications/allergies/relevent history reviewed, required imaging and test results available.  Performed  Maximum sterile technique was used including antiseptics, cap, gloves, gown, hand hygiene, mask and sheet. Skin prep: Chlorhexidine; local anesthetic administered An 8FR venous sheath was placed in the right internal jugular vein using the modified Seldinger technique. A pulmonary artery catheter was then placed in the R pulmonary artery under fluoroscopic guidance. Right heart pressures were measured.   Evaluation Blood flow good Complications: No apparent complications Patient did tolerate procedure well. Chest X-ray ordered to verify placement.  CXR: normal.  Nathan Mckinney 01/14/2013, 7:59 PM

## 2013-01-14 NOTE — Consult Note (Signed)
PHARMACY CONSULT NOTE  Pharmacy Consult:  Heparin Indication: Transition from Eliquis [Apixaban] to Heparin infusion for atrial fibrillation   Allergies: Allergies  Allergen Reactions  . Penicillins     REACTION: Rash    Height/Weight: Height: 5\' 11"  (180.3 cm) Weight: 166 lb 6.4 oz (75.479 kg) (a scale) IBW/kg (Calculated) : 75.3 Dosing weight 75.5 kg  Vital Signs: BP 108/72  Pulse 70  Temp(Src) 97.2 F (36.2 C) (Oral)  Resp 20  Ht 5\' 11"  (1.803 m)  Wt 166 lb 6.4 oz (75.479 kg)  BMI 23.22 kg/m2  SpO2 91%  Active Problems: Principal Problem:   Acute on chronic systolic heart failure- (doesn't tolerate AF) Active Problems:   CAD- 40% RCA, 50% LAD 2004   Nonischemic cardiomyopathy,  EF 35-40% by echo 07/2012.  -- now 20-25% 12/2012   VENTRICULAR TACHYCARDIA- on Amiodarone   Atrial fibrillation, persistent, recurrent   CVA 2004   SLEEP DISORDER   ICD '04, upgraded to BiV ICD 2/10 and 11/13- ? 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  Medical / Surgical History: Past Medical History  Diagnosis Date  . Hypertension   . Chronic tension headaches   . Tinea cruris   . Tinea unguium   . Hernia, hiatal   . CVA (cerebral infarction)   . Systolic heart failure     EF 75-64% in 08/2012 (Echo at [facility], [place])  . Bradycardia   . Ventricular tachycardia   . Syncope   . CAD (coronary artery disease)     h/o MI in 2001, admitted for possible STEMI at [facility] in 08/2012 - Cardiac cath at [facility] in 08/2012: Nonobstructive coronary artery disease  . Sleep disorder   . Atrial fibrillation     On Eliquis, new onset in 08/2012 - successful cardioversion to sinus rhythm  . Automatic implantable cardioverter-defibrillator in situ   . Pacemaker     ICD/PACEMAKER   . Arthritis     HANDS  . CHF (congestive heart failure)   . Anginal pain   . Depression   . Shortness of breath   . Pneumonia   . Heart murmur   . Stroke   . Mitral regurgitation   . Acute on chronic systolic heart failure-  10/22/2012  . Chronic anticoagulation- Eloquis   . CKD (chronic kidney disease) stage 3, GFR 30-59 ml/min   . 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 Atrial fibrillation, persistent, recurrent   . Chronic combined systolic and diastolic congestive heart failure   . Non-ischemic cardiomyopathy 1997  . Tricuspid regurgitation    Past Surgical History  Procedure Laterality Date  . Vasectomy    . Icd implantation  06/12/08    [employer] biventricular ICD, remote - no, Dr. Ladona Ridgel  . Cardiac catheterization  02/03/03    Dr. Jenne Campus   . Insert / replace / remove pacemaker      pacer/ICD    Medications:  Scheduled:  .  allopurinol  100 mg Oral QPM  . amiodarone  200 mg Oral BID  . atorvastatin  20 mg Oral Daily  . digoxin  0.125 mg Oral QPM  . donepezil  10 mg Oral Daily  . furosemide  40 mg Intravenous BID  . lisinopril  2.5 mg Oral Daily  . metoprolol  50 mg Oral BID  . sodium chloride  3 mL Intravenous Q12H    Assessment:  72 y.o.male will be transitioned from chronic Apixaban dosing to Heparin infusion during evaluation of patient for possible MVR +/- MAZE procedure.   Patient currently on  Apixaban 5 mg q 12 hours.  Last dose 10:12 am today.  Apixaban has been discontinued   Goal of Therapy:   Heparin level 0.3-0.7 units/ml      Plan:   Begin Heparin infusion at 2200 pm tonight, No Bolus, at 1050 units/hr.  [starting 12 hours  after last Apixaban dose]. Daily Heparin Levels, CBC.  Monitor for bleeding complications  First Heparin level with AM labs 01/15/13.  Savannha Welle, Elisha Headland,  Pharm.D.. 01/14/2013,  5:17 PM

## 2013-01-14 NOTE — Progress Notes (Addendum)
SUBJECTIVE: The patient is doing well today.  At this time, he denies chest pain, shortness of breath, or any new concerns.  He feels that his breathing is improved s/p DCCV.    CURRENT MEDICATIONS: . allopurinol  100 mg Oral QPM  . amiodarone  200 mg Oral BID  . apixaban  5 mg Oral BID  . atorvastatin  20 mg Oral Daily  . digoxin  0.125 mg Oral QPM  . donepezil  10 mg Oral Daily  . furosemide  40 mg Intravenous BID  . lisinopril  2.5 mg Oral Daily  . metoprolol  50 mg Oral BID  . sodium chloride  3 mL Intravenous Q12H   . sodium chloride      OBJECTIVE: Physical Exam: Filed Vitals:   01/13/13 1635 01/13/13 1952 01/13/13 2004 01/14/13 0505  BP: 108/72 100/74  111/74  Pulse:  70  68  Temp: 98.4 F (36.9 C) 97.2 F (36.2 C)  98.5 F (36.9 C)  TempSrc: Oral Oral  Oral  Resp:  21  20  Height:  5\' 11"  (1.803 m) 5\' 11"  (1.803 m)   Weight:   168 lb 14.4 oz (76.613 kg) 166 lb 6.4 oz (75.479 kg)  SpO2: 96% 98%  94%    Intake/Output Summary (Last 24 hours) at 01/14/13 4098 Last data filed at 01/14/13 1191  Gross per 24 hour  Intake    600 ml  Output   1550 ml  Net   -950 ml    Telemetry reveals ventricular pacing with atrial non capture and retrograde P waves  GEN- The patient is well appearing, sleeping but rousable Head- normocephalic, atraumatic Eyes-  Sclera clear, conjunctiva pink Ears- hearing intact Oropharynx- clear Neck- supple, + JVD Lungs- Clear to ausculation bilaterally, normal work of breathing Heart- Regular rate and rhythm (paced) GI- soft, NT, ND, + BS Extremities- no clubbing, cyanosis, or edema  LABS: Basic Metabolic Panel:  Recent Labs  47/82/95 0957  NA 139  K 5.0  CL 104  CO2 25  GLUCOSE 101*  BUN 38*  CREATININE 2.13*  CALCIUM 9.3   Cardiac Enzymes:  Recent Labs  01/11/13 1158 01/11/13 1534  TROPONINI <0.30 <0.30   RADIOLOGY: Dg Chest 2 View 01/10/2013   CLINICAL DATA:  Shortness of breath  EXAM: CHEST  2 VIEW   COMPARISON:  11/28/2012  FINDINGS: No adverse features related to biventricular ICD/ pacer via left approach. No change in cardiopericardial enlargement.  Small bilateral pleural effusions or pleural scarring. No overt edema. Chronic lower lung opacities, particularly retrocardiac, were there is likely scarring. Right nipple shadow again noted.  IMPRESSION: 1. Cardiomegaly without edema. 2. Chronic small bilateral pleural effusions or pleural scarring. 3. Chronic lower lung scarring and atelectasis.   Electronically Signed   By: Tiburcio Pea   On: 01/10/2013 23:06    ASSESSMENT AND PLAN:  Principal Problem:   Chest pain Active Problems:   Nonischemic cardiomyopathy,  EF 35-40% by echo 07/2012.  -- now 20-25% 12/2012   VENTRICULAR TACHYCARDIA- on Amiodarone   Atrial fibrillation   CKD (chronic kidney disease) stage 3, GFR 30-59 ml/min   Acute on chronic systolic heart failure  1. Atypical atrial flutter and atrial fibrillation He is maintaining sinus rhythm with amiodarone post cardioversion.   I find this surprising given his LA size of 66mm with severe MR.  He does feel better in sinus.  Continue amiodarone and follow.  2. Atrial lead dislodgement  The patients chest xray  clearly reveals that his atrial lead is dislodged. It does not sense or capture at this point.  I have therefore programmed him VVIR.  He is V paced with retrograde atrial activity at this time. If I knew that he would maintain sinus then I would consider replacing his atrial lead.  I think for now though we should continue to observe through the weekend.  Dr Ladona Ridgel (his primary EP) can weight in on this further next week.  3. Nonischemic CM  The patient has a BiV ICD in place.   4. Acute on chronic renal failure  Limits our antiarrhythmic options  Creatinine was rising as of yesterday.  No BMET was ordered today.  5. Severe MR/ LA enlargement  This will make sinus rhythm maintenance long term difficult.  Consider  repeat echo to assess MR once medically optimized.  Though I suspect that his MR is due to annular dilatation, he might actually do better with mitral repair and surgical MAZE (if other medical problems do not prohibit). We will reserve this option until after we have assessed our ability to treat this patient medically.  I wouldn't want to revise his atrial lead if he was a surgical candidate as he could again have dislodgement with surgery.  IF he were to be a surgical candidate then atrial lead revision could be done at the time of surgery.  Addendum:  I spoke with Dr Herbie Baltimore who agrees with surgical consultation.  I have spoken with Dr Cornelius Moras who will evaluate the patient for possible MV repair and MAZE.

## 2013-01-14 NOTE — Progress Notes (Signed)
Pt transferred to 2900 as instructed by Dr. Gala Romney for further tx.   Notified pt and transferred as ordered.  Amanda Pea, Charity fundraiser.

## 2013-01-14 NOTE — Consult Note (Addendum)
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 Alanda Amass since 2001.   The patient reports that he originally presented with recurrent syncopal episodes and his stroke in 2001. He was found to have dilated cardiomyopathy and ventricular tachycardia. He underwent placement of an ICD at that time. In 2004 he underwent cardiac catheterization and was found to have nonobstructive coronary artery disease. In November 2013 he underwent upgrade of his ICD to a Medtronic biventricular ICD because his last ICD had reached early battery depletion.   Four months ago the patient was hospitalized in [place], [place] for acute exacerbation of CHF while he was on vacation at [facility]. He was noted to be in rapid atrial fibrillation at that time and underwent DC cardioversion on 2 occasions, both of which were unsuccessful. He apparently had a cardiac catheterization performed at that time which revealed nonobstructive coronary artery disease and an echocardiogram that revealed severe global LV dysfunction with EF estimated 15-20% and moderate-severe mitral regurgitation. He was started on amiodarone and Eliquis at that time.   This hospitalization is the fourth hospitalization over the past 4 months for recurrent acute exacerbations of chronic combined systolic and diastolic congestive heart failure that had been further aggravated by the presence of recurrent persistent atrial fibrillation. EP was consulted and he was seen by RDR. Allred. RA lead found to be dislodged. Underwent DC-CV of AF but felt to be at high risk for reverting to AF due to severe LA dilation. Dr. Cornelius Moras consulted for possible MVR  and Maze.   From functional standpoint, he states that he had been doing fairly well up until the past 6 months or so. He has a long-standing history of mild exertional shortness of breath, but over the past few months he has gotten to the point where he gets short of breath with minimal activity and frequently at rest. Very fatigues - sleeps a lot during the day. He's never had any chest pain or chest tightness. He has had frequent dizzy spells and a couple of brief syncopal episodes that has not been associated with ICD discharge.   Echo here which I reviewed personally showed EF 20-25% with akinesis of inferolateral wall and tethering of posterior MV leaflet with severe posterior MR. The RV is also moderately HK. There is severe biatrial enlargement.    Review of Systems: [y] = yes, [ ]  = no   General: Weight gain [ ] ; Weight loss [ ] ; Anorexia Cove.Etienne ]; Fatigue [ y]; Fever [ ] ; Chills [ ] ; Weakness [ ]   Cardiac: Chest pain/pressure [ ] ; Resting SOB Cove.Etienne ]; Exertional SOB [ y]; Orthopnea Cove.Etienne ]; Pedal Edema [ ] ; Palpitations [ ] ; Syncope [ ] ; Presyncope Cove.Etienne ]; Paroxysmal nocturnal dyspnea[ ]   Pulmonary: Cough [ ] ; Wheezing[ ] ; Hemoptysis[ ] ; Sputum [ ] ; Snoring [ ]   GI: Vomiting[ ] ; Dysphagia[ ] ; Melena[ ] ; Hematochezia [ ] ; Heartburn[ ] ; Abdominal pain [ ] ; Constipation [ ] ; Diarrhea [ ] ; BRBPR [ ]   GU: Hematuria[ ] ; Dysuria [ ] ; Nocturia[ ]   Vascular: Pain in legs with walking [ ] ; Pain in feet with lying flat [ ] ; Non-healing sores [ ] ; Stroke Cove.Etienne ]; TIA [ ] ;  Slurred speech [ ] ;  Neuro: Headaches[ ] ; Vertigo[ ] ; Seizures[ ] ; Paresthesias[ ] ;Blurred vision [ ] ; Diplopia [ ] ; Vision changes [ ]   Ortho/Skin: Arthritis [ y]; Joint pain Cove.Etienne ]; Muscle pain [ ] ; Joint swelling [ ] ; Back Pain [ ] ; Rash [ ]   Psych: Depression[ ] ; Anxiety[ ]   Heme: Bleeding problems [ ] ; Clotting disorders [ ] ; Anemia [ ]   Endocrine: Diabetes [ ] ; Thyroid dysfunction[ ]   Home Medications Prior to Admission medications    Medication Sig Start Date End Date Taking? Authorizing Provider  acetaminophen (TYLENOL) 500 MG tablet Take 1,000 mg by mouth every 6 (six) hours as needed for pain.   Yes Historical Provider, MD  allopurinol (ZYLOPRIM) 100 MG tablet Take 1 tablet by mouth every evening.  12/16/11  Yes Historical Provider, MD  amiodarone (PACERONE) 200 MG tablet Take 200 mg by mouth 2 (two) times daily.    Yes Historical Provider, MD  apixaban (ELIQUIS) 5 MG TABS tablet Take 5 mg by mouth 2 (two) times daily.   Yes Historical Provider, MD  atorvastatin (LIPITOR) 20 MG tablet Take 20 mg by mouth daily.   Yes Historical Provider, MD  Cholecalciferol 10000 UNITS CAPS Take 1 capsule by mouth daily.   Yes Historical Provider, MD  digoxin (LANOXIN) 0.125 MG tablet Take 0.125 mg by mouth every evening.   Yes Historical Provider, MD  donepezil (ARICEPT) 10 MG tablet Take 10 mg by mouth daily.   Yes Historical Provider, MD  furosemide (LASIX) 40 MG tablet Take 1 tablet (40 mg total) by mouth 2 (two) times daily. 12/02/12  Yes Luke K Kilroy, PA-C  lisinopril (PRINIVIL,ZESTRIL) 5 MG tablet Take 2.5 mg by mouth daily.   Yes Historical Provider, MD  metoprolol (LOPRESSOR) 100 MG tablet Take 0.5 tablets (50 mg total) by mouth 2 (two) times daily. 12/02/12  Yes Abelino Derrick, PA-C    Past Medical History: Past Medical History  Diagnosis Date  . Hypertension   . Chronic tension headaches   . Tinea cruris   . Tinea unguium   . Hernia, hiatal   . CVA (cerebral infarction)   . Systolic heart failure     EF 78-29% in 08/2012 (Echo at [facility], [place])  . Bradycardia   . Ventricular tachycardia   . Syncope   . CAD (coronary artery disease)     h/o MI in 2001, admitted for possible STEMI at [facility] in 08/2012 - Cardiac cath at [facility] in 08/2012: Nonobstructive coronary artery disease  . Sleep disorder   . Atrial fibrillation     On Eliquis, new onset in 08/2012 - successful cardioversion to sinus rhythm  .  Automatic implantable cardioverter-defibrillator in situ   . Pacemaker     ICD/PACEMAKER   . Arthritis     HANDS  . CHF (congestive heart failure)   . Anginal pain   . Depression   . Shortness of breath   . Pneumonia   . Heart murmur   . Stroke   . Mitral regurgitation   . Acute on chronic systolic heart failure-  10/22/2012  . Chronic anticoagulation- Eloquis   . CKD (chronic kidney disease) stage 3, GFR 30-59 ml/min   . 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 Atrial fibrillation, persistent, recurrent   . Chronic combined systolic and diastolic congestive heart failure   . Non-ischemic cardiomyopathy 1997  . Tricuspid regurgitation     Past Surgical History: Past Surgical History  Procedure Laterality Date  .  Vasectomy    . Icd implantation  06/12/08    Medtronic biventricular ICD, remote - no, Dr. Ladona Ridgel  . Cardiac catheterization  02/03/03    Dr. Jenne Campus   . Insert / replace / remove pacemaker      pacer/ICD    Family History: Family History  Problem Relation Age of Onset  . Cancer Mother     unclear what type  . Cancer Father     stomach    Social History: History   Social History  . Marital Status: Married    Spouse Name: N/A    Number of Children: N/A  . Years of Education: N/A   Occupational History  . Retired    Social History Main Topics  . Smoking status: Passive Smoke Exposure - Never Smoker  . Smokeless tobacco: Never Used     Comment: denies tobacco use   . Alcohol Use: No  . Drug Use: No  . Sexual Activity: None   Other Topics Concern  . None   Social History Narrative   Married and lives in [place].    Allergies:  Allergies  Allergen Reactions  . Penicillins     REACTION: Rash    Objective:    Vital Signs:   Temp:  [97.2 F (36.2 C)-98.5 F (36.9 C)] 97.2 F (36.2 C) (09/26 1349) Pulse Rate:  [68-70] 70 (09/26 1349) Resp:  [20-21] 20 (09/26 1349) BP: (100-111)/(68-74) 108/72 mmHg (09/26 1349) SpO2:  [91 %-98 %]  91 % (09/26 1349) Weight:  [75.479 kg (166 lb 6.4 oz)-76.613 kg (168 lb 14.4 oz)] 75.479 kg (166 lb 6.4 oz) (09/26 0505) Last BM Date: 01/13/13  Weight change: Filed Weights   01/13/13 0500 01/13/13 2004 01/14/13 0505  Weight: 76 kg (167 lb 8.8 oz) 76.613 kg (168 lb 14.4 oz) 75.479 kg (166 lb 6.4 oz)    Intake/Output:   Intake/Output Summary (Last 24 hours) at 01/14/13 1802 Last data filed at 01/14/13 1755  Gross per 24 hour  Intake    240 ml  Output   1400 ml  Net  -1160 ml     Physical Exam: General:  Fatigued appearing. Dyspneic at rest HEENT: normal Neck: supple. JVP to jaw with prominent CV waves. Carotids 2+ bilat; no bruits. No lymphadenopathy or thryomegaly appreciated. Cor: PMI laterally displaced. Regular rate & rhythm.2/6 MR. + s3 Lungs: clear Abdomen: soft, nontender, + distended. No hepatosplenomegaly. No bruits or masses. Good bowel sounds. Extremities: no cyanosis, clubbing, rash, edema Neuro: alert & orientedx3, cranial nerves grossly intact. moves all 4 extremities w/o difficulty. Affect pleasant  Telemetry: SR with v-pacing  Labs: Basic Metabolic Panel:  Recent Labs Lab 01/10/13 2204 01/11/13 0420 01/13/13 0957  NA 143 144 139  K 4.9 4.5 5.0  CL 107 109 104  CO2 26 24 25   GLUCOSE 89 91 101*  BUN 32* 32* 38*  CREATININE 1.93* 1.85* 2.13*  CALCIUM 9.0 8.9 9.3    Liver Function Tests:  Recent Labs Lab 01/11/13 0420  AST 27  ALT 25  ALKPHOS 85  BILITOT 1.7*  PROT 6.3  ALBUMIN 3.6   No results found for this basename: LIPASE, AMYLASE,  in the last 168 hours No results found for this basename: AMMONIA,  in the last 168 hours  CBC:  Recent Labs Lab 01/10/13 2204 01/11/13 0420  WBC 7.7 7.8  NEUTROABS  --  4.7  HGB 14.4 14.4  HCT 42.4 42.4  MCV 88.3 88.3  PLT 209 191  Cardiac Enzymes:  Recent Labs Lab 01/11/13 0418 01/11/13 1158 01/11/13 1534  TROPONINI <0.30 <0.30 <0.30    BNP: BNP (last 3 results)  Recent Labs   11/30/12 0455 01/10/13 2204 01/11/13 1158  PROBNP 29987.0* 27226.0* 21360.0*    CBG: No results found for this basename: GLUCAP,  in the last 168 hours  Coagulation Studies: No results found for this basename: LABPROT, INR,  in the last 72 hours  Other results: EKG: SR with v pacing at 71  Imaging:  No results found.   Medications:     Current Medications: . allopurinol  100 mg Oral QPM  . amiodarone  200 mg Oral BID  . atorvastatin  20 mg Oral Daily  . digoxin  0.125 mg Oral QPM  . donepezil  10 mg Oral Daily  . lisinopril  2.5 mg Oral Daily  . metoprolol  50 mg Oral BID  . sodium chloride  3 mL Intravenous Q12H     Infusions: . sodium chloride    . heparin        Assessment:   1. A/C systolic HF with biventricular dysfunction 2. Severe mitral regurgitation 3. Recurrent AF - now in SR after DC-CV 4. A/c renal failure, stage IV 5. H/o CVA  Plan/Discussion:     Tough case. He has had longstanding LV dysfunction with renal insufficiency but has done well over the years. Now he presents with low output symptoms in the setting of biventricular HF with severe MR and recurrent AF. I agree that we need to do something now or he will likely not survive 6 months. I have reviewed his echo and suspect that his MV is not suitable for clipping so the question is whether MVR/Maze is the best option or whether we should consider going straight to a VAD. VAD placement would be complicated by RV dysfunction and renal insufficiency.  Currently he is volume overloaded and appears to have low output. I think the best option now is to place a PA catheter to clearly assess his current state and try to optimize his cardiac and renal function with milrinone and diuresis over the weekend. I have d/w Dr. Cornelius Moras who agrees. We will also involve Dr. Donata Clay to also assess for VAD.     Daniel Bensimhon,MD 6:41 PM   Length of Stay: 4 Advanced Heart Failure Team Pager [phone] (M-F;  7a - 4p)  Please contact [facility] for night-coverage after hours (4p -7a ) and weekends on [email or web address]

## 2013-01-14 NOTE — Progress Notes (Signed)
Subjective:  Pt in BR_ no complaints  Objective:  Vital Signs in the last 24 hours: Temp:  [97.2 F (36.2 C)-98.5 F (36.9 C)] 98.5 F (36.9 C) (09/26 0505) Pulse Rate:  [68-70] 69 (09/26 1013) Resp:  [20-21] 20 (09/26 0505) BP: (100-111)/(68-77) 108/68 mmHg (09/26 1011) SpO2:  [94 %-98 %] 94 % (09/26 0505) Weight:  [166 lb 6.4 oz (75.479 kg)-168 lb 14.4 oz (76.613 kg)] 166 lb 6.4 oz (75.479 kg) (09/26 0505)  Intake/Output from previous day:  Intake/Output Summary (Last 24 hours) at 01/14/13 1050 Last data filed at 01/14/13 0509  Gross per 24 hour  Intake    360 ml  Output   1250 ml  Net   -890 ml    Physical Exam: Per MD   Rate: 70  Rhythm: paced  Lab Results: No results found for this basename: WBC, HGB, PLT,  in the last 72 hours  Recent Labs  01/13/13 0957  NA 139  K 5.0  CL 104  CO2 25  GLUCOSE 101*  BUN 38*  CREATININE 2.13*    Recent Labs  01/11/13 1158 01/11/13 1534  TROPONINI <0.30 <0.30   No results found for this basename: INR,  in the last 72 hours  Imaging: Imaging results have been reviewed  Cardiac Studies:  Assessment/Plan:   Principal Problem:   Acute on chronic systolic heart failure- (doesn't tolerate AF) Active Problems:   Nonischemic cardiomyopathy,  EF 35-40% by echo 07/2012.  -- now 20-25% 12/2012   PAF (paroxysmal atrial fibrillation)- DCCV 01/12/13   ICD '04, upgraded to BiV ICD 2/10 and 11/13- ? 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 S/P DCCV 01/12/13. Amiodarone increased to BID. Chronic anticoagulation with Eloquis. ? If atrial lead is dislodged.  Corine Shelter PA-C Beeper [phone] 01/14/2013, 10:50 AM

## 2013-01-14 NOTE — Consult Note (Addendum)
[address]       Nathan Mckinney [address]             [id number]          CARDIOTHORACIC SURGERY CONSULTATION REPORT  Nathan is Nathan Mckinney Referring Provider is Nathan Range, MD Primary Cardiologist is Nathan Griffins, MD   Reason for consultation:  Severe mitral regurgitation and atrial fibrillation  HPI:  Patient is a 72 year old retired white male from [place] with long history of dilated nonischemic cardiomyopathy, ventricular tachycardia and complete heart block who has been followed by Nathan Mckinney since 2001. The patient reports that he originally presented with recurrent syncopal episodes and his stroke in 2001. He was found to have dilated cardiomyopathy and ventricular tachycardia. He underwent placement of a defibrillator at that time. To the patient's recollection he has never had an ICD discharge since then.  In 2004 he underwent cardiac catheterization and was found to have nonobstructive coronary artery disease.  He apparently had a negative stress Myoview exam in 2010. In November 2013 he underwent upgrade of his ICD to a Medtronic biventricular ICD because his last ICD had reached early battery depletion.  Four months ago the patient was hospitalized in [place], [place] for acute exacerbation of CHF while he was on vacation at [facility].  He was noted to be in rapid atrial fibrillation at that time and underwent DC cardioversion on 2 occasions, both of which were unsuccessful. He apparently had a cardiac catheterization performed at that time which revealed nonobstructive coronary artery disease and an echocardiogram that revealed severe global LV dysfunction with EF estimated 15-20% and moderate-severe mitral regurgitation.  He was started on amiodarone and Eliquis at that time.  This hospitalization is the fourth hospitalization over the past 4 months for recurrent acute exacerbations of chronic combined systolic and diastolic congestive heart  failure that had been further aggravated by the presence of recurrent persistent atrial fibrillation.  Over the course of the summer the patient was seen in consultation by Nathan Mckinney and felt to be a relatively poor candidate for Afib ablation.  EP consult was again requested and the patient was seen earlier this week by Nathan Mckinney who has now requested a surgical consult for possible mitral valve repair or replacement with or without concomitant Maze procedure.  Patient has been retired since 2001. He [place] with his wife and they currently are in the process of moving to a townhome.  He states that he had been doing fairly well up until the past 6 months or so. He has a long-standing history of mild exertional shortness of breath, but over the past few months he has gotten to the point where he gets short of breath with minimal activity and frequently at rest. He's never had any chest pain or chest tightness. He has had frequent dizzy spells and a couple of brief syncopal episodes that has not been associated with ICD discharge. Prior to this current hospital in addition the patient had class IV symptoms including resting shortness breath and orthopnea. He has not had much problems with lower extremity edema. His functional status and physical mobility is limited only by shortness of breath and fatigue.   Past Medical History  Diagnosis Date  . Hypertension   . Chronic tension headaches   . Tinea cruris   . Tinea unguium   . Hernia, hiatal   . CVA (cerebral infarction)   . Systolic heart  failure     EF 15-20% in 08/2012 (Echo at [facility] [place])  . Bradycardia   . Ventricular tachycardia   . Syncope   . CAD (coronary artery disease)     h/o MI in 2001, admitted for possible STEMI at [facility] in 08/2012 - Cardiac cath at [facility] in 08/2012: Nonobstructive coronary artery disease  . Sleep disorder   . Atrial fibrillation     On Eliquis, new onset in 08/2012  - successful cardioversion to sinus rhythm  . Automatic implantable cardioverter-defibrillator in situ   . Pacemaker     ICD/PACEMAKER   . Arthritis     HANDS  . CHF (congestive heart failure)   . Anginal pain   . Depression   . Shortness of breath   . Pneumonia   . Heart murmur   . Stroke   . Mitral regurgitation   . Acute on chronic systolic heart failure-  10/22/2012  . Chronic anticoagulation- Eloquis   . CKD (chronic kidney disease) stage 3, GFR 30-59 ml/min   . 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 Atrial fibrillation, persistent, recurrent   . Chronic combined systolic and diastolic congestive heart failure   . Non-ischemic cardiomyopathy 1997  . Tricuspid regurgitation     Past Surgical History  Procedure Laterality Date  . Vasectomy    . Icd implantation  06/12/08    Medtronic biventricular ICD, remote - no, Nathan Mckinney  . Cardiac catheterization  02/03/03    Dr. Jenne Campus   . Insert / replace / remove pacemaker      pacer/ICD    Family History  Problem Relation Age of Onset  . Cancer Mother     unclear what type  . Cancer Father     stomach    History   Social History  . Marital Status: Married    Spouse Name: N/A    Number of Children: N/A  . Years of Education: N/A   Occupational History  . Retired    Social History Main Topics  . Smoking status: Passive Smoke Exposure - Never Smoker  . Smokeless tobacco: Never Used     Comment: denies tobacco use   . Alcohol Use: No  . Drug Use: No  . Sexual Activity: Not on file   Other Topics Concern  . Not on file   Social History Narrative   Married and lives in [place]    Prior to Admission medications   Medication Sig Start Date End Date Taking? Authorizing Provider  acetaminophen (TYLENOL) 500 MG tablet Take 1,000 mg by mouth every 6 (six) hours as needed for pain.   Yes Historical Provider, MD  allopurinol (ZYLOPRIM) 100 MG tablet Take 1 tablet by mouth every evening.  12/16/11  Yes  Historical Provider, MD  amiodarone (PACERONE) 200 MG tablet Take 200 mg by mouth 2 (two) times daily.    Yes Historical Provider, MD  apixaban (ELIQUIS) 5 MG TABS tablet Take 5 mg by mouth 2 (two) times daily.   Yes Historical Provider, MD  atorvastatin (LIPITOR) 20 MG tablet Take 20 mg by mouth daily.   Yes Historical Provider, MD  Cholecalciferol 10000 UNITS CAPS Take 1 capsule by mouth daily.   Yes Historical Provider, MD  digoxin (LANOXIN) 0.125 MG tablet Take 0.125 mg by mouth every evening.   Yes Historical Provider, MD  donepezil (ARICEPT) 10 MG tablet Take 10 mg by mouth daily.   Yes Historical Provider, MD  furosemide (LASIX) 40  MG tablet Take 1 tablet (40 mg total) by mouth 2 (two) times daily. 12/02/12  Yes Luke K Kilroy, PA-C  lisinopril (PRINIVIL,ZESTRIL) 5 MG tablet Take 2.5 mg by mouth daily.   Yes Historical Provider, MD  metoprolol (LOPRESSOR) 100 MG tablet Take 0.5 tablets (50 mg total) by mouth 2 (two) times daily. 12/02/12  Yes Abelino Derrick, PA-C    Current Facility-Administered Medications  Medication Dose Route Frequency Provider Last Rate Last Dose  . 0.9 %  sodium chloride infusion  250 mL Intravenous Continuous Marykay Lex, MD      . acetaminophen (TYLENOL) tablet 650 mg  650 mg Oral Q4H PRN Lynden Oxford, MD      . allopurinol (ZYLOPRIM) tablet 100 mg  100 mg Oral QPM Lynden Oxford, MD   100 mg at 01/13/13 1821  . amiodarone (PACERONE) tablet 200 mg  200 mg Oral BID Lynden Oxford, MD   200 mg at 01/14/13 1010  . apixaban (ELIQUIS) tablet 5 mg  5 mg Oral BID April K Palumbo-Rasch, MD   5 mg at 01/14/13 1012  . atorvastatin (LIPITOR) tablet 20 mg  20 mg Oral Daily Lynden Oxford, MD   20 mg at 01/14/13 1013  . digoxin (LANOXIN) tablet 0.125 mg  0.125 mg Oral QPM Lynden Oxford, MD   0.125 mg at 01/13/13 1822  . donepezil (ARICEPT) tablet 10 mg  10 mg Oral Daily Lynden Oxford, MD   10 mg at 01/14/13 1014  . furosemide (LASIX) injection 40 mg  40 mg Intravenous BID Marykay Lex, MD   40 mg at 01/13/13 4403  . hydrocortisone cream 1 % 1 application  1 application Topical TID PRN Marykay Lex, MD      . lisinopril (PRINIVIL,ZESTRIL) tablet 2.5 mg  2.5 mg Oral Daily Lynden Oxford, MD   2.5 mg at 01/14/13 1011  . metoprolol tartrate (LOPRESSOR) tablet 50 mg  50 mg Oral BID Lynden Oxford, MD   50 mg at 01/14/13 1013  . ondansetron (ZOFRAN) injection 4 mg  4 mg Intravenous Q6H PRN Lynden Oxford, MD   4 mg at 01/12/13 2137  . sodium chloride 0.9 % injection 3 mL  3 mL Intravenous Q12H Marykay Lex, MD   3 mL at 01/13/13 2213  . sodium chloride 0.9 % injection 3 mL  3 mL Intravenous PRN Marykay Lex, MD        Allergies  Allergen Reactions  . Penicillins     REACTION: Rash      Review of Systems:   General:  decreased appetite, decreased energy, + weight gain, + weight loss with diuresis, no fever  Cardiac:  no chest pain with exertion, no chest pain at rest, +SOB with minimal exertion, + intermittent resting SOB, no PND, + orthopnea, + palpitations, + arrhythmia, + atrial fibrillation, + LE edema, + dizzy spells, + syncope  Respiratory:  + shortness of breath, NO home oxygen, NO productive cough, + dry cough, no bronchitis, no wheezing, no hemoptysis, no asthma, no pain with inspiration or cough, no sleep apnea, no CPAP at night  GI:   no difficulty swallowing, no reflux, no frequent heartburn, no hiatal hernia, no abdominal pain, no constipation, no diarrhea, no hematochezia, no hematemesis, no melena  GU:   no dysuria,  no frequency, no urinary tract infection, no hematuria, no enlarged prostate, no kidney stones, + chronic kidney disease  Vascular:  no pain suggestive of claudication, no pain in feet, no  leg cramps, no varicose veins, no DVT, no non-healing foot ulcer  Neuro:   + remote stroke, no TIA's, no seizures, no headaches, no temporary blindness one eye,  no slurred speech, no peripheral neuropathy, no chronic pain, no instability of gait, no  memory/cognitive dysfunction  Musculoskeletal: mild arthritis in hands, no joint swelling, no myalgias, no difficulty walking, normal mobility   Skin:   no rash, no itching, no skin infections, no pressure sores or ulcerations  Psych:   no anxiety, + depression, no nervousness, no unusual recent stress  Eyes:   no blurry vision, no floaters, no recent vision changes, + wears glasses or contacts  ENT:   + hearing loss, no loose or painful teeth, no dentures  Hematologic:  no easy bruising, no abnormal bleeding, no clotting disorder, no frequent epistaxis  Endocrine:  no diabetes, does not check CBG's at home     Physical Exam:   BP 108/72  Pulse 70  Temp(Src) 97.2 F (36.2 C) (Oral)  Resp 20  Ht 5\' 11"  (1.803 m)  Wt 75.479 kg (166 lb 6.4 oz)  BMI 23.22 kg/m2  SpO2 91%  General:  Thin,  well-appearing  HEENT:  Unremarkable   Neck:   no JVD, no bruits, no adenopathy   Chest:   Bibasilar rales R>L,, symmetrical breath sounds, no wheezes, no rhonchi   CV:   RRR, grade III/VI holosystolic murmur   Abdomen:  soft, non-tender, no masses   Extremities:  warm, well-perfused, pulses diminished  Rectal/GU  Deferred  Neuro:   Grossly non-focal and symmetrical throughout  Skin:   Clean and dry, no rashes, no breakdown  Diagnostic Tests:  Transthoracic Echocardiography  Patient: Nathan Mckinney, Nathan Mckinney MR #: [id number] Study Date: 01/11/2013 Gender: M Age: 27 Height: 180.3cm Weight: 77.6kg BSA: 1.4m^2 Pt. Status: Room: 2H28C  PERFORMING Shvc ATTENDING Croitoru, Mihai SONOGRAPHER Georgian Co, RDCS, CCT ORDERING Simmons, Brittainy Phil Dopp, Pranav cc:  ------------------------------------------------------------ LV EF: 20% - 25%  ------------------------------------------------------------ Indications: CHF.  ------------------------------------------------------------ History: PMH: Atrial fibrillation. Angina pectoris. Coronary artery disease. Risk factors: Non  ischemic Cardiomyopathy. Dyspnea on exertion.ICD in place. Chronic kidney disease. V-Tach.  ------------------------------------------------------------ Study Conclusions  - Left ventricle: The cavity size was severely dilated. Wall thickness was normal. Systolic function was severely reduced. The estimated ejection fraction was in the Mckinney of 20% to 25%. Diffuse hypokinesis. Akinesis of the entireinferolateral myocardium. Aneurysmal deformity of the basal-midinferolateral myocardium. Severe hypokinesis of the apical myocardium. The study is not technically sufficient to allow evaluation of LV diastolic function. - Mitral valve: Moderately dilated annulus. There was malcoaptation of the valve leaflets, due to ischemic tethering of the posterior leaflet. Severe regurgitation directed eccentrically and posteriorly. The acceleration rate of the regurgitant jet was reduced, consistent with a low dP/dt. - Left atrium: The atrium was severely dilated. - Right ventricle: The cavity size was mildly dilated. Systolic function was moderately reduced. - Right atrium: The atrium was moderately to severely dilated. - Atrial septum: No defect or patent foramen ovale was identified. - Tricuspid valve: Moderate regurgitation. - Pulmonary arteries: Systolic pressure was moderately increased. PA peak pressure: 55mm Hg (S). Transthoracic echocardiography. M-mode, complete 2D, spectral Doppler, and color Doppler. Height: Height: 180.3cm. Height: 71in. Weight: Weight: 77.6kg. Weight: 170.6lb. Body mass index: BMI: 23.8kg/m^2. Body surface area: BSA: 1.101m^2. Blood pressure: 105/82. Patient status: Inpatient. Location: ICU.  ------------------------------------------------------------  ------------------------------------------------------------ Left ventricle: The cavity size was severely dilated. Wall thickness was normal. Systolic function was severely reduced. The estimated ejection  fraction was in the Mckinney of 20% to 25%. Diffuse hypokinesis. Regional wall motion abnormalities: Akinesis of the entireinferolateral myocardium. Aneurysmal deformity of the basal-midinferolateral myocardium. Severe hypokinesis of the apical myocardium. The study is not technically sufficient to allow evaluation of LV diastolic function.  ------------------------------------------------------------ Aortic valve: Structurally normal valve. Trileaflet. Cusp separation was normal. Doppler: Transvalvular velocity was within the normal Mckinney. There was no stenosis. No regurgitation.  ------------------------------------------------------------ Aorta: Aortic root: The aortic root was normal in size. Ascending aorta: The ascending aorta was normal in size.  ------------------------------------------------------------ Mitral valve: Moderately dilated annulus. There was malcoaptation of the valve leaflets, due to ischemic tethering of the posterior leaflet. Doppler: Severe regurgitation directed eccentrically and posteriorly. The acceleration rate of the regurgitant jet was reduced, consistent with a low dP/dt.  ------------------------------------------------------------ Left atrium: The atrium was severely dilated.  ------------------------------------------------------------ Atrial septum: No defect or patent foramen ovale was identified.  ------------------------------------------------------------ Right ventricle: The cavity size was mildly dilated. Pacer wire or catheter noted in right ventricle. Systolic function was moderately reduced.  ------------------------------------------------------------ Pulmonic valve: The valve appears to be grossly normal. Doppler: Trivial regurgitation.  ------------------------------------------------------------ Tricuspid valve: Structurally normal valve. Leaflet separation was normal. Doppler: Transvalvular velocity was within the normal  Mckinney. Moderate regurgitation.  ------------------------------------------------------------ Pulmonary artery: Systolic pressure was moderately increased.  ------------------------------------------------------------ Right atrium: The atrium was moderately to severely dilated. Pacer wire or catheter noted in right atrium.  ------------------------------------------------------------ Pericardium: There was no pericardial effusion.  ------------------------------------------------------------ Systemic veins: Inferior vena cava: The vessel was dilated; the respirophasic diameter changes were blunted (< 50%); findings are consistent with elevated central venous pressure.  ------------------------------------------------------------  2D measurements Normal Doppler measurements Normal Left ventricle Main pulmonary LVID ED, 79.4 mm 43-52 artery chord, Pressure, S 55 mm =30 PLAX Hg LVID ES, 74.2 mm 23-38 Tricuspid valve chord, Regurg peak 286 cm/s ------ PLAX vel FS, chord, 7 % >29 Peak RV-RA 33 mm ------ PLAX gradient, S Hg LVPW, ED 7.78 mm ------ IVS/LVPW 1.14 <1.3 ratio, ED Ventricular septum IVS, ED 8.85 mm ------ Aorta Root diam, 40 mm ------ ED Left atrium AP dim 66 mm ------ AP dim 3.35 cm/m^2 <2.2 index  ------------------------------------------------------------ Prepared and Electronically Authenticated by  Croitoru, Mihai 2014-09-23T16:30:[id number]     Impression:  Patient has long-standing dilated nonischemic cardiomyopathy with severe mitral regurgitation. I personally reviewed the patient's recent transthoracic echocardiogram. He has severe biventricular dysfunction with severe downward displacement of the entire mitral apparatus and severe mitral regurgitation with severe biatrial enlargement.  On echocardiogram there is suggestion of significant pulmonary hypertension and at least moderate tricuspid regurgitation.  He has developed recurrent persistent  atrial fibrillation and now has been hospitalized on 4 occasions over the past 4 months with acute exacerbations of chronic combined systolic and diastolic congestive heart failure.  I think the likelihood that this patient will maintain sinus rhythm without definitive management of his mitral regurgitation is extremely low.  More importantly, I think his prognosis is extremely poor if something isn't done soon to reverse his downward spiral with worsening CHF.    Plan:  I recommend transesophageal echocardiogram and right heart catheterization to further investigate whether or not the patient might tolerate elective mitral valve repair or replacement with or without concomitant Maze procedure and/or tricuspid valve repair.  It may or may not be too late, and he might ultimately be heading for mechanical circulatory support if he is felt to be a candidate.  I recommend obtaining a copy of the cardiac catheterization films  performed at [facility].  If this is not feasible the patient will need left heart catheterization as well.  I would favor stopping Eliquis and starting heparin.  I feel strongly that the CHF team should be involved.  I will followup on Monday.   Salvatore Decent. Cornelius Moras, MD 01/14/2013 3:28 PM  I spent in excess of 90 minutes of time directly involved in the conduct of this consultation.

## 2013-01-14 NOTE — Progress Notes (Signed)
Pt. Seen and examined. Agree with the NP/PA-C note as written.  Cardioverted to sinus with pacing. On amiodarone and xarelto. Given his severe MR and dilated MV annulus, it is reasonable to evaluate for annuloplasty and surgical MAZE. Appreciate Dr. Jenel Lucks recommendations and Dr. Orvan July expert opinion. ?if atrial lead could be repositioned during surgery.  Chrystie Nose, MD, [facility] Attending Cardiologist [facility]

## 2013-01-14 NOTE — Progress Notes (Addendum)
   Swan #s measured personally with nurse at bedside:  RA 15 PA 54/30 (39) PCWP ~31 (v=45) - difficult wave form due to severe v-waves Fick CO/CI = 2.8/1.4 Thermo CO/CI 2.4/1.2 PVR  3.4 WU  Will start milrinone and IV lasix. D/c b-blocker (low output) and ACE-I (renal failure).   Daniel Bensimhon,MD 8:20 PM

## 2013-01-14 NOTE — Progress Notes (Signed)
Report given to charge nurse as primary nurse is busy assist Dr. Derenda Fennel as instructed by charge nurse.  Amanda Pea, Charity fundraiser.

## 2013-01-15 ENCOUNTER — Inpatient Hospital Stay (HOSPITAL_COMMUNITY): Payer: Medicare Other

## 2013-01-15 LAB — CARBOXYHEMOGLOBIN
Carboxyhemoglobin: 1.8 % — ABNORMAL HIGH (ref 0.5–1.5)
Methemoglobin: 1.1 % (ref 0.0–1.5)

## 2013-01-15 LAB — BASIC METABOLIC PANEL
CO2: 27 mEq/L (ref 19–32)
Calcium: 8.2 mg/dL — ABNORMAL LOW (ref 8.4–10.5)
Chloride: 102 mEq/L (ref 96–112)
Creatinine, Ser: 1.82 mg/dL — ABNORMAL HIGH (ref 0.50–1.35)
GFR calc Af Amer: 40 mL/min — ABNORMAL LOW (ref >90)
Sodium: 137 mEq/L (ref 135–145)

## 2013-01-15 LAB — CBC
HCT: 39.1 % (ref 39.0–52.0)
Hemoglobin: 13.3 g/dL (ref 13.0–17.0)
RBC: 4.47 MIL/uL (ref 4.22–5.81)
RDW: 15.4 % (ref 11.5–15.5)
WBC: 6.7 10*3/uL (ref 4.0–10.5)

## 2013-01-15 LAB — APTT
aPTT: 141 seconds — ABNORMAL HIGH (ref 24–37)
aPTT: 51 seconds — ABNORMAL HIGH (ref 24–37)

## 2013-01-15 LAB — HEPARIN LEVEL (UNFRACTIONATED): Heparin Unfractionated: >2.2 IU/mL — ABNORMAL HIGH (ref 0.30–0.70)

## 2013-01-15 MED ORDER — HEPARIN (PORCINE) IN NACL 100-0.45 UNIT/ML-% IJ SOLN
800.0000 [IU]/h | INTRAMUSCULAR | Status: DC
  Filled 2013-01-15: qty 250

## 2013-01-15 MED ORDER — FUROSEMIDE 40 MG PO TABS
40.0000 mg | ORAL_TABLET | Freq: Every day | ORAL | Status: DC
  Administered 2013-01-15 – 2013-01-16 (×2): 40 mg via ORAL
  Filled 2013-01-15 (×3): qty 1

## 2013-01-15 MED ORDER — HEPARIN (PORCINE) IN NACL 100-0.45 UNIT/ML-% IJ SOLN
1000.0000 [IU]/h | INTRAMUSCULAR | Status: AC
  Administered 2013-01-15: 950 [IU]/h via INTRAVENOUS
  Administered 2013-01-17: 1050 [IU]/h via INTRAVENOUS
  Administered 2013-01-18: 1000 [IU]/h via INTRAVENOUS
  Filled 2013-01-15 (×5): qty 250

## 2013-01-15 NOTE — Consult Note (Signed)
PHARMACY CONSULT NOTE  Pharmacy Consult:  Heparin Indication: Transition from Eliquis [Apixaban] to Heparin infusion for atrial fibrillation   Allergies: Allergies  Allergen Reactions  . Penicillins     REACTION: Rash    Height/Weight: Height: 5\' 11"  (180.3 cm) Weight: 167 lb 1.7 oz (75.8 kg) IBW/kg (Calculated) : 75.3 Dosing weight 75.5 kg  Vital Signs: BP 133/77  Pulse 73  Temp(Src) 97.7 F (36.5 C) (Core (Comment))  Resp 20  Ht 5\' 11"  (1.803 m)  Wt 167 lb 1.7 oz (75.8 kg)  BMI 23.32 kg/m2  SpO2 95%   Labs:  Recent Labs  01/13/13 0957 01/15/13 0500 01/15/13 1749  HGB  --  13.3  --   HCT  --  39.1  --   PLT  --  162  --   APTT  --  141* 51*  HEPARINUNFRC  --  >2.20* >2.20*  CREATININE 2.13* 1.82*  --    Lab Results  Component Value Date   INR 2.2* 06/13/2008   INR 1.7* 06/12/2008   INR 2.1* 06/09/2008   Estimated Creatinine Clearance: 37.9 ml/min (by C-G formula based on Cr of 1.82).   Assessment: 72 yom transitioning from chronic Apixaban dosing to Heparin infusion during evaluation of patient for possible MVR +/- MAZE procedure. Patient previously on  Apixaban 5 mg q 12 hours.  Last dose am of  01/14/13. Heparin level still elevated due to recent apixiban and aPTT= 51 after heparin held and decreased to 800 units/hr (due to aPTT=141)   Goal of Therapy:   Heparin level 0.3-0.7 units/ml after apixaban has worn off and aPTT  66-102 sec   Plan:  - Increase heparin to 950 units/hr - Heparin level in 8 hrs  Harland German, Pharm D 01/15/2013 9:48 PM

## 2013-01-15 NOTE — Progress Notes (Signed)
[facility] Progress Note  Subjective:  Breather better  Objective:   Vital Signs in the last 24 hours: Temp:  [96.6 F (35.9 C)-97.9 F (36.6 C)] 96.6 F (35.9 C) (09/27 0700) Pulse Rate:  [68-71] 69 (09/27 0700) Resp:  [0-24] 16 (09/27 0700) BP: (99-125)/(59-89) 114/70 mmHg (09/27 0700) SpO2:  [91 %-100 %] 94 % (09/27 0700) Weight:  [167 lb 1.7 oz (75.8 kg)] 167 lb 1.7 oz (75.8 kg) (09/27 0500)  Intake/Output from previous day: 09/26 0701 - 09/27 0700 In: 1495.4 [P.O.:830; I.V.:625.4] Out: 1200 [Urine:1200]  Scheduled: . allopurinol  100 mg Oral QPM  . amiodarone  200 mg Oral BID  . atorvastatin  20 mg Oral Daily  . digoxin  0.125 mg Oral QPM  . donepezil  10 mg Oral Daily  . furosemide  40 mg Oral Daily  . sodium chloride  3 mL Intravenous Q12H   . sodium chloride    . sodium chloride    . heparin    . milrinone 0.25 mcg/kg/min (01/14/13 2034)   Physical Exam:   General appearance: alert, cooperative and no distress Neck: JVD 7 - 8 cm above sternal notch, no adenopathy, no carotid bruit, no JVD, supple, symmetrical, trachea midline and thyroid not enlarged, symmetric, no tenderness/mass/nodules Lungs: clear to auscultation bilaterally Heart: regular rate and rhythm 2/6 MR murmur loudest at apex Abdomen: soft, non-tender; bowel sounds normal; no masses,  no organomegaly Extremities: no edema, redness or tenderness in the calves or thighs Skin: Skin color, texture, turgor normal. No rashes or lesions Neurologic: Grossly normal   Rate: 70  Rhythm: v paced  Lab Results:    Recent Labs  01/13/13 0957 01/15/13 0500  NA 139 137  K 5.0 3.9  CL 104 102  CO2 25 27  GLUCOSE 101* 87  BUN 38* 36*  CREATININE 2.13* 1.82*   No results found for this basename: TROPONINI, CK, MB,  in the last 72 hours Hepatic Function Panel No results found for this basename: PROT, ALBUMIN, AST, ALT, ALKPHOS, BILITOT, BILIDIR, IBILI,  in the last 72  hours No results found for this basename: INR,  in the last 72 hours BNP (last 3 results)  Recent Labs  11/30/12 0455 01/10/13 2204 01/11/13 1158  PROBNP 29987.0* 27226.0* 21360.0*    Lipid Panel     Component Value Date/Time   CHOL 183 01/30/2012 1607   TRIG 201* 01/30/2012 1607   HDL 38* 01/30/2012 1607   CHOLHDL 4.8 01/30/2012 1607   VLDL 40 01/30/2012 1607   LDLCALC 105* 01/30/2012 1607      Imaging:  Dg Chest Port 1 View  01/15/2013   CLINICAL DATA:  Congestive heart failure. Swan-Ganz catheter placement.  EXAM: PORTABLE CHEST - 1 VIEW  COMPARISON:  01/10/2013  FINDINGS: Cardiomegaly remains stable. Increased bibasilar airspace disease is seen which is symmetric and suspicious for pulmonary edema. A new right IJ Swan-Ganz catheter seen with tip overlying the right lower lobe pulmonary artery. No pneumothorax identified. AICD remains in place.  IMPRESSION: Congestive heart failure with mild worsening of pulmonary edema.  Right IJ Swan-Ganz catheter tip in right lower lobe pulmonary artery. No pneumothorax visualized.   Electronically Signed   By: Myles Rosenthal   On: 01/15/2013 08:21   Dg Fluoro Guide Cv Line-no Report  01/14/2013   CLINICAL DATA: cv line   FLOURO GUIDE CV LINE  Fluoroscopy was utilized by the requesting physician.  No radiographic  interpretation.  Assessment/Plan:   Principal Problem:   Acute on chronic systolic heart failure- (doesn't tolerate AF) Active Problems:   CAD- 40% RCA, 50% LAD 2004   Nonischemic cardiomyopathy,  EF 35-40% by echo 07/2012.  -- now 20-25% 12/2012   VENTRICULAR TACHYCARDIA- on Amiodarone   Atrial fibrillation, persistent, recurrent   CVA 2004   SLEEP DISORDER   ICD '04, upgraded to BiV ICD 2/10 and 11/13- ? 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 Good response to milrinone. Cr improved to 1.82. Appreciate Dr. Barry Dienes assessment and advanced heart failure team. V paced; no further AF on amiodarone and heparin. Will discuss with team re: MVR/Maze.    Lennette Bihari, MD, [facility] 01/15/2013, 8:48 AM

## 2013-01-15 NOTE — Progress Notes (Signed)
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 Alanda Amass since 2001. Admitted with AF, cardiogenic shock EF 20% with severe MR. Underwent DC-CV.   Swan placed last night. Started on milrinone. Hemodynamics and renal function much improved. Feels better. No orthopnea or PND. Still in NSR.  Ernestine Conrad numbers done with nurse at bedside.   CVP 7 PA 36/17 (25) PCWP 17 with prominent v waves Thermo CO/CI 4.0/2.0 Co-ox 71%  Objective:    Vital Signs:   Temp:  [96.6 F (35.9 C)-97.9 F (36.6 C)] 96.6 F (35.9 C) (09/27 0700) Pulse Rate:  [68-71] 69 (09/27 0700) Resp:  [0-24] 16 (09/27 0700) BP: (99-125)/(59-89) 114/70 mmHg (09/27 0700) SpO2:  [91 %-100 %] 94 % (09/27 0700) Weight:  [75.8 kg (167 lb 1.7 oz)] 75.8 kg (167 lb 1.7 oz) (09/27 0500) Last BM Date: 01/14/13  Weight change: Filed Weights   01/13/13 2004 01/14/13 0505 01/15/13 0500  Weight: 76.613 kg (168 lb 14.4 oz) 75.479 kg (166 lb 6.4 oz) 75.8 kg (167 lb 1.7 oz)    Intake/Output:   Intake/Output Summary (Last 24 hours) at 01/15/13 0817 Last data filed at 01/15/13 0700  Gross per 24 hour  Intake 1495.4 ml  Output   1200 ml  Net  295.4 ml     Physical Exam: General:  Comfortable. Lying flat.  HEENT: normal Neck: supple. JVP 7-8. Carotids 2+ bilat; no bruits. No lymphadenopathy or thryomegaly appreciated. Cor: PMI laterally displaced. Regular rate & rhythm.2/6 MR. + s3 Lungs: clear Abdomen: soft, nontender, non distended. No hepatosplenomegaly. No bruits or masses. Good bowel sounds. Extremities: no cyanosis, clubbing, rash, edema Neuro: alert & orientedx3, cranial nerves grossly intact. moves all 4  extremities w/o difficulty. Affect pleasant  Telemetry: SR with v-pacing  Labs: Basic Metabolic Panel:  Recent Labs Lab 01/10/13 2204 01/11/13 0420 01/13/13 0957 01/15/13 0500  NA 143 144 139 137  K 4.9 4.5 5.0 3.9  CL 107 109 104 102  CO2 26 24 25 27   GLUCOSE 89 91 101* 87  BUN 32* 32* 38* 36*  CREATININE 1.93* 1.85* 2.13* 1.82*  CALCIUM 9.0 8.9 9.3 8.2*    Liver Function Tests:  Recent Labs Lab 01/11/13 0420  AST 27  ALT 25  ALKPHOS 85  BILITOT 1.7*  PROT 6.3  ALBUMIN 3.6   No results found for this basename: LIPASE, AMYLASE,  in the last 168 hours No results found for this basename: AMMONIA,  in the last 168 hours  CBC:  Recent Labs Lab 01/10/13 2204 01/11/13 0420 01/15/13 0500  WBC 7.7 7.8 6.7  NEUTROABS  --  4.7  --   HGB 14.4 14.4 13.3  HCT 42.4 42.4 39.1  MCV 88.3 88.3 87.5  PLT 209 191 162    Cardiac Enzymes:  Recent Labs Lab 01/11/13 0418 01/11/13 1158 01/11/13 1534  TROPONINI <0.30 <0.30 <0.30    BNP: BNP (last 3 results)  Recent Labs  11/30/12 0455 01/10/13 2204 01/11/13 1158  PROBNP 29987.0* 27226.0* 21360.0*    CBG: No results found for this basename: GLUCAP,  in the last 168 hours  Coagulation Studies: No results found for this basename: LABPROT, INR,  in the last  72 hours  Other results: EKG: SR with v pacing at 71  Imaging: Dg Fluoro Guide Cv Line-no Report  01/14/2013   CLINICAL DATA: cv line   FLOURO GUIDE CV LINE  Fluoroscopy was utilized by the requesting physician.  No radiographic  interpretation.      Medications:     Current Medications: . allopurinol  100 mg Oral QPM  . amiodarone  200 mg Oral BID  . atorvastatin  20 mg Oral Daily  . digoxin  0.125 mg Oral QPM  . donepezil  10 mg Oral Daily  . furosemide  80 mg Intravenous BID  . sodium chloride  3 mL Intravenous Q12H    Infusions: . sodium chloride    . sodium chloride    . heparin 14 Units/kg/hr (01/14/13 2249)  . milrinone 0.25  mcg/kg/min (01/14/13 2034)     Assessment:   1. A/C systolic HF with biventricular dysfunction and cardiogenic shock 2. Severe mitral regurgitation 3. Recurrent AF - now in SR after DC-CV 4. A/c renal failure, stage IV 5. H/o CVA - no residual  Plan/Discussion:    Much improved with milrinone support. Hemodynamics essentially normalized. Will need to discuss with [facility] and surgical team to decide next step of MVR/Maze vs VAD. Continue milrinone. Can change diuretics to po. I am hopeful renal function will continue to improve. Maintaining NSR continue amio and heparin.   The patient is critically ill with multiple organ systems failure and requires high complexity decision making for assessment and support, frequent evaluation and titration of therapies, application of advanced monitoring technologies and extensive interpretation of multiple databases.   Critical Care Time devoted to patient care services described in this note is 35 Minutes.   Jillianna Stanek,MD 8:17 AM Length of Stay: 5 Advanced Heart Failure Team Pager [phone] (M-F; 7a - 4p)  Please contact [facility] for night-coverage after hours (4p -7a ) and weekends on [email or web address]

## 2013-01-15 NOTE — Consult Note (Addendum)
PHARMACY CONSULT NOTE  Pharmacy Consult:  Heparin Indication: Transition from Eliquis [Apixaban] to Heparin infusion for atrial fibrillation   Allergies: Allergies  Allergen Reactions  . Penicillins     REACTION: Rash    Height/Weight: Height: 5\' 11"  (180.3 cm) Weight: 167 lb 1.7 oz (75.8 kg) IBW/kg (Calculated) : 75.3 Dosing weight 75.5 kg  Vital Signs: BP 114/70  Pulse 69  Temp(Src) 96.6 F (35.9 C) (Core (Comment))  Resp 16  Ht 5\' 11"  (1.803 m)  Wt 167 lb 1.7 oz (75.8 kg)  BMI 23.32 kg/m2  SpO2 94%  Active Problems: Principal Problem:   Acute on chronic systolic heart failure- (doesn't tolerate AF) Active Problems:   CAD- 40% RCA, 50% LAD 2004   Nonischemic cardiomyopathy,  EF 35-40% by echo 07/2012.  -- now 20-25% 12/2012   VENTRICULAR TACHYCARDIA- on Amiodarone   Atrial fibrillation, persistent, recurrent   CVA 2004   SLEEP DISORDER   ICD '04, upgraded to BiV ICD 2/10 and 11/13- ? 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   Assessment:  72 y.o.male transitioning from chronic Apixaban dosing to Heparin infusion during evaluation of patient for possible MVR +/- MAZE procedure.   Patient previously on  Apixaban 5 mg q 12 hours.  Last dose 10:12 01/14/13.  Apixaban has been discontinued   Am HL > 2.2, AM aPTT 141 sec 6 hours after heparin drip  started at 2249 last night at a rate of 1050 units/hr.  Per 3rd shift rx the heparin level was drawn incorrectly (near heparin site) but the aPTT should still be reliable.  HL is elevated due to the apixaban still in his system.  The aPTT is above goal of 66-102 seconds. No bleeding reported.  Hg down to 13.3 from 14.4 and pltc down to 162 from 191.  Creat down to 1.82 from 2.13.   Goal of Therapy:   Heparin level 0.3-0.7 units/ml after apixaban has worn off and aPTT  66-102 sec   Plan:  1. Hold heparin drip x 1 hour 2. Resume heparin drip at 800 units/hr 3. Check aPTT and HL 8 hrs after drip resumed and daily. 4. Daily HL, aPTT, CBC. Herby Abraham, Pharm.D. [phone] 01/15/2013 8:22 AM

## 2013-01-16 LAB — APTT
aPTT: 74 seconds — ABNORMAL HIGH (ref 24–37)
aPTT: 54 seconds — ABNORMAL HIGH (ref 24–37)

## 2013-01-16 LAB — BASIC METABOLIC PANEL
CO2: 25 mEq/L (ref 19–32)
Calcium: 8.1 mg/dL — ABNORMAL LOW (ref 8.4–10.5)
Chloride: 105 mEq/L (ref 96–112)
GFR calc non Af Amer: 38 mL/min — ABNORMAL LOW (ref >90)
Glucose, Bld: 79 mg/dL (ref 70–99)
Sodium: 140 mEq/L (ref 135–145)

## 2013-01-16 LAB — CARBOXYHEMOGLOBIN
Methemoglobin: 0.7 % (ref 0.0–1.5)
O2 Saturation: 74.1 %
Total hemoglobin: 13.6 g/dL (ref 13.5–18.0)

## 2013-01-16 LAB — HEPARIN LEVEL (UNFRACTIONATED)
Heparin Unfractionated: >2.2 IU/mL — ABNORMAL HIGH (ref 0.30–0.70)
Heparin Unfractionated: 0.99 IU/mL — ABNORMAL HIGH (ref 0.30–0.70)

## 2013-01-16 MED ORDER — POTASSIUM CHLORIDE CRYS ER 20 MEQ PO TBCR
20.0000 meq | EXTENDED_RELEASE_TABLET | Freq: Every day | ORAL | Status: DC
  Administered 2013-01-16: 20 meq via ORAL
  Filled 2013-01-16 (×2): qty 1

## 2013-01-16 MED ORDER — DIGOXIN 0.0625 MG HALF TABLET
0.0625 mg | ORAL_TABLET | Freq: Every evening | ORAL | Status: DC
  Administered 2013-01-16 – 2013-01-20 (×4): 0.0625 mg via ORAL
  Filled 2013-01-16 (×6): qty 1

## 2013-01-16 NOTE — Progress Notes (Signed)
Subjective:  Subjectively improved on Milrinone though no significant diuresis or wgt loss recorded.  Objective:  Vital Signs in the last 24 hours: Temp:  [96.6 F (35.9 C)-98.2 F (36.8 C)] 97.9 F (36.6 C) (09/28 0500) Pulse Rate:  [67-81] 74 (09/28 0500) Resp:  [12-24] 20 (09/28 0500) BP: (91-151)/(55-95) 112/66 mmHg (09/28 0500) SpO2:  [90 %-100 %] 93 % (09/28 0500) Weight:  [168 lb 6.9 oz (76.4 kg)] 168 lb 6.9 oz (76.4 kg) (09/28 0500)  Intake/Output from previous day:  Intake/Output Summary (Last 24 hours) at 01/16/13 0824 Last data filed at 01/16/13 0500  Gross per 24 hour  Intake 1921.45 ml  Output   1750 ml  Net 171.45 ml    Physical Exam: General appearance: alert, cooperative and no distress Lungs: decreased at bases Heart: regular rate and rhythm and 2/6 systolic murmur   Rate: 75  Rhythm: paced  Lab Results:  Recent Labs  01/15/13 0500  WBC 6.7  HGB 13.3  PLT 162    Recent Labs  01/15/13 0500 01/16/13 0412  NA 137 140  K 3.9 3.6  CL 102 105  CO2 27 25  GLUCOSE 87 79  BUN 36* 26*  CREATININE 1.82* 1.68*   No results found for this basename: TROPONINI, CK, MB,  in the last 72 hours No results found for this basename: INR,  in the last 72 hours  Imaging: Imaging results have been reviewed  Cardiac Studies:  Assessment/Plan:   Principal Problem:   Acute on chronic systolic heart failure- (doesn't tolerate AF) Active Problems:   Nonischemic cardiomyopathy,  EF 35-40% by echo 07/2012.  -- now 20-25% 12/2012   Atrial fibrillation, persistent, recurrent   ICD '04, upgraded to BiV ICD 2/10 and 11/13- ? non functioning atrial lead   CKD (chronic kidney disease) stage 3, GFR 30-59 ml/min   Mitral regurgitation   Chronic combined systolic and diastolic congestive heart failure   Tricuspid regurgitation   CAD- non obstructive 2004, and 2013 ([place])   VENTRICULAR TACHYCARDIA- on Amiodarone   CVA 2004   SLEEP DISORDER   Dementia due to  medical condition   Chronic anticoagulation- Eloquis    PLAN:  MD to see. Eloquis on hold- on Heparin.  Add low dose K+.  Corine Shelter PA-C [facility] [phone] 01/16/2013, 8:24 AM

## 2013-01-16 NOTE — Progress Notes (Addendum)
Subjective:  SOB is improving  Objective:  Temp:  [97 F (36.1 C)-98.2 F (36.8 C)] 97.5 F (36.4 C) (09/28 0900) Pulse Rate:  [67-98] 77 (09/28 0900) Resp:  [12-24] 19 (09/28 0900) BP: (91-157)/(55-95) 142/68 mmHg (09/28 0900) SpO2:  [90 %-98 %] 94 % (09/28 0900) Weight:  [168 lb 6.9 oz (76.4 kg)] 168 lb 6.9 oz (76.4 kg) (09/28 0500) Weight change: 1 lb 5.2 oz (0.6 kg)  Intake/Output from previous day: 09/27 0701 - 09/28 0700 In: 2077.7 [P.O.:980; I.V.:1097.7] Out: 2050 [Urine:2050]  Intake/Output from this shift: Total I/O In: 500.8 [P.O.:340; I.V.:160.8] Out: 300 [Urine:300]  Physical Exam: General appearance: alert and no distress Neck: no adenopathy, no carotid bruit, no JVD, supple, symmetrical, trachea midline and thyroid not enlarged, symmetric, no tenderness/mass/nodules Lungs: clear to auscultation bilaterally Heart: regular rate and rhythm, S1, S2 normal, no murmur, click, rub or gallop Extremities: extremities normal, atraumatic, no cyanosis or edema  Lab Results: Results for orders placed during the hospital encounter of 01/10/13 (from the past 48 hour(s))  CARBOXYHEMOGLOBIN     Status: Abnormal   Collection Time    01/14/13  7:40 PM      Result Value Range   Total hemoglobin 14.3  13.5 - 18.0 g/dL   O2 Saturation 16.1     Carboxyhemoglobin 1.7 (*) 0.5 - 1.5 %   Methemoglobin 1.4  0.0 - 1.5 %  CBC     Status: None   Collection Time    01/15/13  5:00 AM      Result Value Range   WBC 6.7  4.0 - 10.5 K/uL   RBC 4.47  4.22 - 5.81 MIL/uL   Hemoglobin 13.3  13.0 - 17.0 g/dL   HCT 09.6  04.5 - 40.9 %   MCV 87.5  78.0 - 100.0 fL   MCH 29.8  26.0 - 34.0 pg   MCHC 34.0  30.0 - 36.0 g/dL   RDW 81.1  91.4 - 78.2 %   Platelets 162  150 - 400 K/uL  BASIC METABOLIC PANEL     Status: Abnormal   Collection Time    01/15/13  5:00 AM      Result Value Range   Sodium 137  135 - 145 mEq/L   Potassium 3.9  3.5 - 5.1 mEq/L   Chloride 102  96 - 112 mEq/L   CO2  27  19 - 32 mEq/L   Glucose, Bld 87  70 - 99 mg/dL   BUN 36 (*) 6 - 23 mg/dL   Creatinine, Ser 9.56 (*) 0.50 - 1.35 mg/dL   Calcium 8.2 (*) 8.4 - 10.5 mg/dL   GFR calc non Af Amer 35 (*) >90 mL/min   GFR calc Af Amer 40 (*) >90 mL/min   Comment: (NOTE)     The eGFR has been calculated using the CKD EPI equation.     This calculation has not been validated in all clinical situations.     eGFR's persistently <90 mL/min signify possible Chronic Kidney     Disease.  HEPARIN LEVEL (UNFRACTIONATED)     Status: Abnormal   Collection Time    01/15/13  5:00 AM      Result Value Range   Heparin Unfractionated >2.20 (*) 0.30 - 0.70 IU/mL   Comment: RESULTS CONFIRMED BY MANUAL DILUTION                IF HEPARIN RESULTS ARE BELOW     EXPECTED VALUES, AND PATIENT  DOSAGE HAS BEEN CONFIRMED,     SUGGEST FOLLOW UP TESTING     OF ANTITHROMBIN III LEVELS.  APTT     Status: Abnormal   Collection Time    01/15/13  5:00 AM      Result Value Range   aPTT 141 (*) 24 - 37 seconds   Comment:            IF BASELINE aPTT IS ELEVATED,     SUGGEST PATIENT RISK ASSESSMENT     BE USED TO DETERMINE APPROPRIATE     ANTICOAGULANT THERAPY.  CARBOXYHEMOGLOBIN     Status: Abnormal   Collection Time    01/15/13  5:39 AM      Result Value Range   Total hemoglobin 13.2 (*) 13.5 - 18.0 g/dL   O2 Saturation 86.5     Carboxyhemoglobin 1.8 (*) 0.5 - 1.5 %   Methemoglobin 1.1  0.0 - 1.5 %  HEPARIN LEVEL (UNFRACTIONATED)     Status: Abnormal   Collection Time    01/15/13  5:49 PM      Result Value Range   Heparin Unfractionated >2.20 (*) 0.30 - 0.70 IU/mL   Comment:            IF HEPARIN RESULTS ARE BELOW     EXPECTED VALUES, AND PATIENT     DOSAGE HAS BEEN CONFIRMED,     SUGGEST FOLLOW UP TESTING     OF ANTITHROMBIN III LEVELS.     RESULTS CONFIRMED BY MANUAL DILUTION  APTT     Status: Abnormal   Collection Time    01/15/13  5:49 PM      Result Value Range   aPTT 51 (*) 24 - 37 seconds   Comment:             IF BASELINE aPTT IS ELEVATED,     SUGGEST PATIENT RISK ASSESSMENT     BE USED TO DETERMINE APPROPRIATE     ANTICOAGULANT THERAPY.  HEPARIN LEVEL (UNFRACTIONATED)     Status: Abnormal   Collection Time    01/16/13  4:12 AM      Result Value Range   Heparin Unfractionated >2.20 (*) 0.30 - 0.70 IU/mL   Comment:            IF HEPARIN RESULTS ARE BELOW     EXPECTED VALUES, AND PATIENT     DOSAGE HAS BEEN CONFIRMED,     SUGGEST FOLLOW UP TESTING     OF ANTITHROMBIN III LEVELS.     RESULTS CONFIRMED BY MANUAL DILUTION  BASIC METABOLIC PANEL     Status: Abnormal   Collection Time    01/16/13  4:12 AM      Result Value Range   Sodium 140  135 - 145 mEq/L   Potassium 3.6  3.5 - 5.1 mEq/L   Chloride 105  96 - 112 mEq/L   CO2 25  19 - 32 mEq/L   Glucose, Bld 79  70 - 99 mg/dL   BUN 26 (*) 6 - 23 mg/dL   Creatinine, Ser 7.84 (*) 0.50 - 1.35 mg/dL   Calcium 8.1 (*) 8.4 - 10.5 mg/dL   GFR calc non Af Amer 38 (*) >90 mL/min   GFR calc Af Amer 45 (*) >90 mL/min   Comment: (NOTE)     The eGFR has been calculated using the CKD EPI equation.     This calculation has not been validated in all clinical situations.     eGFR's persistently <90  mL/min signify possible Chronic Kidney     Disease.  APTT     Status: Abnormal   Collection Time    01/16/13  4:12 AM      Result Value Range   aPTT 74 (*) 24 - 37 seconds   Comment:            IF BASELINE aPTT IS ELEVATED,     SUGGEST PATIENT RISK ASSESSMENT     BE USED TO DETERMINE APPROPRIATE     ANTICOAGULANT THERAPY.  CARBOXYHEMOGLOBIN     Status: Abnormal   Collection Time    01/16/13  5:50 AM      Result Value Range   Total hemoglobin 13.6  13.5 - 18.0 g/dL   O2 Saturation 16.1     Carboxyhemoglobin 1.7 (*) 0.5 - 1.5 %   Methemoglobin 0.7  0.0 - 1.5 %    Imaging: Imaging results have been reviewed  Assessment/Plan:   1. Principal Problem: 2.   Acute on chronic systolic heart failure- (doesn't tolerate AF) 3. Active  Problems: 4.   CAD- non obstructive 2004, and 2013 [facility]) 5.   Nonischemic cardiomyopathy,  EF 35-40% by echo 07/2012.  -- now 20-25% 12/2012 6.   VENTRICULAR TACHYCARDIA- on Amiodarone 7.   Atrial fibrillation, persistent, recurrent 8.   CVA 2004 9.   SLEEP DISORDER 10.   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 non functioning atrial lead 11.   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   CKD (chronic kidney disease) stage 3, GFR 30-59 ml/min 13.   Mitral regurgitation 14.   Chronic anticoagulation- Eloquis 15.   Chronic combined systolic and diastolic congestive heart failure 16.   Tricuspid regurgitation 17.   Time Spent Directly with Patient:  25 minutes  Length of Stay:  LOS: 6 days   Pt with NISCM , EF 20%, severe MR prob secondary to tethering of MV leaflets. PAF currently in NSR. On milrinone with improved hemodynamics. C.O 7 liters and PCWP 22. On po diuretics. Discussed with Dr. Gala Romney and agree that he will need more definitive Rx, i.e. VAD vs MV repair +/- MAZE. Suggest conference with family, pt and Drs Gala Romney, Murlean Caller and Croitoru tomorrow.  Nathan Mckinney 01/16/2013, 9:56 AM

## 2013-01-16 NOTE — Consult Note (Signed)
PHARMACY CONSULT NOTE  Pharmacy Consult:  Heparin Indication: Transition from Eliquis [Apixaban] to Heparin infusion for atrial fibrillation   Allergies: Allergies  Allergen Reactions  . Penicillins     REACTION: Rash   Height/Weight: Height: 5\' 11"  (180.3 cm) Weight: 168 lb 6.9 oz (76.4 kg) IBW/kg (Calculated) : 75.3 Dosing weight 75.5 kg  Vital Signs: BP 166/106  Pulse 84  Temp(Src) 97.5 F (36.4 C) (Core (Comment))  Resp 24  Ht 5\' 11"  (1.803 m)  Wt 168 lb 6.9 oz (76.4 kg)  BMI 23.5 kg/m2  SpO2 95%  Labs:  Recent Labs  01/15/13 0500 01/15/13 1749 01/16/13 0412 01/16/13 1220  HGB 13.3  --   --   --   HCT 39.1  --   --   --   PLT 162  --   --   --   APTT 141* 51* 74* 54*  HEPARINUNFRC >2.20* >2.20* >2.20* 1.14*  CREATININE 1.82*  --  1.68*  --    Lab Results  Component Value Date   INR 2.2* 06/13/2008   INR 1.7* 06/12/2008   INR 2.1* 06/09/2008   Estimated Creatinine Clearance: 41.1 ml/min (by C-G formula based on Cr of 1.68).  Assessment: 72 yom transitioning from chronic Apixaban dosing to Heparin infusion during evaluation of patient for possible MVR +/- MAZE procedure. Patient previously on  Apixaban 5 mg q 12 hours.  Heparin level still elevated due to recent apixiban and aPTT= 54 on IV heparin rate of 950 units/hr.  This is below desired goal range of 66-102 sec.  Spoke with his nurse who reports no noted bleeding complications or issues with his IV.  Goal of Therapy:   Heparin level 0.3-0.7 units/ml after apixaban has worn off and aPTT  66-102 sec   Plan:  - Increase heparin to 1050 units/hr - Heparin level in 8 hours  Nadara Mustard, PharmD., MS Clinical Pharmacist Pager:  [phone] Thank you for allowing pharmacy to be part of this patients care team. 01/16/2013 2:23 PM

## 2013-01-16 NOTE — Progress Notes (Signed)
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 Alanda Amass since 2001. Admitted with AF, cardiogenic shock EF 20% with severe MR. Underwent DC-CV.   Swan placed. Started on milrinone. Hemodynamics and renal function much improved. Feels better. No orthopnea or PND. Still in NSR. Renal function continues to improve Cr now 1.68  Swan numbers:  CVP 6 PA 42/21 (25) PCWP 22 with prominent v waves Thermo CO/CI 7.0/3.6 Co-ox 74%  Objective:    Vital Signs:   Temp:  [97.2 F (36.2 C)-98.2 F (36.8 C)] 97.5 F (36.4 C) (09/28 0900) Pulse Rate:  [67-98] 77 (09/28 0900) Resp:  [12-24] 19 (09/28 0900) BP: (91-157)/(55-95) 142/68 mmHg (09/28 0900) SpO2:  [90 %-98 %] 94 % (09/28 0900) Weight:  [76.4 kg (168 lb 6.9 oz)] 76.4 kg (168 lb 6.9 oz) (09/28 0500) Last BM Date: 01/15/13  Weight change: Filed Weights   01/14/13 0505 01/15/13 0500 01/16/13 0500  Weight: 75.479 kg (166 lb 6.4 oz) 75.8 kg (167 lb 1.7 oz) 76.4 kg (168 lb 6.9 oz)    Intake/Output:   Intake/Output Summary (Last 24 hours) at 01/16/13 1116 Last data filed at 01/16/13 0900  Gross per 24 hour  Intake 2267.9 ml  Output   2050 ml  Net  217.9 ml     Physical Exam: General:  Comfortable. Lying flat.  HEENT: normal Neck: supple. JVP 7-8. Swan in [place]. Carotids 2+ bilat; no bruits. No lymphadenopathy or thryomegaly appreciated. Cor: PMI laterally displaced. Regular rate & rhythm.2/6 MR. + s3 Lungs: clear Abdomen: soft, nontender, non distended. No hepatosplenomegaly. No bruits or masses. Good bowel sounds. Extremities: no cyanosis, clubbing, rash, edema Neuro: alert & orientedx3, cranial nerves grossly  intact. moves all 4 extremities w/o difficulty. Affect pleasant  Telemetry: SR with v-pacing  Labs: Basic Metabolic Panel:  Recent Labs Lab 01/10/13 2204 01/11/13 0420 01/13/13 0957 01/15/13 0500 01/16/13 0412  NA 143 144 139 137 140  K 4.9 4.5 5.0 3.9 3.6  CL 107 109 104 102 105  CO2 26 24 25 27 25   GLUCOSE 89 91 101* 87 79  BUN 32* 32* 38* 36* 26*  CREATININE 1.93* 1.85* 2.13* 1.82* 1.68*  CALCIUM 9.0 8.9 9.3 8.2* 8.1*    Liver Function Tests:  Recent Labs Lab 01/11/13 0420  AST 27  ALT 25  ALKPHOS 85  BILITOT 1.7*  PROT 6.3  ALBUMIN 3.6   No results found for this basename: LIPASE, AMYLASE,  in the last 168 hours No results found for this basename: AMMONIA,  in the last 168 hours  CBC:  Recent Labs Lab 01/10/13 2204 01/11/13 0420 01/15/13 0500  WBC 7.7 7.8 6.7  NEUTROABS  --  4.7  --   HGB 14.4 14.4 13.3  HCT 42.4 42.4 39.1  MCV 88.3 88.3 87.5  PLT 209 191 162    Cardiac Enzymes:  Recent Labs Lab 01/11/13 0418 01/11/13 1158 01/11/13 1534  TROPONINI <0.30 <0.30 <0.30    BNP: BNP (last 3 results)  Recent Labs  11/30/12 0455 01/10/13 2204 01/11/13 1158  PROBNP 29987.0* 27226.0* 21360.0*    CBG: No results found for this basename: GLUCAP,  in the last 168 hours  Coagulation  Studies: No results found for this basename: LABPROT, INR,  in the last 72 hours  Other results: EKG: SR with v pacing at 71  Imaging: Dg Chest Port 1 View  01/15/2013   CLINICAL DATA:  Congestive heart failure. Swan-Ganz catheter placement.  EXAM: PORTABLE CHEST - 1 VIEW  COMPARISON:  01/10/2013  FINDINGS: Cardiomegaly remains stable. Increased bibasilar airspace disease is seen which is symmetric and suspicious for pulmonary edema. A new right IJ Swan-Ganz catheter seen with tip overlying the right lower lobe pulmonary artery. No pneumothorax identified. AICD remains in place.  IMPRESSION: Congestive heart failure with mild worsening of pulmonary edema.  Right  IJ Swan-Ganz catheter tip in right lower lobe pulmonary artery. No pneumothorax visualized.   Electronically Signed   By: Myles Rosenthal   On: 01/15/2013 08:21   Dg Fluoro Guide Cv Line-no Report  01/14/2013   CLINICAL DATA: cv line   FLOURO GUIDE CV LINE  Fluoroscopy was utilized by the requesting physician.  No radiographic  interpretation.      Medications:     Current Medications: . allopurinol  100 mg Oral QPM  . amiodarone  200 mg Oral BID  . atorvastatin  20 mg Oral Daily  . digoxin  0.125 mg Oral QPM  . donepezil  10 mg Oral Daily  . furosemide  40 mg Oral Daily  . potassium chloride  20 mEq Oral Daily  . sodium chloride  3 mL Intravenous Q12H    Infusions: . sodium chloride 250 mL (01/16/13 0800)  . sodium chloride 10 mL/hr at 01/16/13 0800  . heparin 950 Units/hr (01/16/13 0800)  . milrinone 0.25 mcg/kg/min (01/16/13 0800)     Assessment:   1. A/C systolic HF with biventricular dysfunction and cardiogenic shock 2. Severe mitral regurgitation 3. Recurrent AF - now in SR after DC-CV 4. A/c renal failure, stage IV - improving with milrinone 5. H/o CVA - no residual  Plan/Discussion:    Much improved with milrinone support. Hemodynamics essentially normalized except PCWP climbing again. Po lasix has been restarted. Can give IV lasix as needed.  Continue milrinone. Will need to discuss with [facility] and surgical team to decide next step of MVR/Maze vs VAD. Given poor LV function and tenuous renal function I favor VAD however he would much prefer an attempt at MVR. We will discuss with him and his family in am  Off b-blocker due to low output. No ACE due to renal failure.   Maintaining SR on amio. Continue anti-coagulation.   The patient is critically ill with multiple organ systems failure and requires high complexity decision making for assessment and support, frequent evaluation and titration of therapies, application of advanced monitoring technologies and extensive  interpretation of multiple databases.   Critical Care Time devoted to patient care services described in this note is 35 Minutes.   Dorthea Maina,MD 11:16 AM Length of Stay: 6 Advanced Heart Failure Team Pager [phone] (M-F; 7a - 4p)  Please contact [facility] for night-coverage after hours (4p -7a ) and weekends on [email or web address]

## 2013-01-16 NOTE — Progress Notes (Signed)
ANTICOAGULATION CONSULT NOTE - Follow Up Consult  Pharmacy Consult for heparin Indication: atrial fibrillation  Labs:  Recent Labs  01/15/13 0500  01/16/13 0412 01/16/13 1220 01/16/13 2100  HGB 13.3  --   --   --   --   HCT 39.1  --   --   --   --   PLT 162  --   --   --   --   APTT 141*  < > 74* 54* 76*  HEPARINUNFRC >2.20*  < > >2.20* 1.14* 0.99*  CREATININE 1.82*  --  1.68*  --   --   < > = values in this interval not displayed.  Assessment/Plan:  72yo male now therapeutic on heparin after rate increase.  Will continue gtt at current rate and confirm stable with am labs.  Vernard Gambles, PharmD, BCPS  01/16/2013,10:55 PM

## 2013-01-16 NOTE — Progress Notes (Signed)
ANTICOAGULATION CONSULT NOTE - Follow Up Consult  Pharmacy Consult for heparin Indication: atrial fibrillation  Labs:  Recent Labs  01/13/13 0957 01/15/13 0500 01/15/13 1749 01/16/13 0412  HGB  --  13.3  --   --   HCT  --  39.1  --   --   PLT  --  162  --   --   APTT  --  141* 51* 74*  HEPARINUNFRC  --  >2.20* >2.20*  --   CREATININE 2.13* 1.82*  --  1.68*    Assessment/Plan:  72yo male now therapeutic on heparin after rate increase.  Will continue gtt at current rate and confirm stable with additional level/aPTT.  Vernard Gambles, PharmD, BCPS  01/16/2013,7:07 AM

## 2013-01-17 ENCOUNTER — Other Ambulatory Visit: Payer: Self-pay

## 2013-01-17 ENCOUNTER — Encounter (HOSPITAL_COMMUNITY): Payer: Non-veteran care

## 2013-01-17 ENCOUNTER — Inpatient Hospital Stay (HOSPITAL_COMMUNITY): Payer: Medicare Other

## 2013-01-17 DIAGNOSIS — R570 Cardiogenic shock: Secondary | ICD-10-CM

## 2013-01-17 DIAGNOSIS — I079 Rheumatic tricuspid valve disease, unspecified: Secondary | ICD-10-CM

## 2013-01-17 DIAGNOSIS — I509 Heart failure, unspecified: Secondary | ICD-10-CM

## 2013-01-17 DIAGNOSIS — Z515 Encounter for palliative care: Secondary | ICD-10-CM

## 2013-01-17 DIAGNOSIS — R5381 Other malaise: Secondary | ICD-10-CM

## 2013-01-17 DIAGNOSIS — Z01810 Encounter for preprocedural cardiovascular examination: Secondary | ICD-10-CM

## 2013-01-17 DIAGNOSIS — I472 Ventricular tachycardia: Secondary | ICD-10-CM

## 2013-01-17 DIAGNOSIS — R531 Weakness: Secondary | ICD-10-CM

## 2013-01-17 LAB — URINALYSIS, ROUTINE W REFLEX MICROSCOPIC
Ketones, ur: 15 mg/dL — AB
Nitrite: NEGATIVE
Protein, ur: 30 mg/dL — AB
pH: 5.5 (ref 5.0–8.0)

## 2013-01-17 LAB — BASIC METABOLIC PANEL
BUN: 15 mg/dL (ref 6–23)
CO2: 27 mEq/L (ref 19–32)
Chloride: 101 mEq/L (ref 96–112)
Creatinine, Ser: 1.45 mg/dL — ABNORMAL HIGH (ref 0.50–1.35)
GFR calc Af Amer: 53 mL/min — ABNORMAL LOW (ref >90)
Glucose, Bld: 89 mg/dL (ref 70–99)
Potassium: 4.1 mEq/L (ref 3.5–5.1)
Sodium: 138 mEq/L (ref 135–145)

## 2013-01-17 LAB — ANTITHROMBIN III: AntiThromb III Func: 70 % — ABNORMAL LOW (ref 75–120)

## 2013-01-17 LAB — TYPE AND SCREEN: ABO/RH(D): B POS

## 2013-01-17 LAB — URINE MICROSCOPIC-ADD ON

## 2013-01-17 LAB — ABO/RH: ABO/RH(D): B POS

## 2013-01-17 LAB — PSA: PSA: 1.62 ng/mL (ref <=4.00)

## 2013-01-17 LAB — CARBOXYHEMOGLOBIN: Carboxyhemoglobin: 1.8 % — ABNORMAL HIGH (ref 0.5–1.5)

## 2013-01-17 LAB — HEPARIN LEVEL (UNFRACTIONATED): Heparin Unfractionated: 1 IU/mL — ABNORMAL HIGH (ref 0.30–0.70)

## 2013-01-17 MED ORDER — SODIUM CHLORIDE 0.9 % IJ SOLN: 10.0000 mL | INTRAMUSCULAR | Status: DC | PRN

## 2013-01-17 MED ORDER — SODIUM CHLORIDE 0.9 % IJ SOLN
10.0000 mL | Freq: Two times a day (BID) | INTRAMUSCULAR | Status: DC
  Administered 2013-01-17 – 2013-01-20 (×4): 10 mL

## 2013-01-17 NOTE — Consult Note (Signed)
PHARMACY CONSULT NOTE  Pharmacy Consult:  Heparin Indication: Transition from Eliquis [Apixaban] to Heparin infusion for atrial fibrillation   Allergies: Allergies  Allergen Reactions  . Penicillins     REACTION: Rash   Height/Weight: Height: 5\' 11"  (180.3 cm) Weight: 165 lb 2 oz (74.9 kg) IBW/kg (Calculated) : 75.3 Dosing weight 75.5 kg  Vital Signs: BP 120/69  Pulse 89  Temp(Src) 99 F (37.2 C) (Core (Comment))  Resp 25  Ht 5\' 11"  (1.803 m)  Wt 165 lb 2 oz (74.9 kg)  BMI 23.04 kg/m2  SpO2 94%  Labs:  Recent Labs  01/15/13 0500  01/16/13 0412 01/16/13 1220 01/16/13 2100 01/17/13 0725  HGB 13.3  --   --   --   --   --   HCT 39.1  --   --   --   --   --   PLT 162  --   --   --   --   --   APTT 141*  < > 74* 54* 76* 100*  HEPARINUNFRC >2.20*  < > >2.20* 1.14* 0.99* 1.00*  CREATININE 1.82*  --  1.68*  --   --  1.45*  < > = values in this interval not displayed. Lab Results  Component Value Date   INR 2.2* 06/13/2008   INR 1.7* 06/12/2008   INR 2.1* 06/09/2008   Estimated Creatinine Clearance: 47.4 ml/min (by C-G formula based on Cr of 1.45).  Assessment: 72 yom transitioning from chronic Apixaban dosing to Heparin infusion during evaluation of patient for possible MVR +/- MAZE procedure. Patient previously on  Apixaban 5 mg q 12 hours.  Heparin level still elevated due to recent apixiban so heparin dosing will be managed by aPTT.  Current  heparin rate of 1050 units/hr with aPTT 100 at goal and drawn from opposite arm as heparin infusion.  CBC stable but he c/o some bleeding with urination overnight.   Goal of Therapy:   Heparin level 0.3-0.7 units/ml after apixaban has worn off and aPTT  66-102 sec  Monitor pltc   Plan:  - Decrease heparin to 1000 units/hr Daily CBC, aPTT  Leota Sauers Pharm.D. CPP, BCPS Clinical Pharmacist [phone] 01/17/2013 9:40 AM

## 2013-01-17 NOTE — Progress Notes (Signed)
CARDIAC REHAB PHASE I   PRE:  Rate/Rhythm: paced 87  BP:  Supine: 90/51  Sitting:   Standing:    SaO2: 91%RA  MODE:  Ambulation: 250 ft   POST:  Rate/Rhythm: 101 paced   BP:  Supine: 126/70  Sitting:   Standing:    SaO2: 93%RA 1329-1400 Pt stated he had not walked in a week. Pt resting in bed trying to sleep. Assured pt I would put back to bed after walk. Pt walked 250 ft on RA with rolling walker and asst x 1 with steady gait. Tired by end of walk but he was happy to be up. To bed after walk. No dizziness. Pt c/o generalized weakness.   Luetta Nutting, RN BSN  01/17/2013 1:55 PM

## 2013-01-17 NOTE — Progress Notes (Signed)
Peripherally Inserted Central Catheter/Midline Placement  The IV Nurse has discussed with the patient and/or persons authorized to consent for the patient, the purpose of this procedure and the potential benefits and risks involved with this procedure.  The benefits include less needle sticks, lab draws from the catheter and patient may be discharged home with the catheter.  Risks include, but not limited to, infection, bleeding, blood clot (thrombus formation), and puncture of an artery; nerve damage and irregular heat beat.  Alternatives to this procedure were also discussed.  PICC/Midline Placement Documentation        Nathan Mckinney 01/17/2013, 3:52 PM

## 2013-01-17 NOTE — Progress Notes (Addendum)
VASCULAR LAB PRELIMINARY  PRELIMINARY  PRELIMINARY  PRELIMINARY  Pre-op Cardiac Surgery  Carotid Findings:  Bilateral:  1-39% ICA stenosis.  Vertebral artery flow is antegrade.      Lower  Extremity Right Left  Brachial PICC 114 Triphasic   Dorsalis Pedis Triphasic  Triphasic   Anterior Tibial    Posterior Tibial Triphasic  Triphasic   Ankle/Brachial Indices 1.25 1.3     Daila Elbert, RVT 01/17/2013, 1:27 PM

## 2013-01-17 NOTE — Progress Notes (Addendum)
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 Alanda Amass since 2001. Admitted with AF, cardiogenic shock EF 20% with severe MR. Underwent DC-CV.   Swan placed. Started on milrinone. Hemodynamics and renal function much improved. Feels better. No orthopnea or PND. Still in NSR on amiodarone. Renal function continues to improve Cr now 1.45  Swan numbers:  CVP 1 PA  22/10 (15) PCWP 4 Thermo CO/CI  pending Co-ox 76%  Feels much better. No orthopnea or PND. Hasn't gotten out of bed over the weekend.  Small amount of hematuria this am.  Objective:    Vital Signs:   Temp:  [97.5 F (36.4 C)-99.1 F (37.3 C)] 99 F (37.2 C) (09/29 0600) Pulse Rate:  [72-121] 95 (09/29 0600) Resp:  [12-29] 24 (09/29 0600) BP: (112-168)/(67-121) 145/92 mmHg (09/29 0600) SpO2:  [92 %-100 %] 93 % (09/29 0600) Weight:  [74.9 kg (165 lb 2 oz)] 74.9 kg (165 lb 2 oz) (09/29 0500) Last BM Date: 01/15/13  Weight change: Filed Weights   01/15/13 0500 01/16/13 0500 01/17/13 0500  Weight: 75.8 kg (167 lb 1.7 oz) 76.4 kg (168 lb 6.9 oz) 74.9 kg (165 lb 2 oz)    Intake/Output:   Intake/Output Summary (Last 24 hours) at 01/17/13 0850 Last data filed at 01/17/13 0600  Gross per 24 hour  Intake 1969.9 ml  Output   2075 ml  Net -105.1 ml     Physical Exam: General:  Comfortable. Lying flat.  HEENT: normal Neck: supple. JVP flat. Swan in [place]. Carotids 2+ bilat; no bruits. No lymphadenopathy or thryomegaly appreciated. Cor: PMI laterally displaced. Regular rate & rhythm.2/6 MR. + s3 Lungs: clear Abdomen: soft, nontender, non distended. No hepatosplenomegaly. No bruits or masses. Good bowel  sounds. Extremities: no cyanosis, clubbing, rash, edema Neuro: alert & orientedx3, cranial nerves grossly intact. moves all 4 extremities w/o difficulty. Affect pleasant  Telemetry: SR with v-pacing  Labs: Basic Metabolic Panel:  Recent Labs Lab 01/11/13 0420 01/13/13 0957 01/15/13 0500 01/16/13 0412 01/17/13 0725  NA 144 139 137 140 138  K 4.5 5.0 3.9 3.6 4.1  CL 109 104 102 105 101  CO2 24 25 27 25 27   GLUCOSE 91 101* 87 79 89  BUN 32* 38* 36* 26* 15  CREATININE 1.85* 2.13* 1.82* 1.68* 1.45*  CALCIUM 8.9 9.3 8.2* 8.1* 8.6    Liver Function Tests:  Recent Labs Lab 01/11/13 0420  AST 27  ALT 25  ALKPHOS 85  BILITOT 1.7*  PROT 6.3  ALBUMIN 3.6   No results found for this basename: LIPASE, AMYLASE,  in the last 168 hours No results found for this basename: AMMONIA,  in the last 168 hours  CBC:  Recent Labs Lab 01/10/13 2204 01/11/13 0420 01/15/13 0500  WBC 7.7 7.8 6.7  NEUTROABS  --  4.7  --   HGB 14.4 14.4 13.3  HCT 42.4 42.4 39.1  MCV 88.3 88.3 87.5  PLT 209 191 162    Cardiac Enzymes:  Recent Labs Lab 01/11/13 0418 01/11/13 1158 01/11/13 1534  TROPONINI <0.30 <0.30 <0.30    BNP: BNP (last 3 results)  Recent Labs  11/30/12 0455 01/10/13 2204 01/11/13 1158  PROBNP  29987.0* 27226.0* 21360.0*    CBG: No results found for this basename: GLUCAP,  in the last 168 hours  Coagulation Studies: No results found for this basename: LABPROT, INR,  in the last 72 hours  Other results: EKG: SR with v pacing at 71  Imaging: No results found.   Medications:     Current Medications: . allopurinol  100 mg Oral QPM  . amiodarone  200 mg Oral BID  . atorvastatin  20 mg Oral Daily  . digoxin  0.0625 mg Oral QPM  . donepezil  10 mg Oral Daily  . furosemide  40 mg Oral Daily  . potassium chloride  20 mEq Oral Daily  . sodium chloride  3 mL Intravenous Q12H    Infusions: . sodium chloride 250 mL (01/16/13 0800)  . sodium chloride 10  mL/hr at 01/16/13 0800  . heparin 1,050 Units/hr (01/17/13 0308)  . milrinone 0.25 mcg/kg/min (01/17/13 0542)     Assessment:   1. A/C systolic HF with biventricular dysfunction and cardiogenic shock 2. Severe mitral regurgitation 3. Recurrent AF - now in SR after DC-CV 4. A/c renal failure, stage IV - improving with milrinone 5. 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 Hematuria  Plan/Discussion:    Much improved with milrinone support. Hemodynamics and renal function essentially normalized. Volume status low can stop lasix.  Off b-blocker due to low output. No ACE due to renal failure.   Maintaining SR on amio. Continue anti-coagulation.   Long talk with patient and family about options including: 1) Medical therapy 2) Home milrinone 3) MVR and Maze 4) VAD  Given long-standing severe LV dysfunction, I think best option is VAD and they are in favor of that as well. Will d/w Drs. Cornelius Moras and [employer]. Will also have VAD coordinator and VAD patient see him today. Will need pre-op VAD testing to see if he qualifies.   Can hold lasix today. Remove Swan and place 2-lumen PICC. Ambulate with cardiac rehab.   For hematuria check UA, urine cytology and PSA.   The patient is critically ill with multiple organ systems failure and requires high complexity decision making for assessment and support, frequent evaluation and titration of therapies, application of advanced monitoring technologies and extensive interpretation of multiple databases.   Critical Care Time devoted to patient care services described in this note is 45 Minutes.   Nathan Montero,MD 8:50 AM Length of Stay: 7 Advanced Heart Failure Team Pager [phone] (M-F; 7a - 4p)  Please contact [facility] for night-coverage after hours (4p -7a ) and weekends on [email or web address]

## 2013-01-17 NOTE — Consult Note (Signed)
I have reviewed this nurse practitioners consult and support her work with this family and patient.  Cristhian Vanhook L. Ladona Ridgel, MD MBA The Palliative Medicine Team at [facility] Phone: [phone] Pager: [phone]

## 2013-01-17 NOTE — Consult Note (Signed)
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 Alanda Amass since 2001. Admitted with AF, cardiogenic shock EF 20% with severe MR. Swan placed. Started on milrinone. Hemodynamics and renal function much improved.  Patient reports drastic decline physically and functionally over the past six months Patient and family now in consideration for LVAD procedure.  Today's meeting for conversation of preparedness plan.   This NP Lorinda Creed reviewed medical records, received report from team, assessed the patient and then meet at the bedside with  his wife June Palla # [phone] and step-son Amador Cunas [phone] to discuss advanced directives and a preparedness plan in light of scheduled LVAD implantation tomorrow morning.  This is destination therapy.  A detailed discussion was had today regarding the concept of a preparedness plan as it relates to LVAD procedure as destination therapy.    Patient and family were comfortable talking about the "what ifs" and the importance of today's conversation so everyone can have all the information to be full participants and to understand the patient's basic beliefs and wishes as it relates  to healthcare.  Concepts specific to future possibilities of -long term ventilation -artificial feeding and hydration -long term antibiotic use -dialysis -psychological adjustments -need to terminate the pump- Other chronic or terminal disease unrelated to cardiac LVAD  Patient was able to verbalize to his family  the importance of quality of life.  He is hopeful  That LVAD procedure will increase his quantity and most importantly quality of life.  At this time patient is open to all available medical interventions to prolong life and the success of the LVAD therapy. He shared and verbalized an understanding that he and his family would know when the burdens of treatment would outweigh the benefits and trust in his family's support at that time.  Patient and family were encouraged to continue conversation into the future as it is vital for the patient centered care.  Chaplain services consulted.  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 Alanda Amass since 2001. Admitted with AF, cardiogenic shock EF 20% with severe MR. Swan placed. Started on milrinone. Hemodynamics and renal function much improved.                Patient and family now in consideration for LVAD procedure.   ROS:   weakness and fatigue, "drastic" physical decline over the past six months    PMH:  Past Medical History  Diagnosis Date  . Hypertension   . Chronic tension headaches   . Tinea cruris   . Tinea unguium   . Hernia, hiatal   . CVA (cerebral infarction)   . Systolic heart failure     EF 69-62% in 08/2012 (Echo at [facility], [place])  . Bradycardia   . Ventricular tachycardia   . Syncope   .  CAD (coronary artery disease)     h/o MI in 2001, admitted for possible STEMI at [facility] in 08/2012 - Cardiac cath at [facility] in 08/2012: Nonobstructive coronary artery disease  . Sleep disorder   . Atrial fibrillation     On Eliquis, new onset in 08/2012 - successful cardioversion to sinus rhythm  . Automatic implantable cardioverter-defibrillator in situ   . Pacemaker     ICD/PACEMAKER   . Arthritis     HANDS  . CHF (congestive heart failure)   . Anginal pain    . Depression   . Shortness of breath   . Pneumonia   . Heart murmur   . Stroke   . Mitral regurgitation   . Acute on chronic systolic heart failure-  10/22/2012  . Chronic anticoagulation- Eloquis   . CKD (chronic kidney disease) stage 3, GFR 30-59 ml/min   . 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 Atrial fibrillation, persistent, recurrent   . Chronic combined systolic and diastolic congestive heart failure   . Non-ischemic cardiomyopathy 1997  . Tricuspid regurgitation      PSH: Past Surgical History  Procedure Laterality Date  . Vasectomy    . Icd implantation  06/12/08    Medtronic biventricular ICD, remote - no, Dr. Ladona Ridgel  . Cardiac catheterization  02/03/03    Dr. Jenne Campus   . Insert / replace / remove pacemaker      pacer/ICD   I have reviewed the FH and SH and  If appropriate update it with new information. Allergies  Allergen Reactions  . Penicillins     REACTION: Rash   Scheduled Meds: . allopurinol  100 mg Oral QPM  . amiodarone  200 mg Oral BID  . atorvastatin  20 mg Oral Daily  . digoxin  0.0625 mg Oral QPM  . donepezil  10 mg Oral Daily  . sodium chloride  3 mL Intravenous Q12H   Continuous Infusions: . sodium chloride 250 mL (01/16/13 0800)  . sodium chloride 10 mL/hr at 01/16/13 0800  . heparin 1,000 Units/hr (01/17/13 1000)  . milrinone 0.25 mcg/kg/min (01/17/13 0542)   PRN Meds:.sodium chloride, acetaminophen, hydrocortisone cream, ondansetron (ZOFRAN) IV, sodium chloride    BP 99/57  Pulse 86  Temp(Src) 99 F (37.2 C) (Core (Comment))  Resp 24  Ht 5\' 11"  (1.803 m)  Wt 74.9 kg (165 lb 2 oz)  BMI 23.04 kg/m2  SpO2 95%   PPS:30 %   Intake/Output Summary (Last 24 hours) at 01/17/13 1215 Last data filed at 01/17/13 0600  Gross per 24 hour  Intake 1269.1 ml  Output   1675 ml  Net -405.9 ml    Physical Exam:  General: ill appearing, engaged in conversation, NAD HEENT:  Mm, no exudate noted, no JVD Chest:   CTA CVS:  RRR,  noted murmur Abdomen:soft NT+ BS Ext: without edema Neuro: alert and oriented X3 Psych: denies depressed mood, engaged , sense of humor intact  Labs: CBC    Component Value Date/Time   WBC 6.7 01/15/2013 0500   RBC 4.47 01/15/2013 0500   HGB 13.3 01/15/2013 0500   HCT 39.1 01/15/2013 0500   PLT 162 01/15/2013 0500   MCV 87.5 01/15/2013 0500   MCH 29.8 01/15/2013 0500   MCHC 34.0 01/15/2013 0500   RDW 15.4 01/15/2013 0500   LYMPHSABS 1.7 01/11/2013 0420   MONOABS 0.9 01/11/2013 0420   EOSABS 0.5 01/11/2013 0420   BASOSABS 0.0 01/11/2013 0420    BMET  Component Value Date/Time   NA 138 01/17/2013 0725   K 4.1 01/17/2013 0725   CL 101 01/17/2013 0725   CO2 27 01/17/2013 0725   GLUCOSE 89 01/17/2013 0725   BUN 15 01/17/2013 0725   CREATININE 1.45* 01/17/2013 0725   CREATININE 1.65* 10/29/2012 1646   CALCIUM 8.6 01/17/2013 0725   GFRNONAA 46* 01/17/2013 0725   GFRAA 53* 01/17/2013 0725    CMP     Component Value Date/Time   NA 138 01/17/2013 0725   K 4.1 01/17/2013 0725   CL 101 01/17/2013 0725   CO2 27 01/17/2013 0725   GLUCOSE 89 01/17/2013 0725   BUN 15 01/17/2013 0725   CREATININE 1.45* 01/17/2013 0725   CREATININE 1.65* 10/29/2012 1646   CALCIUM 8.6 01/17/2013 0725   PROT 6.3 01/11/2013 0420   ALBUMIN 3.6 01/11/2013 0420   AST 27 01/11/2013 0420   ALT 25 01/11/2013 0420   ALKPHOS 85 01/11/2013 0420   BILITOT 1.7* 01/11/2013 0420   GFRNONAA 46* 01/17/2013 0725   GFRAA 53* 01/17/2013 0725      Time In Time Out Total Time Spent with Patient Total Overall Time  1115 1215 70 min 75 min     Greater than 50%  of this time was spent counseling and coordinating care related to the above assessment and plan.  PMT will continue to support holistically  Lorinda Creed NP  Palliative Medicine Team Team Phone # [phone] Pager [phone]

## 2013-01-17 NOTE — Progress Notes (Signed)
1145 Palliative in with pt at this time. Will return later. Luetta Nutting RNBSN

## 2013-01-17 NOTE — Progress Notes (Signed)
Small amount of bleeding noted from penis while trying to void in urinal. Pharmacy was notified regarding heparin infusing. Waiting on labs to be drawn this morning by phlebotomy to see if heparin needs to be adjusted. Will continue to monitor for increased signs of bleeding.

## 2013-01-17 NOTE — Progress Notes (Signed)
[facility]  DAILY PROGRESS NOTE   Subjective:  Feels better - less dyspnea at rest. Substantial weight reduction despite no net diuresis, unsure where discrepancy lies  Objective:  Temp:  [97.5 F (36.4 C)-99.1 F (37.3 C)] 99 F (37.2 C) (09/29 0903) Pulse Rate:  [72-121] 89 (09/29 0903) Resp:  [12-29] 25 (09/29 0903) BP: (112-168)/(67-121) 120/69 mmHg (09/29 0900) SpO2:  [92 %-100 %] 94 % (09/29 0903) Weight:  [165 lb 2 oz (74.9 kg)] 165 lb 2 oz (74.9 kg) (09/29 0500) Weight change: -3 lb 4.9 oz (-1.5 kg)  Intake/Output from previous day: 09/28 0701 - 09/29 0700 In: 2190.5 [P.O.:1320; I.V.:870.5] Out: 2375 [Urine:2375]  Intake/Output from this shift:    Medications: Current Facility-Administered Medications  Medication Dose Route Frequency Provider Last Rate Last Dose  . 0.9 %  sodium chloride infusion  250 mL Intravenous Continuous Marykay Lex, MD 15 mL/hr at 01/16/13 0800 250 mL at 01/16/13 0800  . 0.9 %  sodium chloride infusion   Intravenous Continuous PRN Dolores Patty, MD 10 mL/hr at 01/16/13 0800    . acetaminophen (TYLENOL) tablet 650 mg  650 mg Oral Q4H PRN Lynden Oxford, MD   650 mg at 01/17/13 1002  . allopurinol (ZYLOPRIM) tablet 100 mg  100 mg Oral QPM Lynden Oxford, MD   100 mg at 01/16/13 1854  . amiodarone (PACERONE) tablet 200 mg  200 mg Oral BID Lynden Oxford, MD   200 mg at 01/17/13 1002  . atorvastatin (LIPITOR) tablet 20 mg  20 mg Oral Daily Lynden Oxford, MD   20 mg at 01/17/13 1002  . digoxin (LANOXIN) tablet 0.0625 mg  0.0625 mg Oral QPM Jivan Symanski, MD   0.0625 mg at 01/16/13 1854  . donepezil (ARICEPT) tablet 10 mg  10 mg Oral Daily Lynden Oxford, MD   10 mg at 01/17/13 1002  . heparin ADULT infusion 100 units/mL (25000 units/250 mL)  1,000 Units/hr Intravenous Continuous Dolores Patty, MD 10.5 mL/hr at 01/17/13 0308 1,050 Units/hr at 01/17/13 0308  . hydrocortisone cream 1 % 1 application  1 application  Topical TID PRN Marykay Lex, MD      . milrinone [facility]) infusion 200 mcg/mL (0.2 mg/ml)  0.25 mcg/kg/min Intravenous Continuous Dolores Patty, MD 5.7 mL/hr at 01/17/13 0542 0.25 mcg/kg/min at 01/17/13 0542  . ondansetron (ZOFRAN) injection 4 mg  4 mg Intravenous Q6H PRN Lynden Oxford, MD   4 mg at 01/12/13 2137  . sodium chloride 0.9 % injection 3 mL  3 mL Intravenous Q12H Marykay Lex, MD   3 mL at 01/16/13 1000  . sodium chloride 0.9 % injection 3 mL  3 mL Intravenous PRN Marykay Lex, MD        Physical Exam: Note PAP 27/14 General appearance: alert, cooperative, no distress and even when fully supine Neck: no adenopathy, no carotid bruit, no JVD, supple, symmetrical, trachea midline and thyroid not enlarged, symmetric, no tenderness/mass/nodules Lungs: clear to auscultation bilaterally Heart: regular rate and rhythm, S1, S2 normal and systolic murmur: holosystolic 2/6 blowing at apex, radiating throughout the precordium Abdomen: soft, non-tender; bowel sounds normal; no masses,  no organomegaly Extremities: extremities normal, atraumatic, no cyanosis or edema Pulses: 2+ and symmetric Skin: Skin color, texture, turgor normal. No rashes or lesions Neurologic: Alert and oriented X 3, normal strength and tone. Normal symmetric reflexes. Normal coordination and gait  Lab Results: Results for orders placed during the hospital encounter of  01/10/13 (from the past 48 hour(s))  HEPARIN LEVEL (UNFRACTIONATED)     Status: Abnormal   Collection Time    01/15/13  5:49 PM      Result Value Range   Heparin Unfractionated >2.20 (*) 0.30 - 0.70 IU/mL   Comment:            IF HEPARIN RESULTS ARE BELOW     EXPECTED VALUES, AND PATIENT     DOSAGE HAS BEEN CONFIRMED,     SUGGEST FOLLOW UP TESTING     OF ANTITHROMBIN III LEVELS.     RESULTS CONFIRMED BY MANUAL DILUTION  APTT     Status: Abnormal   Collection Time    01/15/13  5:49 PM      Result Value Range   aPTT 51 (*) 24 - 37  seconds   Comment:            IF BASELINE aPTT IS ELEVATED,     SUGGEST PATIENT RISK ASSESSMENT     BE USED TO DETERMINE APPROPRIATE     ANTICOAGULANT THERAPY.  HEPARIN LEVEL (UNFRACTIONATED)     Status: Abnormal   Collection Time    01/16/13  4:12 AM      Result Value Range   Heparin Unfractionated >2.20 (*) 0.30 - 0.70 IU/mL   Comment:            IF HEPARIN RESULTS ARE BELOW     EXPECTED VALUES, AND PATIENT     DOSAGE HAS BEEN CONFIRMED,     SUGGEST FOLLOW UP TESTING     OF ANTITHROMBIN III LEVELS.     RESULTS CONFIRMED BY MANUAL DILUTION  BASIC METABOLIC PANEL     Status: Abnormal   Collection Time    01/16/13  4:12 AM      Result Value Range   Sodium 140  135 - 145 mEq/L   Potassium 3.6  3.5 - 5.1 mEq/L   Chloride 105  96 - 112 mEq/L   CO2 25  19 - 32 mEq/L   Glucose, Bld 79  70 - 99 mg/dL   BUN 26 (*) 6 - 23 mg/dL   Creatinine, Ser 1.61 (*) 0.50 - 1.35 mg/dL   Calcium 8.1 (*) 8.4 - 10.5 mg/dL   GFR calc non Af Amer 38 (*) >90 mL/min   GFR calc Af Amer 45 (*) >90 mL/min   Comment: (NOTE)     The eGFR has been calculated using the CKD EPI equation.     This calculation has not been validated in all clinical situations.     eGFR's persistently <90 mL/min signify possible Chronic Kidney     Disease.  APTT     Status: Abnormal   Collection Time    01/16/13  4:12 AM      Result Value Range   aPTT 74 (*) 24 - 37 seconds   Comment:            IF BASELINE aPTT IS ELEVATED,     SUGGEST PATIENT RISK ASSESSMENT     BE USED TO DETERMINE APPROPRIATE     ANTICOAGULANT THERAPY.  CARBOXYHEMOGLOBIN     Status: Abnormal   Collection Time    01/16/13  5:50 AM      Result Value Range   Total hemoglobin 13.6  13.5 - 18.0 g/dL   O2 Saturation 09.6     Carboxyhemoglobin 1.7 (*) 0.5 - 1.5 %   Methemoglobin 0.7  0.0 - 1.5 %  HEPARIN LEVEL (UNFRACTIONATED)  Status: Abnormal   Collection Time    01/16/13 12:20 PM      Result Value Range   Heparin Unfractionated 1.14 (*) 0.30 -  0.70 IU/mL   Comment:            IF HEPARIN RESULTS ARE BELOW     EXPECTED VALUES, AND PATIENT     DOSAGE HAS BEEN CONFIRMED,     SUGGEST FOLLOW UP TESTING     OF ANTITHROMBIN III LEVELS.     RESULTS CONFIRMED BY MANUAL DILUTION  APTT     Status: Abnormal   Collection Time    01/16/13 12:20 PM      Result Value Range   aPTT 54 (*) 24 - 37 seconds   Comment:            IF BASELINE aPTT IS ELEVATED,     SUGGEST PATIENT RISK ASSESSMENT     BE USED TO DETERMINE APPROPRIATE     ANTICOAGULANT THERAPY.  HEPARIN LEVEL (UNFRACTIONATED)     Status: Abnormal   Collection Time    01/16/13  9:00 PM      Result Value Range   Heparin Unfractionated 0.99 (*) 0.30 - 0.70 IU/mL   Comment:            IF HEPARIN RESULTS ARE BELOW     EXPECTED VALUES, AND PATIENT     DOSAGE HAS BEEN CONFIRMED,     SUGGEST FOLLOW UP TESTING     OF ANTITHROMBIN III LEVELS.  APTT     Status: Abnormal   Collection Time    01/16/13  9:00 PM      Result Value Range   aPTT 76 (*) 24 - 37 seconds   Comment:            IF BASELINE aPTT IS ELEVATED,     SUGGEST PATIENT RISK ASSESSMENT     BE USED TO DETERMINE APPROPRIATE     ANTICOAGULANT THERAPY.  CARBOXYHEMOGLOBIN     Status: Abnormal   Collection Time    01/17/13  5:42 AM      Result Value Range   Total hemoglobin 13.1 (*) 13.5 - 18.0 g/dL   O2 Saturation 29.5     Carboxyhemoglobin 1.8 (*) 0.5 - 1.5 %   Methemoglobin 0.6  0.0 - 1.5 %  HEPARIN LEVEL (UNFRACTIONATED)     Status: Abnormal   Collection Time    01/17/13  7:25 AM      Result Value Range   Heparin Unfractionated 1.00 (*) 0.30 - 0.70 IU/mL   Comment:            IF HEPARIN RESULTS ARE BELOW     EXPECTED VALUES, AND PATIENT     DOSAGE HAS BEEN CONFIRMED,     SUGGEST FOLLOW UP TESTING     OF ANTITHROMBIN III LEVELS.  BASIC METABOLIC PANEL     Status: Abnormal   Collection Time    01/17/13  7:25 AM      Result Value Range   Sodium 138  135 - 145 mEq/L   Potassium 4.1  3.5 - 5.1 mEq/L    Chloride 101  96 - 112 mEq/L   CO2 27  19 - 32 mEq/L   Glucose, Bld 89  70 - 99 mg/dL   BUN 15  6 - 23 mg/dL   Creatinine, Ser 6.21 (*) 0.50 - 1.35 mg/dL   Calcium 8.6  8.4 - 30.8 mg/dL   GFR calc non Af Amer 46 (*) >  90 mL/min   GFR calc Af Amer 53 (*) >90 mL/min   Comment: (NOTE)     The eGFR has been calculated using the CKD EPI equation.     This calculation has not been validated in all clinical situations.     eGFR's persistently <90 mL/min signify possible Chronic Kidney     Disease.  APTT     Status: Abnormal   Collection Time    01/17/13  7:25 AM      Result Value Range   aPTT 100 (*) 24 - 37 seconds   Comment:            IF BASELINE aPTT IS ELEVATED,     SUGGEST PATIENT RISK ASSESSMENT     BE USED TO DETERMINE APPROPRIATE     ANTICOAGULANT THERAPY.    Imaging: No results found.  Assessment:  1. Principal Problem: 2.   Acute on chronic systolic heart failure- (doesn't tolerate AF) 3. Active Problems: 4.   CAD- non obstructive 2004, and 2013 [facility]) 5.   Nonischemic cardiomyopathy,  EF 35-40% by echo 07/2012.  -- now 20-25% 12/2012 6.   VENTRICULAR TACHYCARDIA- on Amiodarone 7.   Atrial fibrillation, persistent, recurrent 8.   CVA 2004 9.   SLEEP DISORDER 10.   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 non functioning atrial lead 11.   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   CKD (chronic kidney disease) stage 3, GFR 30-59 ml/min 13.   Mitral regurgitation 14.   Chronic anticoagulation- Eloquis 15.   Chronic combined systolic and diastolic congestive heart failure 16.   Tricuspid regurgitation 17.   Cardiogenic shock 18.   Plan:  I agree that LVAD is probably best choice. The mitral valve would be difficult to repair (ischemic mechanism). He is talking with an LVAD recipient right now, will be back later to address any questions he may have.  Time Spent Directly with Patient:  25 minutes  Length of Stay:  LOS: 7 days     Nathan Mckinney 01/17/2013, 10:49 AM

## 2013-01-18 ENCOUNTER — Inpatient Hospital Stay (HOSPITAL_COMMUNITY): Payer: Medicare Other

## 2013-01-18 ENCOUNTER — Encounter (HOSPITAL_COMMUNITY): Payer: Self-pay | Admitting: Radiology

## 2013-01-18 DIAGNOSIS — I5023 Acute on chronic systolic (congestive) heart failure: Secondary | ICD-10-CM

## 2013-01-18 DIAGNOSIS — Z01810 Encounter for preprocedural cardiovascular examination: Secondary | ICD-10-CM

## 2013-01-18 DIAGNOSIS — I428 Other cardiomyopathies: Secondary | ICD-10-CM

## 2013-01-18 LAB — BASIC METABOLIC PANEL
BUN: 14 mg/dL (ref 6–23)
CO2: 26 mEq/L (ref 19–32)
Calcium: 8.4 mg/dL (ref 8.4–10.5)
Chloride: 102 mEq/L (ref 96–112)
Creatinine, Ser: 1.31 mg/dL (ref 0.50–1.35)

## 2013-01-18 LAB — PULMONARY FUNCTION TEST

## 2013-01-18 LAB — LUPUS ANTICOAGULANT PANEL
DRVVT: 33 secs (ref <42.9)
Lupus Anticoagulant: DETECTED — AB
PTT Lupus Anticoagulant: 74.2 secs — ABNORMAL HIGH (ref 28.0–43.0)
PTTLA 4:1 Mix: 78.6 secs — ABNORMAL HIGH (ref 28.0–43.0)
PTTLA Confirmation: 16.2 secs — ABNORMAL HIGH (ref <8.0)

## 2013-01-18 LAB — APTT: aPTT: 85 seconds — ABNORMAL HIGH (ref 24–37)

## 2013-01-18 LAB — FACTOR 5 LEIDEN

## 2013-01-18 LAB — HEPARIN LEVEL (UNFRACTIONATED): Heparin Unfractionated: 0.53 IU/mL (ref 0.30–0.70)

## 2013-01-18 LAB — PREALBUMIN: Prealbumin: 14.9 mg/dL — ABNORMAL LOW (ref 17.0–34.0)

## 2013-01-18 LAB — CARBOXYHEMOGLOBIN: Total hemoglobin: 13.3 g/dL — ABNORMAL LOW (ref 13.5–18.0)

## 2013-01-18 MED ORDER — ALBUTEROL SULFATE (5 MG/ML) 0.5% IN NEBU
2.5000 mg | INHALATION_SOLUTION | Freq: Once | RESPIRATORY_TRACT | Status: AC
  Administered 2013-01-18: 2.5 mg via RESPIRATORY_TRACT

## 2013-01-18 MED ORDER — BISACODYL 5 MG PO TBEC: 10.0000 mg | DELAYED_RELEASE_TABLET | Freq: Once | ORAL | Status: DC

## 2013-01-18 MED ORDER — ENSURE COMPLETE PO LIQD: 237.0000 mL | Freq: Two times a day (BID) | ORAL | Status: DC

## 2013-01-18 MED ORDER — PEG 3350-KCL-NA BICARB-NACL 420 G PO SOLR
2000.0000 mL | Freq: Once | ORAL | Status: DC
  Filled 2013-01-18: qty 4000

## 2013-01-18 NOTE — Progress Notes (Addendum)
DAILY PROGRESS NOTE  Subjective:  Dyspnea has improved. He is now interested in destination LVAD. Discussion with palliative care yesterday.  Not recorded to be net negative yesterday. However weight is down at least 1 pound over the past day. Continues on milrinone and amiodarone.   Objective:  Temp:  [97.8 F (36.6 C)-99 F (37.2 C)] 98.3 F (36.8 C) (09/30 0808) Pulse Rate:  [72-106] 101 (09/30 0808) Resp:  [15-28] 15 (09/30 0808) BP: (94-152)/(57-91) 94/70 mmHg (09/30 0808) SpO2:  [91 %-99 %] 95 % (09/30 0808) Weight:  [164 lb 10.9 oz (74.7 kg)] 164 lb 10.9 oz (74.7 kg) (09/30 0500) Weight change: -7.1 oz (-0.2 kg)  Intake/Output from previous day: 09/29 0701 - 09/30 0700 In: 1293.3 [P.O.:460; I.V.:833.3] Out: 825 [Urine:825]  Intake/Output from this shift:    Medications: Current Facility-Administered Medications  Medication Dose Route Frequency Provider Last Rate Last Dose  . 0.9 %  sodium chloride infusion  250 mL Intravenous Continuous Marykay Lex, MD 10 mL/hr at 01/17/13 2000 250 mL at 01/17/13 2000  . 0.9 %  sodium chloride infusion   Intravenous Continuous PRN Dolores Patty, MD 15 mL/hr at 01/17/13 2157    . acetaminophen (TYLENOL) tablet 650 mg  650 mg Oral Q4H PRN Lynden Oxford, MD   650 mg at 01/18/13 0414  . allopurinol (ZYLOPRIM) tablet 100 mg  100 mg Oral QPM Lynden Oxford, MD   100 mg at 01/17/13 1811  . amiodarone (PACERONE) tablet 200 mg  200 mg Oral BID Lynden Oxford, MD   200 mg at 01/17/13 2132  . atorvastatin (LIPITOR) tablet 20 mg  20 mg Oral Daily Lynden Oxford, MD   20 mg at 01/17/13 1002  . digoxin (LANOXIN) tablet 0.0625 mg  0.0625 mg Oral QPM Mihai Croitoru, MD   0.0625 mg at 01/17/13 1811  . donepezil (ARICEPT) tablet 10 mg  10 mg Oral Daily Lynden Oxford, MD   10 mg at 01/17/13 1002  . heparin ADULT infusion 100 units/mL (25000 units/250 mL)  1,000 Units/hr Intravenous Continuous Dolores Patty, MD 10 mL/hr at 01/18/13 0414 1,000  Units/hr at 01/18/13 0414  . hydrocortisone cream 1 % 1 application  1 application Topical TID PRN Marykay Lex, MD      . milrinone [facility]) infusion 200 mcg/mL (0.2 mg/ml)  0.25 mcg/kg/min Intravenous Continuous Dolores Patty, MD 5.7 mL/hr at 01/17/13 2157 0.25 mcg/kg/min at 01/17/13 2157  . ondansetron (ZOFRAN) injection 4 mg  4 mg Intravenous Q6H PRN Lynden Oxford, MD   4 mg at 01/12/13 2137  . sodium chloride 0.9 % injection 10-40 mL  10-40 mL Intracatheter Q12H Aundria Rud, NP   10 mL at 01/17/13 2100  . sodium chloride 0.9 % injection 10-40 mL  10-40 mL Intracatheter PRN Aundria Rud, NP      . sodium chloride 0.9 % injection 3 mL  3 mL Intravenous Q12H Marykay Lex, MD   3 mL at 01/16/13 1000  . sodium chloride 0.9 % injection 3 mL  3 mL Intravenous PRN Marykay Lex, MD        Physical Exam: General appearance: alert and no distress Neck: no adenopathy, no carotid bruit, no JVD, supple, symmetrical, trachea midline and thyroid not enlarged, symmetric, no tenderness/mass/nodules Lungs: clear to auscultation bilaterally Heart: regular rate and rhythm, S1, S2 normal, no murmur, click, rub or gallop Abdomen: soft, non-tender; bowel sounds normal; no masses,  no organomegaly Extremities: extremities normal, atraumatic,  no cyanosis or edema Pulses: 2+ and symmetric Skin: Skin color, texture, turgor normal. No rashes or lesions Neurologic: Grossly normal Psych: Mood, affect pleasant and normal  Lab Results: Results for orders placed during the hospital encounter of 01/10/13 (from the past 48 hour(s))  HEPARIN LEVEL (UNFRACTIONATED)     Status: Abnormal   Collection Time    01/16/13 12:20 PM      Result Value Range   Heparin Unfractionated 1.14 (*) 0.30 - 0.70 IU/mL   Comment:            IF HEPARIN RESULTS ARE BELOW     EXPECTED VALUES, AND PATIENT     DOSAGE HAS BEEN CONFIRMED,     SUGGEST FOLLOW UP TESTING     OF ANTITHROMBIN III LEVELS.     RESULTS CONFIRMED  BY MANUAL DILUTION  APTT     Status: Abnormal   Collection Time    01/16/13 12:20 PM      Result Value Range   aPTT 54 (*) 24 - 37 seconds   Comment:            IF BASELINE aPTT IS ELEVATED,     SUGGEST PATIENT RISK ASSESSMENT     BE USED TO DETERMINE APPROPRIATE     ANTICOAGULANT THERAPY.  HEPARIN LEVEL (UNFRACTIONATED)     Status: Abnormal   Collection Time    01/16/13  9:00 PM      Result Value Range   Heparin Unfractionated 0.99 (*) 0.30 - 0.70 IU/mL   Comment:            IF HEPARIN RESULTS ARE BELOW     EXPECTED VALUES, AND PATIENT     DOSAGE HAS BEEN CONFIRMED,     SUGGEST FOLLOW UP TESTING     OF ANTITHROMBIN III LEVELS.  APTT     Status: Abnormal   Collection Time    01/16/13  9:00 PM      Result Value Range   aPTT 76 (*) 24 - 37 seconds   Comment:            IF BASELINE aPTT IS ELEVATED,     SUGGEST PATIENT RISK ASSESSMENT     BE USED TO DETERMINE APPROPRIATE     ANTICOAGULANT THERAPY.  CARBOXYHEMOGLOBIN     Status: Abnormal   Collection Time    01/17/13  5:42 AM      Result Value Range   Total hemoglobin 13.1 (*) 13.5 - 18.0 g/dL   O2 Saturation 09.8     Carboxyhemoglobin 1.8 (*) 0.5 - 1.5 %   Methemoglobin 0.6  0.0 - 1.5 %  HEPARIN LEVEL (UNFRACTIONATED)     Status: Abnormal   Collection Time    01/17/13  7:25 AM      Result Value Range   Heparin Unfractionated 1.00 (*) 0.30 - 0.70 IU/mL   Comment:            IF HEPARIN RESULTS ARE BELOW     EXPECTED VALUES, AND PATIENT     DOSAGE HAS BEEN CONFIRMED,     SUGGEST FOLLOW UP TESTING     OF ANTITHROMBIN III LEVELS.  BASIC METABOLIC PANEL     Status: Abnormal   Collection Time    01/17/13  7:25 AM      Result Value Range   Sodium 138  135 - 145 mEq/L   Potassium 4.1  3.5 - 5.1 mEq/L   Chloride 101  96 - 112 mEq/L   CO2  27  19 - 32 mEq/L   Glucose, Bld 89  70 - 99 mg/dL   BUN 15  6 - 23 mg/dL   Creatinine, Ser 4.09 (*) 0.50 - 1.35 mg/dL   Calcium 8.6  8.4 - 81.1 mg/dL   GFR calc non Af Amer 46 (*)  >90 mL/min   GFR calc Af Amer 53 (*) >90 mL/min   Comment: (NOTE)     The eGFR has been calculated using the CKD EPI equation.     This calculation has not been validated in all clinical situations.     eGFR's persistently <90 mL/min signify possible Chronic Kidney     Disease.  APTT     Status: Abnormal   Collection Time    01/17/13  7:25 AM      Result Value Range   aPTT 100 (*) 24 - 37 seconds   Comment:            IF BASELINE aPTT IS ELEVATED,     SUGGEST PATIENT RISK ASSESSMENT     BE USED TO DETERMINE APPROPRIATE     ANTICOAGULANT THERAPY.  PSA     Status: None   Collection Time    01/17/13 10:00 AM      Result Value Range   PSA 1.62  <=4.00 ng/mL   Comment: (NOTE)     Test Methodology: ECLIA PSA (Electrochemiluminescence Immunoassay)     For PSA values from 2.5-4.0, particularly in younger men <60 years     old, the AUA and NCCN suggest testing for % Free PSA (3515) and     evaluation of the rate of increase in PSA (PSA velocity).     Performed at [facility]  ANTITHROMBIN III     Status: Abnormal   Collection Time    01/17/13 10:00 AM      Result Value Range   AntiThromb III Func 70 (*) 75 - 120 %  TYPE AND SCREEN     Status: None   Collection Time    01/17/13 10:35 AM      Result Value Range   ABO/RH(D) B POS     Antibody Screen NEG     Sample Expiration 01/20/2013    ABO/RH     Status: None   Collection Time    01/17/13 10:35 AM      Result Value Range   ABO/RH(D) B POS    URINALYSIS, ROUTINE W REFLEX MICROSCOPIC     Status: Abnormal   Collection Time    01/17/13  1:15 PM      Result Value Range   Color, Urine RED (*) YELLOW   Comment: BIOCHEMICALS MAY BE AFFECTED BY COLOR   APPearance CLOUDY (*) CLEAR   Specific Gravity, Urine 1.022  1.005 - 1.030   pH 5.5  5.0 - 8.0   Glucose, UA NEGATIVE  NEGATIVE mg/dL   Hgb urine dipstick LARGE (*) NEGATIVE   Bilirubin Urine MODERATE (*) NEGATIVE   Ketones, ur 15 (*) NEGATIVE mg/dL   Protein, ur 30 (*)  NEGATIVE mg/dL   Urobilinogen, UA 1.0  0.0 - 1.0 mg/dL   Nitrite NEGATIVE  NEGATIVE   Leukocytes, UA MODERATE (*) NEGATIVE  URINE MICROSCOPIC-ADD ON     Status: None   Collection Time    01/17/13  1:15 PM      Result Value Range   WBC, UA 0-2  <3 WBC/hpf   RBC / HPF TOO NUMEROUS TO COUNT  <3 RBC/hpf  Bacteria, UA RARE  RARE  CARBOXYHEMOGLOBIN     Status: Abnormal   Collection Time    01/18/13  3:55 AM      Result Value Range   Total hemoglobin 13.3 (*) 13.5 - 18.0 g/dL   O2 Saturation 16.1     Carboxyhemoglobin 2.2 (*) 0.5 - 1.5 %   Methemoglobin 1.0  0.0 - 1.5 %  HEPARIN LEVEL (UNFRACTIONATED)     Status: None   Collection Time    01/18/13  3:59 AM      Result Value Range   Heparin Unfractionated 0.53  0.30 - 0.70 IU/mL   Comment:            IF HEPARIN RESULTS ARE BELOW     EXPECTED VALUES, AND PATIENT     DOSAGE HAS BEEN CONFIRMED,     SUGGEST FOLLOW UP TESTING     OF ANTITHROMBIN III LEVELS.  BASIC METABOLIC PANEL     Status: Abnormal   Collection Time    01/18/13  3:59 AM      Result Value Range   Sodium 137  135 - 145 mEq/L   Potassium 4.0  3.5 - 5.1 mEq/L   Chloride 102  96 - 112 mEq/L   CO2 26  19 - 32 mEq/L   Glucose, Bld 104 (*) 70 - 99 mg/dL   BUN 14  6 - 23 mg/dL   Creatinine, Ser 0.96  0.50 - 1.35 mg/dL   Calcium 8.4  8.4 - 04.5 mg/dL   GFR calc non Af Amer 52 (*) >90 mL/min   GFR calc Af Amer 60 (*) >90 mL/min   Comment: (NOTE)     The eGFR has been calculated using the CKD EPI equation.     This calculation has not been validated in all clinical situations.     eGFR's persistently <90 mL/min signify possible Chronic Kidney     Disease.  APTT     Status: Abnormal   Collection Time    01/18/13  3:59 AM      Result Value Range   aPTT 85 (*) 24 - 37 seconds   Comment:            IF BASELINE aPTT IS ELEVATED,     SUGGEST PATIENT RISK ASSESSMENT     BE USED TO DETERMINE APPROPRIATE     ANTICOAGULANT THERAPY.    Imaging: Ct Abdomen Pelvis Wo  Contrast  01/17/2013   CLINICAL DATA:  Preop for a left ventricular assist device placement.  EXAM: CT CHEST, ABDOMEN AND PELVIS WITHOUT CONTRAST  TECHNIQUE: Multidetector CT imaging of the chest, abdomen and pelvis was performed following the standard protocol without IV contrast.  COMPARISON:  None  FINDINGS: CT CHEST FINDINGS  The chest wall demonstrates a left-sided permanent pacemaker and a right-sided PICC line. No supraclavicular or axillary mass or adenopathy. The bony thorax is intact. No destructive bone lesions or spinal canal compromise.  The heart is enlarged with a markedly enlarged left ventricle in particular. No pericardial effusion. No mediastinal or hilar lymphadenopathy. Small scattered lymph nodes are noted. Three-vessel coronary artery calcifications are noted.  The lungs demonstrate bibasilar atelectasis and small, right greater than left pleural effusions. No worrisome pulmonary nodules or masses. No bronchiectasis.  CT ABDOMEN AND PELVIS FINDINGS  The liver is grossly normal without IV contrast. No focal hepatic lesions. Gallstones are noted in the gallbladder. No common bile duct dilatation. The pancreas is normal. The spleen is normal. There is  a large right-sided abdominal fluid collection of uncertain significance or etiology. It was present on a prior chest CT from 2011. It may be a benign omental or peritoneal inclusion cyst. The adrenal glands and kidneys are normal except for bilateral renal cysts. No renal calculi or hydronephrosis. No ureteral dilatation.  The stomach, duodenum, small bowel and colon are grossly normal. No inflammatory changes or mass lesions. Moderate diverticulosis involving the sigmoid colon with associated muscular wall thickening. No mesenteric or retroperitoneal mass or adenopathy. Small scattered lymph nodes are noted. The aorta is normal in caliber.  The bladder demonstrates diffuse irregular wall thickening. No discrete mass. The prostate gland is  enlarged and the seminal vesicles are normal. No pelvic mass, adenopathy or free pelvic fluid collections. No inguinal mass or adenopathy. Moderate fat noted in the inguinal canals.  The bony structures are unremarkable.  IMPRESSION: CT CHEST IMPRESSION  1. Cardiac enlargement with a markedly enlarged left ventricle. 2. Bibasilar atelectasis and small effusions.  CT ABDOMEN AND PELVIS IMPRESSION  1. Cholelithiasis. 2. Large right-sided abdominal fluid collection or cyst of uncertain significance or etiology but no change since prior examinations. 3. No acute abdominal/ pelvic findings, mass lesions or adenopathy.   Electronically Signed   By: Loralie Champagne M.D.   On: 01/17/2013 22:02   Ct Chest Wo Contrast  01/17/2013   CLINICAL DATA:  Preop for a left ventricular assist device placement.  EXAM: CT CHEST, ABDOMEN AND PELVIS WITHOUT CONTRAST  TECHNIQUE: Multidetector CT imaging of the chest, abdomen and pelvis was performed following the standard protocol without IV contrast.  COMPARISON:  None  FINDINGS: CT CHEST FINDINGS  The chest wall demonstrates a left-sided permanent pacemaker and a right-sided PICC line. No supraclavicular or axillary mass or adenopathy. The bony thorax is intact. No destructive bone lesions or spinal canal compromise.  The heart is enlarged with a markedly enlarged left ventricle in particular. No pericardial effusion. No mediastinal or hilar lymphadenopathy. Small scattered lymph nodes are noted. Three-vessel coronary artery calcifications are noted.  The lungs demonstrate bibasilar atelectasis and small, right greater than left pleural effusions. No worrisome pulmonary nodules or masses. No bronchiectasis.  CT ABDOMEN AND PELVIS FINDINGS  The liver is grossly normal without IV contrast. No focal hepatic lesions. Gallstones are noted in the gallbladder. No common bile duct dilatation. The pancreas is normal. The spleen is normal. There is a large right-sided abdominal fluid collection  of uncertain significance or etiology. It was present on a prior chest CT from 2011. It may be a benign omental or peritoneal inclusion cyst. The adrenal glands and kidneys are normal except for bilateral renal cysts. No renal calculi or hydronephrosis. No ureteral dilatation.  The stomach, duodenum, small bowel and colon are grossly normal. No inflammatory changes or mass lesions. Moderate diverticulosis involving the sigmoid colon with associated muscular wall thickening. No mesenteric or retroperitoneal mass or adenopathy. Small scattered lymph nodes are noted. The aorta is normal in caliber.  The bladder demonstrates diffuse irregular wall thickening. No discrete mass. The prostate gland is enlarged and the seminal vesicles are normal. No pelvic mass, adenopathy or free pelvic fluid collections. No inguinal mass or adenopathy. Moderate fat noted in the inguinal canals.  The bony structures are unremarkable.  IMPRESSION: CT CHEST IMPRESSION  1. Cardiac enlargement with a markedly enlarged left ventricle. 2. Bibasilar atelectasis and small effusions.  CT ABDOMEN AND PELVIS IMPRESSION  1. Cholelithiasis. 2. Large right-sided abdominal fluid collection or cyst of uncertain significance  or etiology but no change since prior examinations. 3. No acute abdominal/ pelvic findings, mass lesions or adenopathy.   Electronically Signed   By: Loralie Champagne M.D.   On: 01/17/2013 22:02   Dg Chest Port 1 View  01/17/2013   *RADIOLOGY REPORT*  Clinical Data: Confirm right arm PICC line placement  PORTABLE CHEST - 1 VIEW  Comparison: 01/15/2013  Findings: Right-sided PICC line projects over the vicinity of the cavoatrial junction.  No pneumothorax.  Stable cardiac enlargement and cardiac pacer.  No abnormal opacities.  Right nipple shadow identified.  This is stable from multiple prior studies including 01/10/2013 and Dec 07, 2012.  IMPRESSION: Right PICC line with tip over the vicinity of the cavoatrial junction.   Original  Report Authenticated By: Esperanza Heir, M.D.   EKG: V-paced  Assessment:  1. Principal Problem: 2.   Acute on chronic systolic heart failure- (doesn't tolerate AF) 3. Active Problems: 4.   CAD- non obstructive 2004, and 2013 [facility]) 5.   Nonischemic cardiomyopathy,  EF 35-40% by echo 07/2012.  -- now 20-25% 12/2012 6.   VENTRICULAR TACHYCARDIA- on Amiodarone 7.   Atrial fibrillation, persistent, recurrent 8.   CVA 2004 9.   SLEEP DISORDER 10.   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 non functioning atrial lead 11.   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   CKD (chronic kidney disease) stage 3, GFR 30-59 ml/min 13.   Mitral regurgitation 14.   Chronic anticoagulation- Eloquis 15.   Chronic combined systolic and diastolic congestive heart failure 16.   Tricuspid regurgitation 17.   Cardiogenic shock 18.   Palliative care encounter 19.   Weakness generalized 20.   Other malaise and fatigue 21.   Plan:  1. Urine output has slowed. He has not received lasix due to a low CVP over the past 2 days. Will touch base with Dr. Gala Romney and Dr. Donata Clay regarding timing of LVAD for treatment of cardiogenic shock. Atrial fibrillation is persistent and he is now programmed for VVIR due to lead dislodgement. I'm told by the nursing staff this will likely be in 2 weeks and he will most likely need to go home with inotrope therapy.  For now, continue milrinone. Watch CVP and dose lasix accordingly. On heparin (for a-fib) to allow a potential colonoscopy procedure while in hospital.  Blood pressure is stable.   Time Spent Directly with Patient:  15 minutes  Length of Stay:  LOS: 8 days   Chrystie Nose, MD, [facility] Attending Cardiologist [facility]  HILTY,Kenneth C 01/18/2013, 8:59 AM

## 2013-01-18 NOTE — Consult Note (Signed)
PHARMACY CONSULT NOTE  Pharmacy Consult:  Heparin Indication: Transition from Eliquis [Apixaban] to Heparin infusion for atrial fibrillation   Allergies: Allergies  Allergen Reactions  . Penicillins     REACTION: Rash   Height/Weight: Height: 5\' 11"  (180.3 cm) Weight: 164 lb 10.9 oz (74.7 kg) IBW/kg (Calculated) : 75.3 Dosing weight 75.5 kg  Vital Signs: BP 112/60  Pulse 93  Temp(Src) 97.9 F (36.6 C) (Oral)  Resp 18  Ht 5\' 11"  (1.803 m)  Wt 164 lb 10.9 oz (74.7 kg)  BMI 22.98 kg/m2  SpO2 91%  Labs:  Recent Labs  01/16/13 0412  01/16/13 2100 01/17/13 0725 01/18/13 0359  APTT 74*  < > 76* 100* 85*  HEPARINUNFRC >2.20*  < > 0.99* 1.00* 0.53  CREATININE 1.68*  --   --  1.45* 1.31  < > = values in this interval not displayed. Lab Results  Component Value Date   INR 2.2* 06/13/2008   INR 1.7* 06/12/2008   INR 2.1* 06/09/2008   Estimated Creatinine Clearance: 52.3 ml/min (by C-G formula based on Cr of 1.31).  Assessment: 72 yom transitioning from chronic Apixaban dosing for Hx AFib to Heparin infusion during evaluation of patient for possible MVR +/- MAZE procedure but now discussion leaning more toward LVAD.  Patient previously on  Apixaban 5 mg q 12 hours.  Heparin level still elevated due to recent apixiban so heparin dosing will be managed by aPTT.  Current  heparin rate of 1000 units/hr with aPTT 85sec at goal and drawn from opposite arm as heparin infusion.  CBC stable but he c/o some bleeding with urination 9/28 pm.  Plan to continue Heparin during LVAD work up - endoscopy/colonoscopy then restart po AC.  Goal of Therapy:   Heparin level 0.3-0.7 units/ml after apixaban has worn off and aPTT  66-102 sec  Monitor pltc   Plan:  - Continue heparin drip 1000 units/hr Daily CBC, aPTT  Leota Sauers Pharm.D. CPP, BCPS Clinical Pharmacist [phone] 01/18/2013 7:07 AM

## 2013-01-18 NOTE — Progress Notes (Signed)
NUTRITION CONSULT/FOLLOW UP  Intervention:    Strawberry Ensure Complete twice daily (350 kcals, 13 gm protein per 8 fl oz bottle) RD to follow for nutrition care plan  New Nutrition Dx:   Increased nutrient needs related to pre-surgical optimization as evidenced by estimated nutrition needs, ongoing  Goal:   Pt to meet >/= 90% of their estimated nutrition needs, progressing  Monitor:   PO & supplemental intake, weight, labs, I/O's  Assessment:   Patient with PMH of HTN, CVA, nonischemic cardiomyopathy and ventricle tachycardia with ICD insertion; recently hospitalized for acute on chronic systolic heart failure; presented with worsening shortness of breath.  RD consult for nutrition optimization prior to surgery (ie LVAD).  Patient reports his appetite is doing pretty good; he is eating > 50% of his meals per his report; per flowsheet records 50-75%; patient didn't really care for supplemental addition however, RD encouraged him to at least try Ensure Complete to maximize kcal, protein intake ---> patient agreeable.  Height: Ht Readings from Last 1 Encounters:  01/13/13 5\' 11"  (1.803 m)    Weight Status:   Wt Readings from Last 1 Encounters:  01/18/13 164 lb 10.9 oz (74.7 kg)    Re-estimated needs:  Kcal: 1900-2100 Protein: 95-105 gm Fluid: 1.9-2.1 L  Skin: Intact  Diet Order: Cardiac   Intake/Output Summary (Last 24 hours) at 01/18/13 1100 Last data filed at 01/18/13 0700  Gross per 24 hour  Intake   1169 ml  Output    550 ml  Net    619 ml    Labs:   Recent Labs Lab 01/16/13 0412 01/17/13 0725 01/18/13 0359  NA 140 138 137  K 3.6 4.1 4.0  CL 105 101 102  CO2 25 27 26   BUN 26* 15 14  CREATININE 1.68* 1.45* 1.31  CALCIUM 8.1* 8.6 8.4  GLUCOSE 79 89 104*    Scheduled Meds: . allopurinol  100 mg Oral QPM  . amiodarone  200 mg Oral BID  . atorvastatin  20 mg Oral Daily  . digoxin  0.0625 mg Oral QPM  . donepezil  10 mg Oral Daily  . sodium  chloride  10-40 mL Intracatheter Q12H  . sodium chloride  3 mL Intravenous Q12H    Continuous Infusions: . sodium chloride 250 mL (01/17/13 2000)  . sodium chloride 15 mL/hr at 01/17/13 2157  . heparin 1,000 Units/hr (01/18/13 0414)  . milrinone 0.25 mcg/kg/min (01/17/13 2157)    Maureen Chatters, RD, LDN Pager #: [phone] After-Hours Pager #: [phone]

## 2013-01-18 NOTE — Consult Note (Addendum)
[address]       [place] [address]             [phone]        Nathan Mckinney [place] Medical Record #[id number] Date of Birth: Aug 08, 1940  Referring: No ref. provider found Primary Care: Pcp Not In System  Chief Complaint:    Chief Complaint  Patient presents with  . Shortness of Breath   patient examined, 2-D echocardiogram and right heart cath data reviewed.   History of Present Illness:     72 year old Caucasian male admitted to the hospital for the fourth time this year for treatment of refractory advanced heart failure. Patient has history of chronic atrial fibrillation, dilated cardiomyopathy with EF of 0.15-0.20, 2 previous cardiac catheterizations which by report showed no significant CAD, and severe mitral regurgitation with mild to moderate tricuspid regurgitation. The patient in the past he sustained an embolic stroke from his A. fib with resolution of symptoms and he has chronic renal insufficiency with a baseline creatinine around 2.0. Patient was admitted to the hospital with progressive dyspnea on exertion, recently advanced to dyspnea at rest and and simple episodes. He is unable to tolerate any activity and asleep sternum today. His last 2-D echocardiogram showed severe LV dysfunction with severe mitral regurgitation, mild RV dysfunction and biatrial enlargement.  He underwent Swan placement earlier this admission was found have a cardiac index of 1.3 with PA pressures of 50/25 and low mixed venous saturation. Fortunately when he was placed on milrinone his hemodynamics significantly improved as well as his creatinine. He is able to walk the hallway the CCU after the Swan removed but while still on milrinone to a PICC line but was extremely fatigued. He is currently in sinus rhythm.  The patient was evaluated for possible surgical therapy including possible mitral valve and tricuspid valve repair with combined maze procedure versus  destination LVAD placement with combined tricuspid repair. His heart failure so advanced and his LV function is so poor that destination LVAD therapy is felt to be the best option iforthis gentleman.  The patient has a long history of Coumadin therapy without GI or other bleeding complications. The patient's AICD has not fired in several months. The patient has a poor appetite and has lost some weight over the past few months. He has no history of peripheral vascular disease. The patient underwent coronary arteriograms earlier this summer when he was hospitalized in [place] on vacation at the beach and by report he had no significant CAD but the report and the images are pending. Current Activity/ Functional Status: Very poor functional status unable to tolerate normal activity   Zubrod Score: At the time of surgery this patient's most appropriate activity status/level should be described as: []  Normal activity, no symptoms []  Symptoms, fully ambulatory []  Symptoms, in bed less than or equal to 50% of the time [x]  Symptoms, in bed greater than 50% of the time but less than 100% []  Bedridden []  Moribund  Past Medical History  Diagnosis Date  . Hypertension   . Chronic tension headaches   . Tinea cruris   . Tinea unguium   . Hernia, hiatal   . CVA (cerebral infarction)   . Systolic heart failure     EF 02-72% in 08/2012 (Echo at [facility], [place])  . Bradycardia   . Ventricular tachycardia   . Syncope   . CAD (coronary artery disease)     h/o MI  in 2001, admitted for possible STEMI at [facility] in 08/2012 - Cardiac cath at [facility] in 08/2012: Nonobstructive coronary artery disease  . Sleep disorder   . Atrial fibrillation     On Eliquis, new onset in 08/2012 - successful cardioversion to sinus rhythm  . Automatic implantable cardioverter-defibrillator in situ   . Pacemaker     ICD/PACEMAKER   . Arthritis     HANDS  . CHF (congestive heart failure)   . Anginal pain     . Depression   . Shortness of breath   . Pneumonia   . Heart murmur   . Stroke   . Mitral regurgitation   . Acute on chronic systolic heart failure-  10/22/2012  . Chronic anticoagulation- Eloquis   . CKD (chronic kidney disease) stage 3, GFR 30-59 ml/min   . 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 Atrial fibrillation, persistent, recurrent   . Chronic combined systolic and diastolic congestive heart failure   . Non-ischemic cardiomyopathy 1997  . Tricuspid regurgitation     Past Surgical History  Procedure Laterality Date  . Vasectomy    . Icd implantation  06/12/08    Medtronic biventricular ICD, remote - no, Dr. Ladona Ridgel  . Cardiac catheterization  02/03/03    Dr. Jenne Campus   . Insert / replace / remove pacemaker      pacer/ICD    History  Smoking status  . Passive Smoke Exposure - Never Smoker  Smokeless tobacco  . Never Used    Comment: denies tobacco use     History  Alcohol Use No    History   Social History  . Marital Status: Married    Spouse Name: N/A    Number of Children: N/A  . Years of Education: N/A   Occupational History  . Retired    Social History Main Topics  . Smoking status: Passive Smoke Exposure - Never Smoker  . Smokeless tobacco: Never Used     Comment: denies tobacco use   . Alcohol Use: No  . Drug Use: No  . Sexual Activity: Not on file   Other Topics Concern  . Not on file   Social History Narrative   Married and lives in [place].    Allergies  Allergen Reactions  . Penicillins     REACTION: Rash    Current Facility-Administered Medications  Medication Dose Route Frequency Provider Last Rate Last Dose  . 0.9 %  sodium chloride infusion  250 mL Intravenous Continuous Marykay Lex, MD 10 mL/hr at 01/17/13 2000 250 mL at 01/17/13 2000  . 0.9 %  sodium chloride infusion   Intravenous Continuous PRN Dolores Patty, MD 15 mL/hr at 01/17/13 2157    . acetaminophen (TYLENOL) tablet 650 mg  650 mg Oral Q4H PRN Lynden Oxford, MD   650 mg at 01/18/13 0414  . allopurinol (ZYLOPRIM) tablet 100 mg  100 mg Oral QPM Lynden Oxford, MD   100 mg at 01/17/13 1811  . amiodarone (PACERONE) tablet 200 mg  200 mg Oral BID Lynden Oxford, MD   200 mg at 01/18/13 1013  . atorvastatin (LIPITOR) tablet 20 mg  20 mg Oral Daily Lynden Oxford, MD   20 mg at 01/18/13 1013  . digoxin (LANOXIN) tablet 0.0625 mg  0.0625 mg Oral QPM Mihai Croitoru, MD   0.0625 mg at 01/17/13 1811  . donepezil (ARICEPT) tablet 10 mg  10 mg Oral Daily Lynden Oxford, MD   10 mg at  01/18/13 1013  . feeding supplement (ENSURE COMPLETE) liquid 237 mL  237 mL Oral BID BM Ailene Ards, RD      . heparin ADULT infusion 100 units/mL (25000 units/250 mL)  1,000 Units/hr Intravenous Continuous Dolores Patty, MD 10 mL/hr at 01/18/13 0414 1,000 Units/hr at 01/18/13 0414  . hydrocortisone cream 1 % 1 application  1 application Topical TID PRN Marykay Lex, MD      . milrinone [facility]) infusion 200 mcg/mL (0.2 mg/ml)  0.25 mcg/kg/min Intravenous Continuous Dolores Patty, MD 5.7 mL/hr at 01/17/13 2157 0.25 mcg/kg/min at 01/17/13 2157  . ondansetron (ZOFRAN) injection 4 mg  4 mg Intravenous Q6H PRN Lynden Oxford, MD   4 mg at 01/12/13 2137  . sodium chloride 0.9 % injection 10-40 mL  10-40 mL Intracatheter Q12H Aundria Rud, NP   10 mL at 01/17/13 2100  . sodium chloride 0.9 % injection 10-40 mL  10-40 mL Intracatheter PRN Aundria Rud, NP      . sodium chloride 0.9 % injection 3 mL  3 mL Intravenous Q12H Marykay Lex, MD   3 mL at 01/16/13 1000  . sodium chloride 0.9 % injection 3 mL  3 mL Intravenous PRN Marykay Lex, MD        Prescriptions prior to admission  Medication Sig Dispense Refill  . acetaminophen (TYLENOL) 500 MG tablet Take 1,000 mg by mouth every 6 (six) hours as needed for pain.      Marland Kitchen allopurinol (ZYLOPRIM) 100 MG tablet Take 1 tablet by mouth every evening.       Marland Kitchen amiodarone (PACERONE) 200 MG tablet Take 200 mg by mouth 2  (two) times daily.       Marland Kitchen apixaban (ELIQUIS) 5 MG TABS tablet Take 5 mg by mouth 2 (two) times daily.      Marland Kitchen atorvastatin (LIPITOR) 20 MG tablet Take 20 mg by mouth daily.      . Cholecalciferol 10000 UNITS CAPS Take 1 capsule by mouth daily.      . digoxin (LANOXIN) 0.125 MG tablet Take 0.125 mg by mouth every evening.      . donepezil (ARICEPT) 10 MG tablet Take 10 mg by mouth daily.      . furosemide (LASIX) 40 MG tablet Take 1 tablet (40 mg total) by mouth 2 (two) times daily.  60 tablet  11  . lisinopril (PRINIVIL,ZESTRIL) 5 MG tablet Take 2.5 mg by mouth daily.      . metoprolol (LOPRESSOR) 100 MG tablet Take 0.5 tablets (50 mg total) by mouth 2 (two) times daily.        Family History  Problem Relation Age of Onset  . Cancer Mother     unclear what type  . Cancer Father     stomach     Review of Systems:     Cardiac Review of Systems: Y or N  Chest Pain [ N.   ]  Resting SOB [ Y.  ] Exertional SOB  [Y.  ]  Orthopnea [Y.  ]   Pedal Edema [ Y.  ]    Palpitations [ YY ] Syncope  [  Y.]   Presyncope [Y.   ]  General Review of Systems: [Y] = yes [  ]=no Constitional: recent weight change [Y.  ]; anorexia [Y.  ]; fatigue [ Y. ]; nausea [  ]; night sweats [  ]; fever [  ]; or chills [  ]  Dental: poor dentition[  ]; Last Dentist visit: Twice a year for cleaning  Eye : blurred vision [  ]; diplopia [   ]; vision changes [  ];  Amaurosis fugax[  ]; Resp: cough [  ];  wheezing[  ];  hemoptysis[  ]; shortness of breath[  ]; paroxysmal nocturnal dyspnea[  ]; dyspnea on exertion[  ]; or orthopnea[  ];  GI:  gallstones[  ], vomiting[  ];  dysphagia[  ]; melena[  ];  hematochezia [  ]; heartburn[  ];   Hx of  Colonoscopy[  ]; GU: kidney stones [  ]; hematuria[  ];   dysuria [  ];  nocturia[  ];  history of     obstruction [  ]; urinary frequency [  ]             Skin: rash, swelling[  ];, hair loss[  ];  peripheral edema[  ];  or  itching[  ]; Musculosketetal: myalgias[  ];  joint swelling[  ];  joint erythema[  ];  joint pain[  ];  back pain[  ];  Heme/Lymph: bruising[  ];  bleeding[  ];  anemia[  ];  Neuro: TIA[  ];  headaches[  ];  stroke[  ];  vertigo[  ];  seizures[  ];   paresthesias[  ];  difficulty walking[  ];  Psych:depression[  ]; anxiety[  ];  Endocrine: diabetes[  ];  thyroid dysfunction[  ];  Immunizations: Flu [  ]; Pneumococcal[  ];  Other:  Physical Exam: BP 114/68  Pulse 80  Temp(Src) 98 F (36.7 C) (Oral)  Resp 20  Ht 5\' 11"  (1.803 m)  Wt 164 lb 10.9 oz (74.7 kg)  BMI 22.98 kg/m2  SpO2 93%  Physical exam Gen.-Pleasant 72 year old Caucasian male in the CCU appears chronically ill and very weak HEENT normocephalic pupils equal dentition good Neck-positive JVD no adenopathy or bruit Thorax-distant breath sounds, no deformity or tenderness- Cardiac-regular rhythm 2/6 MR murmur, positive S3 gallop Abdomen-scaphoid nontender no pulsatile mass Extremities-no clubbing cyanosis, skin is cool Vascular-weak peripheral pulses no evidence of venous insufficiency of lower extremities Neuro-no focal motor deficit  Diagnostic Studies & Laboratory data:     Recent Radiology Findings:   Ct Abdomen Pelvis Wo Contrast  01/17/2013   CLINICAL DATA:  Preop for a left ventricular assist device placement.  EXAM: CT CHEST, ABDOMEN AND PELVIS WITHOUT CONTRAST  TECHNIQUE: Multidetector CT imaging of the chest, abdomen and pelvis was performed following the standard protocol without IV contrast.  COMPARISON:  None  FINDINGS: CT CHEST FINDINGS  The chest wall demonstrates a left-sided permanent pacemaker and a right-sided PICC line. No supraclavicular or axillary mass or adenopathy. The bony thorax is intact. No destructive bone lesions or spinal canal compromise.  The heart is enlarged with a markedly enlarged left ventricle in particular. No pericardial effusion. No mediastinal or hilar lymphadenopathy. Small  scattered lymph nodes are noted. Three-vessel coronary artery calcifications are noted.  The lungs demonstrate bibasilar atelectasis and small, right greater than left pleural effusions. No worrisome pulmonary nodules or masses. No bronchiectasis.  CT ABDOMEN AND PELVIS FINDINGS  The liver is grossly normal without IV contrast. No focal hepatic lesions. Gallstones are noted in the gallbladder. No common bile duct dilatation. The pancreas is normal. The spleen is normal. There is a large right-sided abdominal fluid collection of uncertain significance or etiology. It was present on a prior chest CT from 2011. It may be a benign  omental or peritoneal inclusion cyst. The adrenal glands and kidneys are normal except for bilateral renal cysts. No renal calculi or hydronephrosis. No ureteral dilatation.  The stomach, duodenum, small bowel and colon are grossly normal. No inflammatory changes or mass lesions. Moderate diverticulosis involving the sigmoid colon with associated muscular wall thickening. No mesenteric or retroperitoneal mass or adenopathy. Small scattered lymph nodes are noted. The aorta is normal in caliber.  The bladder demonstrates diffuse irregular wall thickening. No discrete mass. The prostate gland is enlarged and the seminal vesicles are normal. No pelvic mass, adenopathy or free pelvic fluid collections. No inguinal mass or adenopathy. Moderate fat noted in the inguinal canals.  The bony structures are unremarkable.  IMPRESSION: CT CHEST IMPRESSION  1. Cardiac enlargement with a markedly enlarged left ventricle. 2. Bibasilar atelectasis and small effusions.  CT ABDOMEN AND PELVIS IMPRESSION  1. Cholelithiasis. 2. Large right-sided abdominal fluid collection or cyst of uncertain significance or etiology but no change since prior examinations. 3. No acute abdominal/ pelvic findings, mass lesions or adenopathy.   Electronically Signed   By: Loralie Champagne M.D.   On: 01/17/2013 22:02   Ct Chest Wo  Contrast  01/17/2013   CLINICAL DATA:  Preop for a left ventricular assist device placement.  EXAM: CT CHEST, ABDOMEN AND PELVIS WITHOUT CONTRAST  TECHNIQUE: Multidetector CT imaging of the chest, abdomen and pelvis was performed following the standard protocol without IV contrast.  COMPARISON:  None  FINDINGS: CT CHEST FINDINGS  The chest wall demonstrates a left-sided permanent pacemaker and a right-sided PICC line. No supraclavicular or axillary mass or adenopathy. The bony thorax is intact. No destructive bone lesions or spinal canal compromise.  The heart is enlarged with a markedly enlarged left ventricle in particular. No pericardial effusion. No mediastinal or hilar lymphadenopathy. Small scattered lymph nodes are noted. Three-vessel coronary artery calcifications are noted.  The lungs demonstrate bibasilar atelectasis and small, right greater than left pleural effusions. No worrisome pulmonary nodules or masses. No bronchiectasis.  CT ABDOMEN AND PELVIS FINDINGS  The liver is grossly normal without IV contrast. No focal hepatic lesions. Gallstones are noted in the gallbladder. No common bile duct dilatation. The pancreas is normal. The spleen is normal. There is a large right-sided abdominal fluid collection of uncertain significance or etiology. It was present on a prior chest CT from 2011. It may be a benign omental or peritoneal inclusion cyst. The adrenal glands and kidneys are normal except for bilateral renal cysts. No renal calculi or hydronephrosis. No ureteral dilatation.  The stomach, duodenum, small bowel and colon are grossly normal. No inflammatory changes or mass lesions. Moderate diverticulosis involving the sigmoid colon with associated muscular wall thickening. No mesenteric or retroperitoneal mass or adenopathy. Small scattered lymph nodes are noted. The aorta is normal in caliber.  The bladder demonstrates diffuse irregular wall thickening. No discrete mass. The prostate gland is  enlarged and the seminal vesicles are normal. No pelvic mass, adenopathy or free pelvic fluid collections. No inguinal mass or adenopathy. Moderate fat noted in the inguinal canals.  The bony structures are unremarkable.  IMPRESSION: CT CHEST IMPRESSION  1. Cardiac enlargement with a markedly enlarged left ventricle. 2. Bibasilar atelectasis and small effusions.  CT ABDOMEN AND PELVIS IMPRESSION  1. Cholelithiasis. 2. Large right-sided abdominal fluid collection or cyst of uncertain significance or etiology but no change since prior examinations. 3. No acute abdominal/ pelvic findings, mass lesions or adenopathy.   Electronically Signed   By: Loraine Leriche  Gallerani M.D.   On: 01/17/2013 22:02   Dg Chest Port 1 View  01/17/2013   *RADIOLOGY REPORT*  Clinical Data: Confirm right arm PICC line placement  PORTABLE CHEST - 1 VIEW  Comparison: 01/15/2013  Findings: Right-sided PICC line projects over the vicinity of the cavoatrial junction.  No pneumothorax.  Stable cardiac enlargement and cardiac pacer.  No abnormal opacities.  Right nipple shadow identified.  This is stable from multiple prior studies including 01/10/2013 and 12/28/12.  IMPRESSION: Right PICC line with tip over the vicinity of the cavoatrial junction.   Original Report Authenticated By: Esperanza Heir, M.D.      Recent Lab Findings: Lab Results  Component Value Date   WBC 6.7 01/15/2013   HGB 13.3 01/15/2013   HCT 39.1 01/15/2013   PLT 162 01/15/2013   GLUCOSE 104* 01/18/2013   CHOL 183 01/30/2012   TRIG 201* 01/30/2012   HDL 38* 01/30/2012   LDLCALC 105* 01/30/2012   ALT 25 01/11/2013   AST 27 01/11/2013   NA 137 01/18/2013   K 4.0 01/18/2013   CL 102 01/18/2013   CREATININE 1.31 01/18/2013   BUN 14 01/18/2013   CO2 26 01/18/2013   TSH 3.857 10/21/2012   INR 2.2* 06/13/2008   HGBA1C  Value: 6.1 (NOTE)   The ADA recommends the following therapeutic goal for glycemic   control related to Hgb A1C measurement:   Goal of Therapy:   < 7.0% Hgb A1C    Reference: American Diabetes Association: Clinical Practice   Recommendations 2008, Diabetes Care,  2008, 31:(Suppl 1). 06/06/2008      Assessment / Plan:      The patient has advanced and rapidly progressive heart failure now [place] heart Association class IV. Clinically the patient has stabilized on milrinone with improved cardiac index, mixed venous saturation, and creatinine. His CT abdomen shows gallstones in his CT chest showssignificant mediastinal adenopathy or pulmonary lesions. Carotid duplex is negative and PFTs had been completed.  The patient would benefit from improved symptoms of heart failure and improved survival with a destination LVAD. I've discussed the procedure with the patient as well as the expected postop recovery and potential risks. I agree with the plan to let the patient be discharged home on home milrinone to improve his conditioning with home physical therapy, improved nutrition and then admission in the near term for LVAD implantation.      @ME1 @ 01/18/2013 12:06 PM

## 2013-01-18 NOTE — Care Management Note (Signed)
    Page 1 of 2   01/18/2013     10:20:57 AM   CARE MANAGEMENT NOTE 01/18/2013  Patient:  Nathan Mckinney, Nathan Mckinney   Account Number:  [id number]  Date Initiated:  01/11/2013  Documentation initiated by:  Junius Creamer  Subjective/Objective Assessment:   adm w chf     Action/Plan:   lives w wife   Anticipated DC Date:     Anticipated DC Plan:  HOME W HOME HEALTH SERVICES      DC Planning Services  CM consult      PAC Choice  DURABLE MEDICAL EQUIPMENT  HOME HEALTH   Choice offered to / List presented to:  C-1 Patient   DME arranged  IV PUMP/EQUIPMENT      DME agency  [facility]     [facility] arranged  HH-1 RN  HH-10 DISEASE MANAGEMENT      HH agency  [facility]   Status of service:  Completed, signed off Medicare Important Message given?   (If response is "NO", the following Medicare IM given date fields will be blank) Date Medicare IM given:   Date Additional Medicare IM given:    Discharge Disposition:  HOME W HOME HEALTH SERVICES  Per UR Regulation:  Reviewed for med. necessity/level of care/duration of stay  If discussed at Long Length of Stay Meetings, dates discussed:   01/18/2013    Comments:  9/30 1019a debbie Monaye Blackie rn,bsn spoke w pt and went over list of hhc agencies. no pref as long and hhc agency takes [employer]. ref to pam and donna w [facility] for home iv milrinone. pt being evaled for lvad.  01/14/13 1104 Pt. had successfull cardioversion on 9/24. May dc over weekend home with family. Tera Mater, RN, BSN NCM [phone]

## 2013-01-18 NOTE — Consult Note (Signed)
Chart reviewed, I agree with the above documentation, including the assessment and plan. We are asked to performed EGD/colonoscopy in the hospital setting as evaluation before anticipated LVAD placement for severe heart failure Heparin needs to be held 6 hours before procedures

## 2013-01-18 NOTE — Progress Notes (Signed)
Chaplain responded to spiritual consult. Patient was alert and sitting up in recliner. He was friendly and pleasant. Patient spoke of his several children who were all making plans to come visit him in light of his recent health problems. He seems to have plenty of support from his family and has also mentioned his own minister visiting him a few times during his stay here. Patient did express that the upcoming surgery was "scary" but that his faith served as a major source of strength for him. Chaplain practiced active listening and empathic presence.

## 2013-01-18 NOTE — Consult Note (Signed)
[facility] Consult: 1:37 PM 01/18/2013   Referring Provider: Dr Gala Romney  Primary Care Physician:  Dr Susann Givens,  Primary Gastroenterologist: none  Cardiologist:  Alanda Amass..  Reason for Consultation:  Asked to perform screening EGD and colonoscopy  HPI: Nathan Mckinney is a 72 y.o. male.  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 chonic AC with Eliquis. Admission with CHF 8/10 - 8/15.  EF 35 to 40% in 07/2012 but was 15 to 20% in 08/2012 in [place].  Admitted 9/22 with recurrent CHF flare, a fib.  Echo EF was 20 to 25% on 9/23. However, per cardiology notes the EF is actually 15% by cardiac cath. S/p DCCV on  9/24. Previous DCCVs x 2 around 07/2012.  Dr Gala Romney and Dr Donata Clay are planning LVAD in about 2 weeks. Dr Donata Clay wants colonoscopy and EGD beforehand  Pt remotely had both >10 years ago.  No recall of any pathology beforehand or found at [facility].  No GIB hx.  Transfused in 1962 when he had malaria.   Daily BMs.  Nausea present on initial admission, now resolved.      Past Medical History  Diagnosis Date  . Hypertension   . Chronic tension headaches   . Tinea cruris   . Tinea unguium   . Hernia, hiatal   . CVA (cerebral infarction)   . Systolic heart failure     EF 29-56% in 08/2012 (Echo at [facility], [place])  . Bradycardia   . Ventricular tachycardia   . Syncope   . CAD (coronary artery disease)     h/o MI in 2001, admitted for possible STEMI at [facility] in 08/2012 - Cardiac cath at [facility] in 08/2012: Nonobstructive coronary artery disease  . Sleep disorder   . Atrial fibrillation     On Eliquis, new onset in 08/2012 - successful cardioversion to sinus rhythm  . Automatic implantable cardioverter-defibrillator in situ   . Pacemaker     ICD/PACEMAKER   . Arthritis     HANDS  .  CHF (congestive heart failure)   . Anginal pain   . Depression   . Shortness of breath   . Pneumonia   . Heart murmur   . Stroke   . Mitral regurgitation   . Acute on chronic systolic heart failure-  10/22/2012  . Chronic anticoagulation- Eloquis   . CKD (chronic kidney disease) stage 3, GFR 30-59 ml/min   . 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 Atrial fibrillation, persistent, recurrent   . Chronic combined systolic and diastolic congestive heart failure   . Non-ischemic cardiomyopathy 1997  . Tricuspid regurgitation     Past Surgical History  Procedure Laterality Date  . Vasectomy    . Icd implantation  06/12/08    Medtronic biventricular ICD, remote - no, Dr. Ladona Ridgel  . Cardiac catheterization  02/03/03    Dr. Jenne Campus   . Insert / replace / remove pacemaker      pacer/ICD    Prior to Admission medications   Medication Sig Start Date End Date Taking? Authorizing Provider  acetaminophen (TYLENOL) 500 MG tablet Take 1,000 mg by mouth every 6 (six) hours as needed for pain.   Yes Historical Provider, MD  allopurinol (ZYLOPRIM) 100 MG tablet Take 1 tablet by mouth every evening.  12/16/11  Yes Historical Provider, MD  amiodarone (PACERONE) 200 MG tablet Take 200 mg by mouth 2 (  two) times daily.    Yes Historical Provider, MD  apixaban (ELIQUIS) 5 MG TABS tablet Take 5 mg by mouth 2 (two) times daily.   Yes Historical Provider, MD  atorvastatin (LIPITOR) 20 MG tablet Take 20 mg by mouth daily.   Yes Historical Provider, MD  Cholecalciferol 10000 UNITS CAPS Take 1 capsule by mouth daily.   Yes Historical Provider, MD  digoxin (LANOXIN) 0.125 MG tablet Take 0.125 mg by mouth every evening.   Yes Historical Provider, MD  donepezil (ARICEPT) 10 MG tablet Take 10 mg by mouth daily.   Yes Historical Provider, MD  furosemide (LASIX) 40 MG tablet Take 1 tablet (40 mg total) by mouth 2 (two) times daily. 12/02/12  Yes Luke K Kilroy, PA-C  lisinopril (PRINIVIL,ZESTRIL) 5 MG tablet Take  2.5 mg by mouth daily.   Yes Historical Provider, MD  metoprolol (LOPRESSOR) 100 MG tablet Take 0.5 tablets (50 mg total) by mouth 2 (two) times daily. 12/02/12  Yes Abelino Derrick, PA-C    Scheduled Meds: . allopurinol  100 mg Oral QPM  . amiodarone  200 mg Oral BID  . atorvastatin  20 mg Oral Daily  . digoxin  0.0625 mg Oral QPM  . donepezil  10 mg Oral Daily  . feeding supplement  237 mL Oral BID BM  . sodium chloride  10-40 mL Intracatheter Q12H  . sodium chloride  3 mL Intravenous Q12H   Infusions: . sodium chloride 250 mL (01/17/13 2000)  . sodium chloride 15 mL/hr at 01/17/13 2157  . heparin 1,000 Units/hr (01/18/13 0414)  . milrinone 0.25 mcg/kg/min (01/17/13 2157)   PRN Meds: sodium chloride, acetaminophen, hydrocortisone cream, ondansetron (ZOFRAN) IV, sodium chloride, sodium chloride   Allergies as of 01/10/2013 - Review Complete 01/10/2013  Allergen Reaction Noted  . Penicillins      Family History  Problem Relation Age of Onset  . Cancer Mother     unclear what type  . Cancer Father     stomach    History   Social History  . Marital Status: Married    Spouse Name: N/A    Number of Children: N/A  . Years of Education: N/A   Occupational History  . Retired    Social History Main Topics  . Smoking status: Passive Smoke Exposure - Never Smoker  . Smokeless tobacco: Never Used     Comment: denies tobacco use   . Alcohol Use: No  . Drug Use: No  . Sexual Activity: Not on file   Other Topics Concern  . Not on file   Social History Narrative   Married and lives in [place].    REVIEW OF SYSTEMS: General: Weight gain [ ] ; Weight loss Cove.Etienne ]; Anorexia Cove.Etienne ]; Fatigue [ y]; Fever [ ] ; Chills [ ] ; Weakness [ ]   Cardiac: Chest pain/pressure [ ] ; Resting SOB Cove.Etienne ]; Exertional SOB [ y]; Orthopnea Cove.Etienne ]; Pedal Edema [ ] ; Palpitations [ ] ; Syncope [ ] ; Presyncope Cove.Etienne ]; Paroxysmal nocturnal dyspnea[ ]   Pulmonary: Cough [ y]; Wheezing[ ] ; Hemoptysis[ n]; Sputum [ ] ;  Snoring [ ]  GI: Vomiting[n ]; Dysphagia[n ]; Melena[ n]; Hematochezia [ n]; Heartburn[n ]; Abdominal pain [n; Constipation [ n]; Diarrhea [n ]; BRBPR Milo.Brash ]  GU: Hematuria[n] ; Dysuria [n ]; Nocturia[ ]   Vascular: Pain in legs with walking [ ] ; Pain in feet with lying flat [ ] ; Non-healing sores [ ] ; Stroke Cove.Etienne ]; TIA [ ] ; Slurred speech [ ] ;  Neuro:  HOH[y]  Memory problems[y] Headaches[ n]; Vertigo[n ]; Seizures[ n]; Paresthesias[n]Blurred vision [ ] ; Diplopia [ ] ; Vision changes [ ]  Ortho/Skin: Arthritis [ y]; Joint pain Cove.Etienne ]; Muscle pain [ ] ; Joint swelling [ ] ; Back Pain [ ] ; Rash [ ]   Psych: Depression[ ] ; Anxiety[ ]   Heme: Bleeding problems [ n]; Clotting disorders [ n]; Anemia [n ]  Endocrine: Diabetes [ n]; Thyroid dysfunction[ ]    PHYSICAL EXAM: Vital signs in last 24 hours: Temp:  [97.8 F (36.6 C)-98.3 F (36.8 C)] 98 F (36.7 C) (09/30 1134) Pulse Rate:  [72-106] 80 (09/30 1134) Resp:  [15-28] 20 (09/30 1134) BP: (94-152)/(60-91) 114/68 mmHg (09/30 1134) SpO2:  [91 %-99 %] 93 % (09/30 1134) Weight:  [74.7 kg (164 lb 10.9 oz)] 74.7 kg (164 lb 10.9 oz) (09/30 0500)  General: elderly wm in NAD Head:  No swelling  Eyes:  No icterus or pallor Ears:  HOH  Nose:  No discharge or congestion, Mouth:  Clear, moist Neck:  No mass or jvd Lungs:  Clear bil Heart: RRR Abdomen:  Soft, NT, ND.  Active BS.  Marland Kitchen   Rectal: not done   Musc/Skeltl: no joint swelling or deformity Extremities:  No pedal edema  Neurologic:  HOH.  Recall of events limited but oriented x 3.  No tremor, no limb weakness.  Skin:  No rash or telangectasia Tattoos:  none Nodes:  No cervical adenopathy   Psych:  Pleasant, not anxious or depressed.  Cooperative.   Intake/Output from previous day: 09/29 0701 - 09/30 0700 In: 1293.3 [P.O.:460; I.V.:833.3] Out: 825 [Urine:825] Intake/Output this shift:    LAB RESULTS: No results found for this basename: WBC, HGB, HCT, PLT,  in the last 72 hours BMET Lab  Results  Component Value Date   NA 137 01/18/2013   NA 138 01/17/2013   NA 140 01/16/2013   K 4.0 01/18/2013   K 4.1 01/17/2013   K 3.6 01/16/2013   CL 102 01/18/2013   CL 101 01/17/2013   CL 105 01/16/2013   CO2 26 01/18/2013   CO2 27 01/17/2013   CO2 25 01/16/2013   GLUCOSE 104* 01/18/2013   GLUCOSE 89 01/17/2013   GLUCOSE 79 01/16/2013   BUN 14 01/18/2013   BUN 15 01/17/2013   BUN 26* 01/16/2013   CREATININE 1.31 01/18/2013   CREATININE 1.45* 01/17/2013   CREATININE 1.68* 01/16/2013   CALCIUM 8.4 01/18/2013   CALCIUM 8.6 01/17/2013   CALCIUM 8.1* 01/16/2013   LFT No results found for this basename: PROT, ALBUMIN, AST, ALT, ALKPHOS, BILITOT, BILIDIR, IBILI,  in the last 72 hours PT/INR Lab Results  Component Value Date   INR 2.2* 06/13/2008   INR 1.7* 06/12/2008   INR 2.1* 06/09/2008    RADIOLOGY STUDIES: Ct Abdomen Pelvis Wo Contrast Ct Chest Wo Contrast 01/17/2013   FINDINGS: CT CHEST FINDINGS  The chest wall demonstrates a left-sided permanent pacemaker and a right-sided PICC line. No supraclavicular or axillary mass or adenopathy. The bony thorax is intact. No destructive bone lesions or spinal canal compromise.  The heart is enlarged with a markedly enlarged left ventricle in particular. No pericardial effusion. No mediastinal or hilar lymphadenopathy. Small scattered lymph nodes are noted. Three-vessel coronary artery calcifications are noted.  The lungs demonstrate bibasilar atelectasis and small, right greater than left pleural effusions. No worrisome pulmonary nodules or masses. No bronchiectasis.   CT ABDOMEN AND PELVIS FINDINGS  The liver is grossly normal without IV contrast. No focal hepatic  lesions. Gallstones are noted in the gallbladder. No common bile duct dilatation. The pancreas is normal. The spleen is normal. There is a large right-sided abdominal fluid collection of uncertain significance or etiology. It was present on a prior chest CT from 2011. It may be a benign omental or  peritoneal inclusion cyst. The adrenal glands and kidneys are normal except for bilateral renal cysts. No renal calculi or hydronephrosis. No ureteral dilatation.  The stomach, duodenum, small bowel and colon are grossly normal. No inflammatory changes or mass lesions. Moderate diverticulosis involving the sigmoid colon with associated muscular wall thickening. No mesenteric or retroperitoneal mass or adenopathy. Small scattered lymph nodes are noted. The aorta is normal in caliber.  The bladder demonstrates diffuse irregular wall thickening. No discrete mass. The prostate gland is enlarged and the seminal vesicles are normal. No pelvic mass, adenopathy or free pelvic fluid collections. No inguinal mass or adenopathy. Moderate fat noted in the inguinal canals.  The bony structures are unremarkable.  IMPRESSION: CT CHEST IMPRESSION  1. Cardiac enlargement with a markedly enlarged left ventricle. 2. Bibasilar atelectasis and small effusions.  CT ABDOMEN AND PELVIS IMPRESSION  1. Cholelithiasis. 2. Large right-sided abdominal fluid collection or cyst of uncertain significance or etiology but no change since prior examinations. 3. No acute abdominal/ pelvic findings, mass lesions or adenopathy.   Electronically Signed   By: Loralie Champagne M.D.   On: 01/17/2013 22:02   Dg Chest Port 1 View 01/17/2013   Findings: Right-sided PICC line projects over the vicinity of the cavoatrial junction.  No pneumothorax.  Stable cardiac enlargement and cardiac pacer.  No abnormal opacities.  Right nipple shadow identified.  This is stable from multiple prior studies including 01/10/2013 and December 24, 2012.  IMPRESSION: Right PICC line with tip over the vicinity of the cavoatrial junction.   Original Report Authenticated By: Esperanza Heir, M.D.    ENDOSCOPIC STUDIES: As per HPI  IMPRESSION: *  End stage diastolic and systolic CHF.  For LVAD in next 2 weeks *  Afib.  DCCV 9/24 *  S/p ICD *  Right abdominal fluid collection of  unlear etiology, unchanged since 2011 *  Cholelithiasis.   PLAN: *  Set up for colon/egd at 11:00 AM tomorrow.  *  Does the abd fluid collection warrant tap or other investigational study? *  Going to stop Heparin drip before procedures.    LOS: 8 days   Jennye Moccasin  01/18/2013, 1:37 PM Pager: [phone]

## 2013-01-18 NOTE — Progress Notes (Signed)
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 Alanda Amass since 2001. Admitted with AF, cardiogenic shock EF 20% with severe MR. Underwent DC-CV.   Swan removed yesterday. Remains on milrinone 0.25. Hemodynamics and renal function much improved. OOB yesterday and walked 250 ft. Denies SOB, orthopnea, or CP. Still in NSR on amiodarone. Cr 1.31. Had CT of abdomen/chest and pelvis without any acute process. Started LVAD pre-evaluation and met with palliative care for goals of care. 2L PICC placed  Co-ox- 76.6 CVP 4  Objective:    Vital Signs:   Temp:  [97.8 F (36.6 C)-99 F (37.2 C)] 97.9 F (36.6 C) (09/30 0400) Pulse Rate:  [72-109] 93 (09/30 0600) Resp:  [15-25] 18 (09/30 0600) BP: (99-158)/(57-102) 112/60 mmHg (09/30 0600) SpO2:  [91 %-99 %] 91 % (09/30 0600) Weight:  [164 lb 10.9 oz (74.7 kg)] 164 lb 10.9 oz (74.7 kg) (09/30 0500) Last BM Date: 01/17/13  Weight change: Filed Weights   01/16/13 0500 01/17/13 0500 01/18/13 0500  Weight: 168 lb 6.9 oz (76.4 kg) 165 lb 2 oz (74.9 kg) 164 lb 10.9 oz (74.7 kg)    Intake/Output:   Intake/Output Summary (Last 24 hours) at 01/18/13 0703 Last data filed at 01/18/13 0600  Gross per 24 hour  Intake 1252.6 ml  Output    825 ml  Net  427.6 ml     Physical Exam: General:  Comfortable. Lying flat.  HEENT: normal Neck: supple. JVP flat.. Carotids 2+ bilat; no bruits. No lymphadenopathy or thryomegaly appreciated. Cor: PMI laterally displaced. Regular rate & rhythm.2/6 MR. + s3 Lungs: clear Abdomen: soft, nontender, non distended. No hepatosplenomegaly. No bruits or masses. Good bowel sounds. Extremities: no  cyanosis, clubbing, rash, edema RUE PICC Neuro: alert & orientedx3, cranial nerves grossly intact. moves all 4 extremities w/o difficulty. Affect pleasant  Telemetry: SR with v-pacing  Labs: Basic Metabolic Panel:  Recent Labs Lab 01/13/13 0957 01/15/13 0500 01/16/13 0412 01/17/13 0725 01/18/13 0359  NA 139 137 140 138 137  K 5.0 3.9 3.6 4.1 4.0  CL 104 102 105 101 102  CO2 25 27 25 27 26   GLUCOSE 101* 87 79 89 104*  BUN 38* 36* 26* 15 14  CREATININE 2.13* 1.82* 1.68* 1.45* 1.31  CALCIUM 9.3 8.2* 8.1* 8.6 8.4    Liver Function Tests: No results found for this basename: AST, ALT, ALKPHOS, BILITOT, PROT, ALBUMIN,  in the last 168 hours No results found for this basename: LIPASE, AMYLASE,  in the last 168 hours No results found for this basename: AMMONIA,  in the last 168 hours  CBC:  Recent Labs Lab 01/15/13 0500  WBC 6.7  HGB 13.3  HCT 39.1  MCV 87.5  PLT 162    Cardiac Enzymes:  Recent Labs Lab 01/11/13 1158 01/11/13 1534  TROPONINI <0.30 <0.30    BNP: BNP (last 3 results)  Recent Labs  11/30/12 0455 01/10/13 2204 01/11/13 1158  PROBNP 29987.0* 27226.0* 21360.0*    CBG: No results found for this basename: GLUCAP,  in the last 168 hours  Coagulation Studies: No results found for this basename: LABPROT, INR,  in the last 72 hours  Other results: EKG: SR with  v pacing at 71  Imaging: Ct Abdomen Pelvis Wo Contrast  01/17/2013   CLINICAL DATA:  Preop for a left ventricular assist device placement.  EXAM: CT CHEST, ABDOMEN AND PELVIS WITHOUT CONTRAST  TECHNIQUE: Multidetector CT imaging of the chest, abdomen and pelvis was performed following the standard protocol without IV contrast.  COMPARISON:  None  FINDINGS: CT CHEST FINDINGS  The chest wall demonstrates a left-sided permanent pacemaker and a right-sided PICC line. No supraclavicular or axillary mass or adenopathy. The bony thorax is intact. No destructive bone lesions or spinal canal  compromise.  The heart is enlarged with a markedly enlarged left ventricle in particular. No pericardial effusion. No mediastinal or hilar lymphadenopathy. Small scattered lymph nodes are noted. Three-vessel coronary artery calcifications are noted.  The lungs demonstrate bibasilar atelectasis and small, right greater than left pleural effusions. No worrisome pulmonary nodules or masses. No bronchiectasis.  CT ABDOMEN AND PELVIS FINDINGS  The liver is grossly normal without IV contrast. No focal hepatic lesions. Gallstones are noted in the gallbladder. No common bile duct dilatation. The pancreas is normal. The spleen is normal. There is a large right-sided abdominal fluid collection of uncertain significance or etiology. It was present on a prior chest CT from 2011. It may be a benign omental or peritoneal inclusion cyst. The adrenal glands and kidneys are normal except for bilateral renal cysts. No renal calculi or hydronephrosis. No ureteral dilatation.  The stomach, duodenum, small bowel and colon are grossly normal. No inflammatory changes or mass lesions. Moderate diverticulosis involving the sigmoid colon with associated muscular wall thickening. No mesenteric or retroperitoneal mass or adenopathy. Small scattered lymph nodes are noted. The aorta is normal in caliber.  The bladder demonstrates diffuse irregular wall thickening. No discrete mass. The prostate gland is enlarged and the seminal vesicles are normal. No pelvic mass, adenopathy or free pelvic fluid collections. No inguinal mass or adenopathy. Moderate fat noted in the inguinal canals.  The bony structures are unremarkable.  IMPRESSION: CT CHEST IMPRESSION  1. Cardiac enlargement with a markedly enlarged left ventricle. 2. Bibasilar atelectasis and small effusions.  CT ABDOMEN AND PELVIS IMPRESSION  1. Cholelithiasis. 2. Large right-sided abdominal fluid collection or cyst of uncertain significance or etiology but no change since prior examinations.  3. No acute abdominal/ pelvic findings, mass lesions or adenopathy.   Electronically Signed   By: Loralie Champagne M.D.   On: 01/17/2013 22:02   Ct Chest Wo Contrast  01/17/2013   CLINICAL DATA:  Preop for a left ventricular assist device placement.  EXAM: CT CHEST, ABDOMEN AND PELVIS WITHOUT CONTRAST  TECHNIQUE: Multidetector CT imaging of the chest, abdomen and pelvis was performed following the standard protocol without IV contrast.  COMPARISON:  None  FINDINGS: CT CHEST FINDINGS  The chest wall demonstrates a left-sided permanent pacemaker and a right-sided PICC line. No supraclavicular or axillary mass or adenopathy. The bony thorax is intact. No destructive bone lesions or spinal canal compromise.  The heart is enlarged with a markedly enlarged left ventricle in particular. No pericardial effusion. No mediastinal or hilar lymphadenopathy. Small scattered lymph nodes are noted. Three-vessel coronary artery calcifications are noted.  The lungs demonstrate bibasilar atelectasis and small, right greater than left pleural effusions. No worrisome pulmonary nodules or masses. No bronchiectasis.  CT ABDOMEN AND PELVIS FINDINGS  The liver is grossly normal without IV contrast. No focal hepatic lesions. Gallstones are noted in the gallbladder. No common bile duct dilatation. The pancreas is normal. The  spleen is normal. There is a large right-sided abdominal fluid collection of uncertain significance or etiology. It was present on a prior chest CT from 2011. It may be a benign omental or peritoneal inclusion cyst. The adrenal glands and kidneys are normal except for bilateral renal cysts. No renal calculi or hydronephrosis. No ureteral dilatation.  The stomach, duodenum, small bowel and colon are grossly normal. No inflammatory changes or mass lesions. Moderate diverticulosis involving the sigmoid colon with associated muscular wall thickening. No mesenteric or retroperitoneal mass or adenopathy. Small scattered lymph  nodes are noted. The aorta is normal in caliber.  The bladder demonstrates diffuse irregular wall thickening. No discrete mass. The prostate gland is enlarged and the seminal vesicles are normal. No pelvic mass, adenopathy or free pelvic fluid collections. No inguinal mass or adenopathy. Moderate fat noted in the inguinal canals.  The bony structures are unremarkable.  IMPRESSION: CT CHEST IMPRESSION  1. Cardiac enlargement with a markedly enlarged left ventricle. 2. Bibasilar atelectasis and small effusions.  CT ABDOMEN AND PELVIS IMPRESSION  1. Cholelithiasis. 2. Large right-sided abdominal fluid collection or cyst of uncertain significance or etiology but no change since prior examinations. 3. No acute abdominal/ pelvic findings, mass lesions or adenopathy.   Electronically Signed   By: Loralie Champagne M.D.   On: 01/17/2013 22:02   Dg Chest Port 1 View  01/17/2013   *RADIOLOGY REPORT*  Clinical Data: Confirm right arm PICC line placement  PORTABLE CHEST - 1 VIEW  Comparison: 01/15/2013  Findings: Right-sided PICC line projects over the vicinity of the cavoatrial junction.  No pneumothorax.  Stable cardiac enlargement and cardiac pacer.  No abnormal opacities.  Right nipple shadow identified.  This is stable from multiple prior studies including 01/10/2013 and 12/02/2012.  IMPRESSION: Right PICC line with tip over the vicinity of the cavoatrial junction.   Original Report Authenticated By: Esperanza Heir, M.D.     Medications:     Current Medications: . allopurinol  100 mg Oral QPM  . amiodarone  200 mg Oral BID  . atorvastatin  20 mg Oral Daily  . digoxin  0.0625 mg Oral QPM  . donepezil  10 mg Oral Daily  . sodium chloride  10-40 mL Intracatheter Q12H  . sodium chloride  3 mL Intravenous Q12H    Infusions: . sodium chloride 250 mL (01/17/13 2000)  . sodium chloride 15 mL/hr at 01/17/13 2157  . heparin 1,000 Units/hr (01/18/13 0414)  . milrinone 0.25 mcg/kg/min (01/17/13 2157)      Assessment:   1. A/C systolic HF with biventricular dysfunction and cardiogenic shock 2. Severe mitral regurgitation 3. Recurrent AF - now in SR after DC-CV 4. A/c renal failure, stage IV - improving with milrinone 5. 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 Hematuria  Plan/Discussion:    Continues to improve on milrinone and Cr stable. PICC in. Patient and family have decided to proceed with LVAD evaluation. Patient met with Palliative Care yesterday to establish goals of care and met with a LVAD patient.  Will consult GI today for endoscopy/colonoscopy. PFTs completed.   Weight remains stable and volume status stable. Will hold off on diuretics one more day. Off b-blocker due to low output and not on ACE due to renal failure.   Maintaining SR on amio, will continue. Will continue heparin until colonoscopy/endoscopy and then restart eliquis.    Likely home by end of the week with readmit for LVAD on Oct 16th.   Continue to ambulate with CR.  Consult PT.  Ulla Potash B NP-C 7:03 AM Length of Stay: 8 Advanced Heart Failure Team Pager [phone] (M-F; 7a - 4p)  Please contact [facility] for night-coverage after hours (4p -7a ) and weekends on [email or web address]  Patient seen and examined with Ulla Potash, NP. We discussed all aspects of the encounter. I agree with the assessment and plan as stated above.   Agree with above. LVAD planned tentatively for October 16th. Have asked GI to see for scope prior to d/c. Likely d/c later in week on home milrinone with return for VAD on Oct 16th. Encouraged him to ambulate as much as possible.   Marcy Sookdeo,MD 10:57 AM

## 2013-01-18 NOTE — Progress Notes (Signed)
[facility]  Patient Status:   New pt for [facility] this admission  [facility] is providing the following services:   HH RN, [facility] for home Milrinone therapy.  [facility] Coordinator Team will follow in hospital and provide pre-discharge teaching to promote smooth transition home when ordered by MD.  If patient discharges after hours, please call [phone].   Sedalia Muta 01/18/2013, 10:42 AM

## 2013-01-18 NOTE — Progress Notes (Signed)
CARDIAC REHAB PHASE I   PRE:  Rate/Rhythm: 90paced  BP:  Supine:   Sitting: 134/71  Standing:    SaO2: 96%RA  MODE:  Ambulation: 270 ft   POST:  Rate/Rhythm: 102 paced  BP:  Supine: 138/92  Sitting:   Standing:    SaO2: 95%RA 1440-1510 Pt walked 270 ft on RA with rolling walker and asst x 1. Tires easily. Loss of balance once and said he was a little dizzy. Deconditioned. To bed after walk.  Had chills and stated cold. Turned heat up and got heated blanket and pt felt much better.Luetta Nutting, RN BSN  01/18/2013 3:05 PM

## 2013-01-18 NOTE — Progress Notes (Signed)
VAD evaluation consent reviewed and signed by pt and wife. Initial VAD teaching completed with pt and wife. VAD teaching packet left at bedside for their review. Patient, wife, and close friend Jonny Ruiz) asked questions and had good interaction with VAD coordinator; all questions answered regarding VAD implant, hospital stay, and what to expect when discharged home. Pt identified wife as primary caregiver; also identified two sons, one daughter-in-law, and church family members as additional resources post discharge. Explained need for 24/7 care when pt discharged home; both pt and caregiver(s) verbalized understanding of above. Explained that LVAD can be implanted for two indications: 1. Bridge to transplant - used for patients who cannot safely wait for heart transplant.  2. Destination therapy - used for patients until end of life or recovery of heart function. Patient and caregiver(s) acknowledge understanding that pt will be getting LVAD as destination therapy.  Patient and family met with VAD patient who demonstrated his equipment and shared hospital and life experiences in living with HM II LVAD; also shared his contact information with patient and family and encouraged them to call if any questions or support needed.   I have provided the patient with my contact information in order for them to call back if they have any questions.

## 2013-01-19 ENCOUNTER — Encounter (HOSPITAL_COMMUNITY)
Admission: EM | Disposition: A | Discharge status: Home Health | Payer: Self-pay | Source: Home / Self Care | Attending: Cardiovascular Disease

## 2013-01-19 ENCOUNTER — Encounter (HOSPITAL_COMMUNITY): Payer: Self-pay | Admitting: Internal Medicine

## 2013-01-19 DIAGNOSIS — D126 Benign neoplasm of colon, unspecified: Secondary | ICD-10-CM

## 2013-01-19 DIAGNOSIS — Z7901 Long term (current) use of anticoagulants: Secondary | ICD-10-CM

## 2013-01-19 DIAGNOSIS — Z1211 Encounter for screening for malignant neoplasm of colon: Secondary | ICD-10-CM

## 2013-01-19 DIAGNOSIS — I5042 Chronic combined systolic (congestive) and diastolic (congestive) heart failure: Secondary | ICD-10-CM

## 2013-01-19 DIAGNOSIS — I509 Heart failure, unspecified: Secondary | ICD-10-CM

## 2013-01-19 DIAGNOSIS — I059 Rheumatic mitral valve disease, unspecified: Secondary | ICD-10-CM

## 2013-01-19 DIAGNOSIS — I5023 Acute on chronic systolic (congestive) heart failure: Secondary | ICD-10-CM

## 2013-01-19 HISTORY — PX: COLONOSCOPY: SHX5424

## 2013-01-19 HISTORY — PX: ESOPHAGOGASTRODUODENOSCOPY: SHX5428

## 2013-01-19 LAB — CBC
HCT: 37.6 % — ABNORMAL LOW (ref 39.0–52.0)
Hemoglobin: 12.8 g/dL — ABNORMAL LOW (ref 13.0–17.0)
MCV: 86.6 fL (ref 78.0–100.0)
RBC: 4.34 MIL/uL (ref 4.22–5.81)
WBC: 7.3 10*3/uL (ref 4.0–10.5)

## 2013-01-19 LAB — BASIC METABOLIC PANEL
BUN: 12 mg/dL (ref 6–23)
CO2: 25 mEq/L (ref 19–32)
Calcium: 8.6 mg/dL (ref 8.4–10.5)
GFR calc non Af Amer: 51 mL/min — ABNORMAL LOW (ref >90)
Glucose, Bld: 82 mg/dL (ref 70–99)
Potassium: 4.2 mEq/L (ref 3.5–5.1)
Sodium: 138 mEq/L (ref 135–145)

## 2013-01-19 LAB — CARBOXYHEMOGLOBIN
Methemoglobin: 0.6 % (ref 0.0–1.5)
Total hemoglobin: 12.9 g/dL — ABNORMAL LOW (ref 13.5–18.0)

## 2013-01-19 LAB — PROTHROMBIN GENE MUTATION

## 2013-01-19 SURGERY — COLONOSCOPY
Anesthesia: Moderate Sedation

## 2013-01-19 MED ORDER — DIPHENHYDRAMINE HCL 50 MG/ML IJ SOLN
INTRAMUSCULAR | Status: AC
  Filled 2013-01-19: qty 1

## 2013-01-19 MED ORDER — FENTANYL CITRATE 0.05 MG/ML IJ SOLN
INTRAMUSCULAR | Status: AC
  Filled 2013-01-19: qty 2

## 2013-01-19 MED ORDER — MIDAZOLAM HCL 5 MG/ML IJ SOLN
INTRAMUSCULAR | Status: AC
Start: 2013-01-19 — End: 2013-01-19
  Filled 2013-01-19: qty 1

## 2013-01-19 MED ORDER — LISINOPRIL 2.5 MG PO TABS
2.5000 mg | ORAL_TABLET | Freq: Every day | ORAL | Status: DC
  Administered 2013-01-19 – 2013-01-21 (×3): 2.5 mg via ORAL
  Filled 2013-01-19 (×3): qty 1

## 2013-01-19 MED ORDER — LEVALBUTEROL HCL 0.63 MG/3ML IN NEBU: 0.6300 mg | INHALATION_SOLUTION | Freq: Four times a day (QID) | RESPIRATORY_TRACT | Status: DC | PRN

## 2013-01-19 MED ORDER — APIXABAN 5 MG PO TABS
5.0000 mg | ORAL_TABLET | Freq: Two times a day (BID) | ORAL | Status: DC
  Administered 2013-01-19 – 2013-01-21 (×4): 5 mg via ORAL
  Filled 2013-01-19 (×5): qty 1

## 2013-01-19 MED ORDER — BUTAMBEN-TETRACAINE-BENZOCAINE 2-2-14 % EX AERO
INHALATION_SPRAY | CUTANEOUS | Status: DC | PRN
  Administered 2013-01-19: 2 via TOPICAL

## 2013-01-19 MED ORDER — MIDAZOLAM HCL 5 MG/5ML IJ SOLN
INTRAMUSCULAR | Status: DC | PRN
  Administered 2013-01-19: 2 mg via INTRAVENOUS

## 2013-01-19 MED ORDER — MIDAZOLAM HCL 10 MG/2ML IJ SOLN
INTRAMUSCULAR | Status: DC | PRN
  Administered 2013-01-19: 2 mg via INTRAVENOUS

## 2013-01-19 MED ORDER — FENTANYL CITRATE 0.05 MG/ML IJ SOLN
INTRAMUSCULAR | Status: DC | PRN
  Administered 2013-01-19 (×2): 25 ug via INTRAVENOUS

## 2013-01-19 NOTE — Op Note (Signed)
[facility] [address] [place], [address]   COLONOSCOPY PROCEDURE REPORT  PATIENT: Nathan Mckinney, Nathan Mckinney  MR#: [id number] BIRTHDATE: 1940/11/12 , 72  yrs. old GENDER: Male ENDOSCOPIST: Beverley Fiedler, MD REFERRED [id number] Lexine Baton, M.D. PROCEDURE DATE:  01/19/2013 PROCEDURE:   Colonoscopy with cold biopsy polypectomy First Screening Colonoscopy - Avg.  risk and is 50 yrs.  old or older - No.  Prior Negative Screening - Now for repeat screening. 10 or more years since last screening  History of Adenoma - Now for follow-up colonoscopy & has been > or = to 3 yrs.  N/A  Polyps Removed Today? Yes. ASA CLASS:   Class IV INDICATIONS:average risk screening. MEDICATIONS: These medications were titrated to patient response per physician's verbal order, Fentanyl 75 mcg IV, and Versed 6 mg IV  DESCRIPTION OF PROCEDURE:   After the risks benefits and alternatives of the procedure were thoroughly explained, informed consent was obtained.  A digital rectal exam revealed external hemorrhoids.   The Pentax Ped Colon [id number]  endoscope was introduced through the anus and advanced to the cecum, which was identified by both the appendix and ileocecal valve. No adverse events experienced.   The quality of the prep was Moviprep fair The instrument was then slowly withdrawn as the colon was fully examined.   COLON FINDINGS: A sessile polyp measuring 3 mm in size was found in the descending colon.  A polypectomy was performed with cold forceps.  The resection was complete and the polyp tissue was completely retrieved.   Moderate diverticulosis was noted in the ascending colon, descending colon, and sigmoid colon.  Retroflexed views revealed external hemorrhoids.     The scope was withdrawn and the procedure completed.  COMPLICATIONS: There were no complications.  ENDOSCOPIC IMPRESSION: 1.   Sessile polyp measuring 3 mm in size was found in the descending colon;  polypectomy was performed with cold forceps 2.   Moderate diverticulosis was noted in the ascending colon, descending colon, and sigmoid colon 3.   External hemorrhoids  RECOMMENDATIONS: 1.  Await pathology results 2.  High fiber diet   eSigned:  Beverley Fiedler, MD 01/19/2013 12:12 PM cc: The Patient

## 2013-01-19 NOTE — Progress Notes (Addendum)
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 Alanda Amass since 2001. Admitted with AF, cardiogenic shock EF 20% with severe MR. Underwent DC-CV.   Stable overnight. Remains on milrinone 0.25. CT head yesterday. He is walking with PT/CR and walked 270 ft before he was fatigued. Cr stable. Going for endoscopy/colonscopy today. Denies SOB, orthopnea, or CP. Prealbumin 14.9, nutrition consulted.   Co-ox- 75 CVP 2  Objective:    Vital Signs:   Temp:  [98 F (36.7 C)-98.8 F (37.1 C)] 98.1 F (36.7 C) (10/01 0400) Pulse Rate:  [79-106] 91 (09/30 1645) Resp:  [15-24] 24 (10/01 0700) BP: (94-151)/(58-95) 126/70 mmHg (10/01 0700) SpO2:  [92 %-97 %] 92 % (10/01 0400) Weight:  [165 lb 9.1 oz (75.1 kg)] 165 lb 9.1 oz (75.1 kg) (10/01 0500) Last BM Date: 01/18/13  Weight change: Filed Weights   01/17/13 0500 01/18/13 0500 01/19/13 0500  Weight: 165 lb 2 oz (74.9 kg) 164 lb 10.9 oz (74.7 kg) 165 lb 9.1 oz (75.1 kg)    Intake/Output:   Intake/Output Summary (Last 24 hours) at 01/19/13 0708 Last data filed at 01/19/13 0600  Gross per 24 hour  Intake 1496.1 ml  Output    652 ml  Net  844.1 ml     Physical Exam: General:  Comfortable. Lying flat.  HEENT: normal Neck: supple. JVP flat.. Carotids 2+ bilat; no bruits. No lymphadenopathy or thryomegaly appreciated. Cor: PMI laterally displaced. Regular rate & rhythm.2/6 MR. + s3 Lungs: clear Abdomen: soft, nontender, non distended. No hepatosplenomegaly. No bruits or masses. Good bowel sounds. Extremities: no cyanosis, clubbing, rash, edema RUE PICC Neuro: alert & orientedx3, cranial nerves grossly intact. moves all 4  extremities w/o difficulty. Affect pleasant  Telemetry: SR with v-pacing  Labs: Basic Metabolic Panel:  Recent Labs Lab 01/15/13 0500 01/16/13 0412 01/17/13 0725 01/18/13 0359 01/19/13 0500  NA 137 140 138 137 138  K 3.9 3.6 4.1 4.0 4.2  CL 102 105 101 102 105  CO2 27 25 27 26 25   GLUCOSE 87 79 89 104* 82  BUN 36* 26* 15 14 12   CREATININE 1.82* 1.68* 1.45* 1.31 1.33  CALCIUM 8.2* 8.1* 8.6 8.4 8.6    Liver Function Tests: No results found for this basename: AST, ALT, ALKPHOS, BILITOT, PROT, ALBUMIN,  in the last 168 hours No results found for this basename: LIPASE, AMYLASE,  in the last 168 hours No results found for this basename: AMMONIA,  in the last 168 hours  CBC:  Recent Labs Lab 01/15/13 0500 01/19/13 0500  WBC 6.7 7.3  HGB 13.3 12.8*  HCT 39.1 37.6*  MCV 87.5 86.6  PLT 162 134*    Cardiac Enzymes: No results found for this basename: CKTOTAL, CKMB, CKMBINDEX, TROPONINI,  in the last 168 hours  BNP: BNP (last 3 results)  Recent Labs  11/30/12 0455 01/10/13 2204 01/11/13 1158  PROBNP 29987.0* 27226.0* 21360.0*    CBG: No results found for this basename: GLUCAP,  in the last 168 hours  Coagulation Studies: No results found for this basename: LABPROT, INR,  in the last 72 hours  Other results: EKG: SR with v pacing at 71  Imaging: Ct Abdomen Pelvis  Wo Contrast  01/17/2013   CLINICAL DATA:  Preop for a left ventricular assist device placement.  EXAM: CT CHEST, ABDOMEN AND PELVIS WITHOUT CONTRAST  TECHNIQUE: Multidetector CT imaging of the chest, abdomen and pelvis was performed following the standard protocol without IV contrast.  COMPARISON:  None  FINDINGS: CT CHEST FINDINGS  The chest wall demonstrates a left-sided permanent pacemaker and a right-sided PICC line. No supraclavicular or axillary mass or adenopathy. The bony thorax is intact. No destructive bone lesions or spinal canal compromise.  The heart is enlarged with a markedly enlarged left  ventricle in particular. No pericardial effusion. No mediastinal or hilar lymphadenopathy. Small scattered lymph nodes are noted. Three-vessel coronary artery calcifications are noted.  The lungs demonstrate bibasilar atelectasis and small, right greater than left pleural effusions. No worrisome pulmonary nodules or masses. No bronchiectasis.  CT ABDOMEN AND PELVIS FINDINGS  The liver is grossly normal without IV contrast. No focal hepatic lesions. Gallstones are noted in the gallbladder. No common bile duct dilatation. The pancreas is normal. The spleen is normal. There is a large right-sided abdominal fluid collection of uncertain significance or etiology. It was present on a prior chest CT from 2011. It may be a benign omental or peritoneal inclusion cyst. The adrenal glands and kidneys are normal except for bilateral renal cysts. No renal calculi or hydronephrosis. No ureteral dilatation.  The stomach, duodenum, small bowel and colon are grossly normal. No inflammatory changes or mass lesions. Moderate diverticulosis involving the sigmoid colon with associated muscular wall thickening. No mesenteric or retroperitoneal mass or adenopathy. Small scattered lymph nodes are noted. The aorta is normal in caliber.  The bladder demonstrates diffuse irregular wall thickening. No discrete mass. The prostate gland is enlarged and the seminal vesicles are normal. No pelvic mass, adenopathy or free pelvic fluid collections. No inguinal mass or adenopathy. Moderate fat noted in the inguinal canals.  The bony structures are unremarkable.  IMPRESSION: CT CHEST IMPRESSION  1. Cardiac enlargement with a markedly enlarged left ventricle. 2. Bibasilar atelectasis and small effusions.  CT ABDOMEN AND PELVIS IMPRESSION  1. Cholelithiasis. 2. Large right-sided abdominal fluid collection or cyst of uncertain significance or etiology but no change since prior examinations. 3. No acute abdominal/ pelvic findings, mass lesions or  adenopathy.   Electronically Signed   By: Loralie Champagne M.D.   On: 01/17/2013 22:02   Ct Head Wo Contrast  01/18/2013   CLINICAL DATA:  Pre LVAD placement.  EXAM: CT HEAD WITHOUT CONTRAST  TECHNIQUE: Contiguous axial images were obtained from the base of the skull through the vertex without intravenous contrast.  COMPARISON:  None.  FINDINGS: Age related cerebral atrophy, ventriculomegaly and periventricular white matter disease. The temporal horn of the left lateral ventricle is dilated. Suspect remote temporal lobe infarct with encephalomalacia. No extra-axial fluid collections are identified. No CT findings for acute hemispheric infarction or intracranial hemorrhage. No mass lesions.  The bony structures are intact. No acute fracture. The paranasal sinuses and mastoid air cells are grossly clear. The globes are intact.  IMPRESSION: Remote left temporal lobe infarct with encephalomalacia and ex vacuo dilatation of the temporal horn.  Age related cerebral atrophy, ventriculomegaly and periventricular white matter disease.  No acute intracranial findings or mass lesion.   Electronically Signed   By: Loralie Champagne M.D.   On: 01/18/2013 21:59   Ct Chest Wo Contrast  01/17/2013   CLINICAL DATA:  Preop for a left ventricular assist device placement.  EXAM: CT  CHEST, ABDOMEN AND PELVIS WITHOUT CONTRAST  TECHNIQUE: Multidetector CT imaging of the chest, abdomen and pelvis was performed following the standard protocol without IV contrast.  COMPARISON:  None  FINDINGS: CT CHEST FINDINGS  The chest wall demonstrates a left-sided permanent pacemaker and a right-sided PICC line. No supraclavicular or axillary mass or adenopathy. The bony thorax is intact. No destructive bone lesions or spinal canal compromise.  The heart is enlarged with a markedly enlarged left ventricle in particular. No pericardial effusion. No mediastinal or hilar lymphadenopathy. Small scattered lymph nodes are noted. Three-vessel coronary  artery calcifications are noted.  The lungs demonstrate bibasilar atelectasis and small, right greater than left pleural effusions. No worrisome pulmonary nodules or masses. No bronchiectasis.  CT ABDOMEN AND PELVIS FINDINGS  The liver is grossly normal without IV contrast. No focal hepatic lesions. Gallstones are noted in the gallbladder. No common bile duct dilatation. The pancreas is normal. The spleen is normal. There is a large right-sided abdominal fluid collection of uncertain significance or etiology. It was present on a prior chest CT from 2011. It may be a benign omental or peritoneal inclusion cyst. The adrenal glands and kidneys are normal except for bilateral renal cysts. No renal calculi or hydronephrosis. No ureteral dilatation.  The stomach, duodenum, small bowel and colon are grossly normal. No inflammatory changes or mass lesions. Moderate diverticulosis involving the sigmoid colon with associated muscular wall thickening. No mesenteric or retroperitoneal mass or adenopathy. Small scattered lymph nodes are noted. The aorta is normal in caliber.  The bladder demonstrates diffuse irregular wall thickening. No discrete mass. The prostate gland is enlarged and the seminal vesicles are normal. No pelvic mass, adenopathy or free pelvic fluid collections. No inguinal mass or adenopathy. Moderate fat noted in the inguinal canals.  The bony structures are unremarkable.  IMPRESSION: CT CHEST IMPRESSION  1. Cardiac enlargement with a markedly enlarged left ventricle. 2. Bibasilar atelectasis and small effusions.  CT ABDOMEN AND PELVIS IMPRESSION  1. Cholelithiasis. 2. Large right-sided abdominal fluid collection or cyst of uncertain significance or etiology but no change since prior examinations. 3. No acute abdominal/ pelvic findings, mass lesions or adenopathy.   Electronically Signed   By: Loralie Champagne M.D.   On: 01/17/2013 22:02   Dg Chest Port 1 View  01/17/2013   *RADIOLOGY REPORT*  Clinical Data:  Confirm right arm PICC line placement  PORTABLE CHEST - 1 VIEW  Comparison: 01/15/2013  Findings: Right-sided PICC line projects over the vicinity of the cavoatrial junction.  No pneumothorax.  Stable cardiac enlargement and cardiac pacer.  No abnormal opacities.  Right nipple shadow identified.  This is stable from multiple prior studies including 01/10/2013 and 12-24-12.  IMPRESSION: Right PICC line with tip over the vicinity of the cavoatrial junction.   Original Report Authenticated By: Esperanza Heir, M.D.     Medications:     Current Medications: . allopurinol  100 mg Oral QPM  . amiodarone  200 mg Oral BID  . atorvastatin  20 mg Oral Daily  . bisacodyl  10 mg Oral Once  . digoxin  0.0625 mg Oral QPM  . donepezil  10 mg Oral Daily  . polyethylene glycol-electrolytes  2,000 mL Oral Once  . sodium chloride  10-40 mL Intracatheter Q12H  . sodium chloride  3 mL Intravenous Q12H    Infusions: . sodium chloride Stopped (01/19/13 0500)  . sodium chloride 15 mL/hr at 01/18/13 2217  . milrinone 0.25 mcg/kg/min (01/18/13 1900)  Assessment:   1. A/C systolic HF with biventricular dysfunction and cardiogenic shock 2. Severe mitral regurgitation 3. Recurrent AF - now in SR after DC-CV 4. A/c renal failure, stage IV - improving with milrinone 5. 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 Hematuria  Plan/Discussion:    Hemodynamically stable overnight and Cr remains stable. Going for endo/colonscopy today and then hopefully home tomorrow with milrinone. Prealbumin low, continue nutrition per pharmacy and ambulating with PT and CR.   Weight remains stable and CVP still low 2-4 will continue to hold diuretics. SBP 130-140s will add low dose lisinopril 2.5 mg daily.   Maintaining SR on amio, will continue. Stop heparin on call to Endo and then will restart Eliquis 5 mg BID. Plan is to discontinue eliquis until evening Sunday Oct 12th and then planned admission on Oct 14th for LVAD implant on Oct  16th.  Can transfer to step down.   Ulla Potash B NP-C 7:08 AM Length of Stay: 9 Advanced Heart Failure Team Pager [phone] (M-F; 7a - 4p)  Please contact [facility] for night-coverage after hours (4p -7a ) and weekends on [email or web address]  Patient seen and examined with Ulla Potash, NP. We discussed all aspects of the encounter. I agree with the assessment and plan as stated above. Doing well for endo today. CVP remains low. Remains in NSR. Ambulating more. Agree with management as above. Would watch renal function closely on ACE-I given recent renal failure.   Kelley Knoth,MD 8:30 AM

## 2013-01-19 NOTE — Evaluation (Signed)
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 Alanda Amass since 2001. Admitted with AF, cardiogenic shock EF 20% with severe MR. Underwent DC-CV.  Pt with planned admission 10/14 for LVAD implantation on 10/16.  Clinical Impression  Pt functioning at supervision level however requires use of RW for safe ambulation. Pt with planned admission on 10/14 for 10/16 LVAD implantation. Pt safe to to d/c home with spouse and use of RW for ambulation once medically stable.    PT Assessment  Patient needs continued PT services    Follow Up Recommendations  No PT follow up;Supervision/Assistance - 24 hour    Does the patient have the potential to tolerate intense rehabilitation      Barriers to Discharge        Equipment Recommendations  Rolling walker with 5" wheels    Recommendations for Other Services     Frequency Min 3X/week    Precautions / Restrictions Precautions Precautions: Fall Restrictions Weight Bearing Restrictions: No   Pertinent Vitals/Pain Denies pain      Mobility  Bed Mobility Bed Mobility: Supine to Sit;Sitting - Scoot to Edge of Bed Supine to Sit: 4: Min guard;With rails;HOB elevated Sitting - Scoot to Edge of Bed: 4: Min guard;With rail Details for Bed Mobility Assistance: definite use of bed rails, increased time Transfers Transfers: Sit to Stand;Stand to Sit Sit to Stand: With upper extremity assist;From bed;4: Min guard Stand to Sit: 4: Min guard;With upper extremity assist;To chair/3-in-1 Details for Transfer Assistance: v/c's for safe hand placemet Ambulation/Gait Ambulation/Gait Assistance: 4: Min  guard Ambulation Distance (Feet): 150 Feet Assistive device: Rolling walker Ambulation/Gait Assistance Details: attempted amb without RW however pt unsteady and reaching for object/hallway rail to hold onto. Pt with increased comfort and stability with RW. Gait Pattern: Step-through pattern;Decreased stride length;Narrow base of support Gait velocity: wfl Stairs: Yes Stairs Assistance: 4: Min guard Stair Management Technique: One rail Left;Step to pattern Number of Stairs: 2 (limited by IV pole)    Exercises     PT Diagnosis: Generalized weakness;Difficulty walking  PT Problem List: Decreased strength;Decreased activity tolerance;Decreased balance;Decreased mobility PT Treatment Interventions: DME instruction;Gait training;Stair training;Functional mobility training;Therapeutic activities;Therapeutic exercise     PT Goals(Current goals can be found in the care plan section) Acute Rehab PT Goals Patient Stated Goal: home PT Goal Formulation: With patient Time For Goal Achievement: 01/26/13 Potential to Achieve Goals: Good  Visit Information  Last PT Received On: 01/19/13 Assistance Needed: +1 History of Present Illness: 72 year old male with 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 Alanda Amass since 2001. Admitted with AF, cardiogenic shock EF 20% with severe MR. Underwent DC-CV.  Pt with planned admission 10/14 for LVAD implantation on 10/16.       Prior Functioning  Home Living Family/patient expects to be discharged to:: Private residence Living Arrangements: Spouse/significant other Available Help at Discharge: Family;Available 24 hours/day Type of Home: House Home Access: Stairs to enter [employer] of Steps: 4 Entrance Stairs-Rails: Left Home Layout: One level Home Equipment: None Additional Comments: pt reports in one month they are moving to another home that  has no stairs Prior Function Level of Independence: Independent Communication Communication: No difficulties Dominant Hand: Right    Cognition  Cognition Arousal/Alertness: Awake/alert Behavior During Therapy: WFL for tasks assessed/performed Overall Cognitive Status: Within Functional Limits for tasks assessed    Extremity/Trunk Assessment Upper Extremity Assessment Upper Extremity Assessment: Overall WFL for tasks assessed Lower Extremity Assessment Lower Extremity Assessment: Generalized weakness Cervical / Trunk Assessment Cervical / Trunk Assessment: Kyphotic   Balance    End of Session PT - End of Session Equipment Utilized During Treatment: Gait belt Activity Tolerance: Patient tolerated treatment well Patient left: in chair;with call bell/phone within reach Nurse Communication: Mobility status  GP     Marcene Brawn 01/19/2013, 8:23 AM  Lewis Shock, PT, DPT Pager #: [phone] Office #: [phone]

## 2013-01-19 NOTE — Op Note (Signed)
[facility] [address] [place], [address]   ENDOSCOPY PROCEDURE REPORT  PATIENT: Nathan Mckinney, Nathan Mckinney  MR#: [id number] BIRTHDATE: November 19, 1940 , 72  yrs. old GENDER: Male ENDOSCOPIST: Beverley Fiedler, MD REFERRED BY:  Dolores Patty, M.D. PROCEDURE DATE:  01/19/2013 PROCEDURE:  EGD, diagnostic ASA CLASS:     Class IV INDICATIONS:  CHF, pre-LVAD screening, history of hiatus hernia  MEDICATIONS: These medications were titrated to patient response per physician's verbal order, Versed 6 mg IV, and Fentanyl 75 mcg IV TOPICAL ANESTHETIC: Cetacaine Spray  DESCRIPTION OF PROCEDURE: After the risks benefits and alternatives of the procedure were thoroughly explained, informed consent was obtained.  The Pentax Gastroscope [id number] endoscope was introduced through the mouth and advanced to the second portion of the duodenum. Without limitations.  The instrument was slowly withdrawn as the mucosa was fully examined.      The upper, middle and distal third of the esophagus were carefully inspected and no abnormalities were noted.  The z-line was well seen at the GEJ.  The endoscope was pushed into the fundus which was normal including a retroflexed view.  The antrum, gastric body, first and second part of the duodenum were unremarkable. Retroflexed views revealed no abnormalities.     The scope was then withdrawn from the patient and the procedure completed.  COMPLICATIONS: There were no complications.  ENDOSCOPIC IMPRESSION: Normal EGD  RECOMMENDATIONS: Colonoscopy today  eSigned:  Beverley Fiedler, MD 01/19/2013 12:08 PM   CC:The Patient

## 2013-01-19 NOTE — Progress Notes (Signed)
Subjective: No complaints.  Was up most of the night drinking the colon prep.  Objective: Vital signs in last 24 hours: Temp:  [98 F (36.7 C)-99.2 F (37.3 C)] 99.2 F (37.3 C) (10/01 0733) Pulse Rate:  [79-106] 96 (10/01 0733) Resp:  [15-24] 24 (10/01 0700) BP: (94-151)/(58-95) 126/70 mmHg (10/01 0700) SpO2:  [92 %-97 %] 92 % (10/01 0733) Weight:  [165 lb 9.1 oz (75.1 kg)] 165 lb 9.1 oz (75.1 kg) (10/01 0500) Last BM Date: 01/18/13  Intake/Output from previous day: 09/30 0701 - 10/01 0700 In: 1496.1 [P.O.:580; I.V.:916.1] Out: 652 [Urine:651; Stool:1] Intake/Output this shift:    Medications Current Facility-Administered Medications  Medication Dose Route Frequency Provider Last Rate Last Dose  . 0.9 %  sodium chloride infusion  250 mL Intravenous Continuous Marykay Lex, MD   250 mL at 01/18/13 1900  . 0.9 %  sodium chloride infusion   Intravenous Continuous PRN Dolores Patty, MD 15 mL/hr at 01/18/13 2217    . acetaminophen (TYLENOL) tablet 650 mg  650 mg Oral Q4H PRN Lynden Oxford, MD   650 mg at 01/18/13 0414  . allopurinol (ZYLOPRIM) tablet 100 mg  100 mg Oral QPM Lynden Oxford, MD   100 mg at 01/17/13 1811  . amiodarone (PACERONE) tablet 200 mg  200 mg Oral BID Lynden Oxford, MD   200 mg at 01/18/13 2213  . atorvastatin (LIPITOR) tablet 20 mg  20 mg Oral Daily Lynden Oxford, MD   20 mg at 01/18/13 1013  . bisacodyl (DULCOLAX) EC tablet 10 mg  10 mg Oral Once Dianah Field, PA-C      . digoxin (LANOXIN) tablet 0.0625 mg  0.0625 mg Oral QPM Mihai Croitoru, MD   0.0625 mg at 01/17/13 1811  . donepezil (ARICEPT) tablet 10 mg  10 mg Oral Daily Lynden Oxford, MD   10 mg at 01/18/13 1013  . hydrocortisone cream 1 % 1 application  1 application Topical TID PRN Marykay Lex, MD      . lisinopril (PRINIVIL,ZESTRIL) tablet 2.5 mg  2.5 mg Oral Daily Aundria Rud, NP      . milrinone [facility]) infusion 200 mcg/mL (0.2 mg/ml)  0.25 mcg/kg/min Intravenous Continuous Dolores Patty, MD 5.7 mL/hr at 01/18/13 1900 0.25 mcg/kg/min at 01/18/13 1900  . ondansetron (ZOFRAN) injection 4 mg  4 mg Intravenous Q6H PRN Lynden Oxford, MD   4 mg at 01/12/13 2137  . polyethylene glycol-electrolytes (NuLYTELY/GoLYTELY) solution 2,000 mL  2,000 mL Oral Once Dianah Field, PA-C      . sodium chloride 0.9 % injection 10-40 mL  10-40 mL Intracatheter Q12H Aundria Rud, NP   10 mL at 01/18/13 2100  . sodium chloride 0.9 % injection 10-40 mL  10-40 mL Intracatheter PRN Aundria Rud, NP      . sodium chloride 0.9 % injection 3 mL  3 mL Intravenous Q12H Marykay Lex, MD   3 mL at 01/16/13 1000    PE: General appearance: alert, cooperative and no distress Lungs: clear to auscultation bilaterally Heart: regular rate and rhythm and 2/6 sys MM in the axillae. Extremities: No LEE Pulses: 2+ and symmetric Skin: Warm and dry Neurologic: Grossly normal  Lab Results:   Recent Labs  01/19/13 0500  WBC 7.3  HGB 12.8*  HCT 37.6*  PLT 134*   BMET  Recent Labs  01/17/13 0725 01/18/13 0359 01/19/13 0500  NA 138 137 138  K 4.1 4.0 4.2  CL 101 102 105  CO2 27 26 25   GLUCOSE 89 104* 82  BUN 15 14 12   CREATININE 1.45* 1.31 1.33  CALCIUM 8.6 8.4 8.6    Assessment/Plan   Principal Problem:   Acute on chronic systolic heart failure- (doesn't tolerate AF) Active Problems:   CAD- non obstructive 2004, and 2013 [facility])   Nonischemic cardiomyopathy,  EF 35-40% by echo 07/2012.  -- now 20-25% 12/2012   VENTRICULAR TACHYCARDIA- on Amiodarone   Atrial fibrillation, persistent, recurrent   CVA 2004   SLEEP DISORDER   ICD '04, upgraded to BiV ICD 2/10 and 11/13- ? 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  LVAD implant in the near future.  Net fluids:  +0.84L/-1.5L.  BP stable.  NSR.  Milrinone with plans to DC home on it.     LOS: 9 days    HAGER, BRYAN 01/19/2013 7:58 AM   Patient seen and examined. Agree with assessment and plan. Continues to be stable hemodynamically on milrinone. Maintaining sinus rhythm without further AF. Bowel prep done for planned colonoscopy and EGD today, + fluid balance yesterday as a result. Wt 165 today. Plan for ultimate LVAD tentatively planned for 02/03/13.    Lennette Bihari, MD, [facility] 01/19/2013 8:34 AM

## 2013-01-20 ENCOUNTER — Encounter: Payer: Self-pay | Admitting: Internal Medicine

## 2013-01-20 LAB — BASIC METABOLIC PANEL
BUN: 10 mg/dL (ref 6–23)
Chloride: 108 mEq/L (ref 96–112)
GFR calc Af Amer: 71 mL/min — ABNORMAL LOW (ref >90)
GFR calc non Af Amer: 61 mL/min — ABNORMAL LOW (ref >90)
Potassium: 4.3 mEq/L (ref 3.5–5.1)
Sodium: 140 mEq/L (ref 135–145)

## 2013-01-20 LAB — URINALYSIS, ROUTINE W REFLEX MICROSCOPIC
Hgb urine dipstick: NEGATIVE
Ketones, ur: 15 mg/dL — AB
Leukocytes, UA: NEGATIVE
Nitrite: NEGATIVE
Protein, ur: NEGATIVE mg/dL
Urobilinogen, UA: 0.2 mg/dL (ref 0.0–1.0)
pH: 5 (ref 5.0–8.0)

## 2013-01-20 LAB — CARBOXYHEMOGLOBIN
Carboxyhemoglobin: 2 % — ABNORMAL HIGH (ref 0.5–1.5)
Methemoglobin: 0.7 % (ref 0.0–1.5)
O2 Saturation: 82 %

## 2013-01-20 LAB — HEPATIC FUNCTION PANEL
AST: 43 U/L — ABNORMAL HIGH (ref 0–37)
Albumin: 2.8 g/dL — ABNORMAL LOW (ref 3.5–5.2)
Alkaline Phosphatase: 91 U/L (ref 39–117)
Bilirubin, Direct: 0.3 mg/dL (ref 0.0–0.3)
Indirect Bilirubin: 1.1 mg/dL — ABNORMAL HIGH (ref 0.3–0.9)
Total Bilirubin: 1.4 mg/dL — ABNORMAL HIGH (ref 0.3–1.2)

## 2013-01-20 LAB — CBC
Hemoglobin: 13.4 g/dL (ref 13.0–17.0)
MCH: 30.1 pg (ref 26.0–34.0)
MCHC: 34.2 g/dL (ref 30.0–36.0)
Platelets: 138 10*3/uL — ABNORMAL LOW (ref 150–400)

## 2013-01-20 LAB — LACTATE DEHYDROGENASE: LDH: 240 U/L (ref 94–250)

## 2013-01-20 LAB — TSH: TSH: 5.566 u[IU]/mL — ABNORMAL HIGH (ref 0.350–4.500)

## 2013-01-20 LAB — HEPATITIS B SURFACE ANTIBODY,QUALITATIVE: Hep B S Ab: NEGATIVE

## 2013-01-20 LAB — HEPATITIS C ANTIBODY: HCV Ab: NEGATIVE

## 2013-01-20 LAB — HIV ANTIBODY (ROUTINE TESTING W REFLEX): HIV: NONREACTIVE

## 2013-01-20 NOTE — Progress Notes (Signed)
Met with Nathan Mckinney at bedside to offer [facility] Care Management services. He confirms his PCP is Dr Susann Givens. Explained services and patient reports he has received [facility] Care Management literature in the mail. States he will like to speak with his wife before consenting to services. Will come back at later time. Left packet at bedside with patient for him and his wife to review.  [name], MSN, RN, BSN- [facility] Liaison [phone]

## 2013-01-20 NOTE — Progress Notes (Signed)
Subjective: No complaints.  Objective: Vital signs in last 24 hours: Temp:  [97.2 F (36.2 C)-98 F (36.7 C)] 97.7 F (36.5 C) (10/02 0803) Pulse Rate:  [83-94] 94 (10/02 0753) Resp:  [14-27] 26 (10/02 0800) BP: (112-149)/(57-114) 124/80 mmHg (10/02 0800) SpO2:  [92 %-96 %] 93 % (10/02 0753) Weight:  [163 lb 12.8 oz (74.3 kg)] 163 lb 12.8 oz (74.3 kg) (10/02 0500) Last BM Date: 01/19/13  Intake/Output from previous day: 10/01 0701 - 10/02 0700 In: 519.8 [P.O.:20; I.V.:499.8] Out: 1525 [Urine:1525] Intake/Output this shift: Total I/O In: 20.7 [I.V.:20.7] Out: 200 [Urine:200]  Medications Current Facility-Administered Medications  Medication Dose Route Frequency Provider Last Rate Last Dose  . 0.9 %  sodium chloride infusion  250 mL Intravenous Continuous Marykay Lex, MD   250 mL at 01/18/13 1900  . 0.9 %  sodium chloride infusion   Intravenous Continuous PRN Dolores Patty, MD 15 mL/hr at 01/20/13 0800    . acetaminophen (TYLENOL) tablet 650 mg  650 mg Oral Q4H PRN Lynden Oxford, MD   650 mg at 01/19/13 1509  . allopurinol (ZYLOPRIM) tablet 100 mg  100 mg Oral QPM Lynden Oxford, MD   100 mg at 01/19/13 1805  . amiodarone (PACERONE) tablet 200 mg  200 mg Oral BID Lynden Oxford, MD   200 mg at 01/19/13 2150  . apixaban (ELIQUIS) tablet 5 mg  5 mg Oral BID Dolores Patty, MD   5 mg at 01/19/13 2041  . atorvastatin (LIPITOR) tablet 20 mg  20 mg Oral Daily Lynden Oxford, MD   20 mg at 01/19/13 0903  . bisacodyl (DULCOLAX) EC tablet 10 mg  10 mg Oral Once Dianah Field, PA-C      . digoxin (LANOXIN) tablet 0.0625 mg  0.0625 mg Oral QPM Mihai Croitoru, MD   0.0625 mg at 01/19/13 1804  . donepezil (ARICEPT) tablet 10 mg  10 mg Oral Daily Lynden Oxford, MD   10 mg at 01/19/13 0903  . hydrocortisone cream 1 % 1 application  1 application Topical TID PRN Marykay Lex, MD      . lisinopril (PRINIVIL,ZESTRIL) tablet 2.5 mg  2.5 mg Oral Daily Aundria Rud, NP   2.5 mg at  01/19/13 0903  . milrinone (PRIMACOR) infusion 200 mcg/mL (0.2 mg/ml)  0.25 mcg/kg/min Intravenous Continuous Bevelyn Buckles Bensimhon, MD 5.7 mL/hr at 01/20/13 0800 0.25 mcg/kg/min at 01/20/13 0800  . ondansetron (ZOFRAN) injection 4 mg  4 mg Intravenous Q6H PRN Lynden Oxford, MD   4 mg at 01/12/13 2137  . polyethylene glycol-electrolytes (NuLYTELY/GoLYTELY) solution 2,000 mL  2,000 mL Oral Once Dianah Field, PA-C      . sodium chloride 0.9 % injection 10-40 mL  10-40 mL Intracatheter Q12H Aundria Rud, NP   10 mL at 01/19/13 0903  . sodium chloride 0.9 % injection 10-40 mL  10-40 mL Intracatheter PRN Aundria Rud, NP      . sodium chloride 0.9 % injection 3 mL  3 mL Intravenous Q12H Marykay Lex, MD   3 mL at 01/19/13 2150    PE: General appearance: alert, cooperative and no distress  Lungs: clear to auscultation bilaterally  Heart: regular rate and rhythm and 2/6 sys MM in the axillae.  Extremities: No LEE  Pulses: 2+ and symmetric  Skin: Warm and dry  Neurologic: Grossly normal   Lab Results:   Recent Labs  01/19/13 0500 01/20/13 0500  WBC 7.3 7.2  HGB 12.8* 13.4  HCT 37.6* 39.2  PLT 134* 138*   BMET  Recent Labs  01/18/13 0359 01/19/13 0500 01/20/13 0500  NA 137 138 140  K 4.0 4.2 4.3  CL 102 105 108  CO2 26 25 22   GLUCOSE 104* 82 93  BUN 14 12 10   CREATININE 1.31 1.33 1.15  CALCIUM 8.4 8.6 8.5    Assessment/Plan   Principal Problem:   Acute on chronic systolic heart failure- (doesn't tolerate AF) Active Problems:   CAD- non obstructive 2004, and 2013 [facility])   Nonischemic cardiomyopathy,  EF 35-40% by echo 07/2012.  -- now 20-25% 12/2012   VENTRICULAR TACHYCARDIA- on Amiodarone   Atrial fibrillation, persistent, recurrent   CVA 2004   SLEEP DISORDER   ICD '04, upgraded to BiV ICD 2/10 and 11/13- ? 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 Net fluids: -1.0L/-2.5L. BP stable. NSR.  Milrinone 0.73mcg/kg/min with plans to DC home on it.   BP and SCr stable.   S/P EGD-Normal and Colonoscopy-70mm polyp removed, moderate diverticulosis.  LVAD in the near future.  Meds:  Amio 200, Eliquis, lipitor, digoxin 0.625, lisinopril 2.5.  PT recommends 24 hr supervision but no PT followup.      LOS: 10 days    Ermalinda Joubert 01/20/2013 8:22 AM

## 2013-01-20 NOTE — Progress Notes (Signed)
Reviewed procedures with pt today. 1 colon polyp removed, 3 mm, and found to be tubular adenoma. Per guidelines, repeat screening/surveillance would be 5 years based on overall health at that time. He voiced understanding Plan for LVAD later this month and I wished him good luck

## 2013-01-20 NOTE — Progress Notes (Signed)
CARDIAC REHAB PHASE I   PRE:  Rate/Rhythm: 91 pacing    BP: sitting 121/77    SaO2: 93 RA  MODE:  Ambulation: 350 ft   POST:  Rate/Rhythm: 102 pacing    BP: sitting 137/78     SaO2: 94 RA  Pt with quick pace at first, not controlled. Encouraged pt to slow down. More controlled the rest of walk, steadier. Used RW. To BSC then recliner after walk. VSS. No c/o.  [id number]  Elissa Lovett [place] CES, ACSM 01/20/2013 1:42 PM

## 2013-01-20 NOTE — Progress Notes (Signed)
received pt from stepdown AAOx3 assisted pt to bed. Oriented to bed and call bell. Tele on. Vpaced. Continue to monitor pt.

## 2013-01-20 NOTE — Progress Notes (Signed)
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 Alanda Amass since 2001. Admitted with AF, cardiogenic shock EF 20% with severe MR. Underwent DC-CV.   Colonoscopy/EDG done yesterday. One colon polyp o/w normal.  Remains on milrinone 0.25. PFTs look great. Much more alert today. Walking. Feels good. No orthopnea/PND. Presented at [facility] transplant listing conference today. Approved for DT VAD.   Co-ox- 82 CVP 2  Objective:    Vital Signs:   Temp:  [97.2 F (36.2 C)-98 F (36.7 C)] 97.7 F (36.5 C) (10/02 0803) Pulse Rate:  [83-94] 93 (10/02 0800) Resp:  [14-27] 26 (10/02 0800) BP: (112-149)/(57-114) 124/80 mmHg (10/02 0800) SpO2:  [92 %-96 %] 93 % (10/02 0753) Weight:  [74.3 kg (163 lb 12.8 oz)] 74.3 kg (163 lb 12.8 oz) (10/02 0500) Last BM Date: 01/19/13  Weight change: Filed Weights   01/18/13 0500 01/19/13 0500 01/20/13 0500  Weight: 74.7 kg (164 lb 10.9 oz) 75.1 kg (165 lb 9.1 oz) 74.3 kg (163 lb 12.8 oz)    Intake/Output:   Intake/Output Summary (Last 24 hours) at 01/20/13 0858 Last data filed at 01/20/13 0800  Gross per 24 hour  Intake  519.8 ml  Output   1725 ml  Net -1205.2 ml     Physical Exam: General:  Comfortable. Sitting in chair HEENT: normal Neck: supple. JVP flat.. Carotids 2+ bilat; no bruits. No lymphadenopathy or thryomegaly appreciated. Cor: PMI laterally displaced. Regular rate & rhythm.2/6 MR. + s3 Lungs: clear Abdomen: soft, nontender, non distended. No hepatosplenomegaly. No bruits or masses. Good bowel sounds. Extremities: no cyanosis, clubbing, rash, edema RUE PICC Neuro: alert & orientedx3, cranial nerves grossly intact. moves all  4 extremities w/o difficulty. Affect pleasant  Telemetry: SR with v-pacing  Labs: Basic Metabolic Panel:  Recent Labs Lab 01/16/13 0412 01/17/13 0725 01/18/13 0359 01/19/13 0500 01/20/13 0500  NA 140 138 137 138 140  K 3.6 4.1 4.0 4.2 4.3  CL 105 101 102 105 108  CO2 25 27 26 25 22   GLUCOSE 79 89 104* 82 93  BUN 26* 15 14 12 10   CREATININE 1.68* 1.45* 1.31 1.33 1.15  CALCIUM 8.1* 8.6 8.4 8.6 8.5    Liver Function Tests: No results found for this basename: AST, ALT, ALKPHOS, BILITOT, PROT, ALBUMIN,  in the last 168 hours No results found for this basename: LIPASE, AMYLASE,  in the last 168 hours No results found for this basename: AMMONIA,  in the last 168 hours  CBC:  Recent Labs Lab 01/15/13 0500 01/19/13 0500 01/20/13 0500  WBC 6.7 7.3 7.2  HGB 13.3 12.8* 13.4  HCT 39.1 37.6* 39.2  MCV 87.5 86.6 88.1  PLT 162 134* 138*    Cardiac Enzymes: No results found for this basename: CKTOTAL, CKMB, CKMBINDEX, TROPONINI,  in the last 168 hours  BNP: BNP (last 3 results)  Recent Labs  11/30/12 0455 01/10/13 2204 01/11/13 1158  PROBNP 29987.0* 27226.0* 21360.0*    CBG:  Recent Labs Lab 01/17/13 0845  GLUCAP 134*    Coagulation Studies: No results found for this basename: LABPROT, INR,  in the last 72 hours  Other results: EKG: SR with v pacing at 71  Imaging:  Ct Head Wo Contrast  01/18/2013   CLINICAL DATA:  Pre LVAD placement.  EXAM: CT HEAD WITHOUT CONTRAST  TECHNIQUE: Contiguous axial images were obtained from the base of the skull through the vertex without intravenous contrast.  COMPARISON:  None.  FINDINGS: Age related cerebral atrophy, ventriculomegaly and periventricular white matter disease. The temporal horn of the left lateral ventricle is dilated. Suspect remote temporal lobe infarct with encephalomalacia. No extra-axial fluid collections are identified. No CT findings for acute hemispheric infarction or intracranial hemorrhage. No mass  lesions.  The bony structures are intact. No acute fracture. The paranasal sinuses and mastoid air cells are grossly clear. The globes are intact.  IMPRESSION: Remote left temporal lobe infarct with encephalomalacia and ex vacuo dilatation of the temporal horn.  Age related cerebral atrophy, ventriculomegaly and periventricular white matter disease.  No acute intracranial findings or mass lesion.   Electronically Signed   By: Loralie Champagne M.D.   On: 01/18/2013 21:59     Medications:     Current Medications: . allopurinol  100 mg Oral QPM  . amiodarone  200 mg Oral BID  . apixaban  5 mg Oral BID  . atorvastatin  20 mg Oral Daily  . bisacodyl  10 mg Oral Once  . digoxin  0.0625 mg Oral QPM  . donepezil  10 mg Oral Daily  . lisinopril  2.5 mg Oral Daily  . polyethylene glycol-electrolytes  2,000 mL Oral Once  . sodium chloride  10-40 mL Intracatheter Q12H  . sodium chloride  3 mL Intravenous Q12H    Infusions: . sodium chloride Stopped (01/19/13 0500)  . sodium chloride 15 mL/hr at 01/20/13 0800  . milrinone 0.25 mcg/kg/min (01/20/13 0800)     Assessment:   1. A/C systolic HF with biventricular dysfunction and cardiogenic shock 2. Severe mitral regurgitation 3. Recurrent AF - now in SR after DC-CV 4. A/c renal failure, stage IV - improving with milrinone 5. 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 Hematuria  Plan/Discussion:    Doing great. CVP remains low off diuretics. Remains in NSR. Ambulating more. More alert. i suspect he will be great VAD candidate. Plan home tomorrow on milrinone and return in 2 weeks for VAD. Continue eliquis.    Daniel Bensimhon,MD 8:58 AM

## 2013-01-20 NOTE — Progress Notes (Signed)
CT surgery rounding note  Patient was presented this morning at [facility] Cardiac Transplant listing conference to review patient's candidacy as appropriate for DT LVAD therapy for refractory advanced heart failure. The transplant committee agreed patient was not a candidate for cardiac transplantation due to advanced age and comorbid medical problems. The committee felt the patient was  a candidate for mechanical circulatory assistance in the form of an implantable permanent LVAD. All of the committee's questions were addressed and a consensus for  DT LVAD was approved.

## 2013-01-21 LAB — LIPID PANEL
HDL: 44 mg/dL (ref >39)
Total CHOL/HDL Ratio: 2.3 RATIO
VLDL: 13 mg/dL (ref 0–40)

## 2013-01-21 LAB — BASIC METABOLIC PANEL
Calcium: 8.5 mg/dL (ref 8.4–10.5)
Chloride: 104 mEq/L (ref 96–112)
Creatinine, Ser: 1.43 mg/dL — ABNORMAL HIGH (ref 0.50–1.35)
GFR calc Af Amer: 54 mL/min — ABNORMAL LOW (ref >90)
GFR calc non Af Amer: 47 mL/min — ABNORMAL LOW (ref >90)
Glucose, Bld: 80 mg/dL (ref 70–99)
Potassium: 4 mEq/L (ref 3.5–5.1)
Sodium: 138 mEq/L (ref 135–145)

## 2013-01-21 LAB — CARBOXYHEMOGLOBIN
Methemoglobin: 1 % (ref 0.0–1.5)
Total hemoglobin: 13.3 g/dL — ABNORMAL LOW (ref 13.5–18.0)

## 2013-01-21 LAB — T4, FREE: Free T4: 1.28 ng/dL (ref 0.80–1.80)

## 2013-01-21 MED ORDER — FUROSEMIDE 40 MG PO TABS: 40.0000 mg | ORAL_TABLET | ORAL | Status: DC

## 2013-01-21 MED ORDER — AMIODARONE HCL 200 MG PO TABS: 200.0000 mg | ORAL_TABLET | Freq: Every day | ORAL | Status: DC

## 2013-01-21 MED ORDER — MILRINONE IN DEXTROSE 20 MG/100ML IV SOLN: 0.2500 ug/kg/min | INTRAVENOUS | Status: DC

## 2013-01-21 MED ORDER — ACETAMINOPHEN 325 MG PO TABS
650.0000 mg | ORAL_TABLET | ORAL | Status: AC
  Administered 2013-01-21: 10:00:00 650 mg via ORAL
  Filled 2013-01-21: qty 2

## 2013-01-21 MED ORDER — DIGOXIN 125 MCG PO TABS: 0.0625 mg | ORAL_TABLET | Freq: Every evening | ORAL | Status: DC

## 2013-01-21 MED ORDER — AMIODARONE HCL 200 MG PO TABS
200.0000 mg | ORAL_TABLET | Freq: Every day | ORAL | Status: DC
  Administered 2013-01-21: 10:00:00 200 mg via ORAL
  Filled 2013-01-21: qty 1

## 2013-01-21 NOTE — Progress Notes (Signed)
Pt awake, no c/o SOB nor chest pains . vpaced on the monitor,. Continued to monitor pt.

## 2013-01-21 NOTE — Progress Notes (Signed)
01/21/13 1130 Anticipate pt. to dc home today.  Cardiac rehabilitation recommends pt. to have DME rollator.  Will obtain order.  TC to Pam, infusion specialist with [facility], to make aware of possible dc today. Tera Mater, RN, BSN NCM [phone]

## 2013-01-21 NOTE — Progress Notes (Signed)
Pt. Seen and examined. Agree with the NP/PA-C note as written.  Stable on lasix. Plan to d/c home on milrinone per HF service. They are planning VAD (see Dr. Zenaida Niece Trigt's notes). Discussed with CHF service, they will assume care and facilitate discharge and follow-up.  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 Royann Shivers for general cardiology going forward due to his retirement.  Chrystie Nose, MD, [facility] Attending Cardiologist [facility]

## 2013-01-21 NOTE — Progress Notes (Signed)
CARDIAC REHAB PHASE I   PRE:  Rate/Rhythm: 85 pacing    BP: sitting     SaO2:   MODE:  Ambulation: 560 ft   POST:  Rate/Rhythm: 91    BP: sitting 117/69     SaO2: 98 RA  Pt performed 6 min walk test today. Even pace, steady with RW. Supervision assist. Did not need to rest, completed 560 ft. Sts he feels well. Expected fatigue after walk, to recliner.  Discussed with pt and son ex gl at home to increase strength and endurance. Recommend rollator. Notified CM. There is a question of how his rollator will go over gravel drive. Reviewed HF booklet, daily wts. Son is very attentive. Requests pt's height be measured to ensure accuracy. [id number]  Elissa Lovett [place] CES, ACSM 01/21/2013 11:12 AM

## 2013-01-21 NOTE — Plan of Care (Signed)
Problem: Discharge Progression Outcomes Goal: Able to perform self care activities Outcome: Adequate for Discharge With assist

## 2013-01-21 NOTE — Progress Notes (Signed)
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 Alanda Amass since 2001. Admitted with AF, cardiogenic shock EF 20% with severe MR. Underwent DC-CV.   Colonoscopy/EDG 01/19/13. One colon polyp o/w normal.  Remains on milrinone 0.25. PFTs look great. Walked with PT 150 ft with no SOB. More alert today. Feels good. No orthopnea/PND. Presented at [facility] transplant listing conference 01/20/13. Approved for DT VAD.   Cr up 1.4  Objective:    Vital Signs:   Temp:  [97.4 F (36.3 C)-98.6 F (37 C)] 98.6 F (37 C) (10/03 0835) Pulse Rate:  [79-91] 88 (10/03 0835) Resp:  [18-29] 22 (10/03 0835) BP: (92-131)/(52-78) 111/61 mmHg (10/03 0835) SpO2:  [92 %-98 %] 95 % (10/03 0835) Weight:  [171 lb 8.3 oz (77.8 kg)] 171 lb 8.3 oz (77.8 kg) (10/03 0444) Last BM Date: 01/20/13  Weight change: Filed Weights   01/19/13 0500 01/20/13 0500 01/21/13 0444  Weight: 165 lb 9.1 oz (75.1 kg) 163 lb 12.8 oz (74.3 kg) 171 lb 8.3 oz (77.8 kg)    Intake/Output:   Intake/Output Summary (Last 24 hours) at 01/21/13 0953 Last data filed at 01/21/13 0705  Gross per 24 hour  Intake  728.4 ml  Output    450 ml  Net  278.4 ml     Physical Exam: General:  Comfortable. Lying in bed HEENT: normal Neck: supple. JVP flat.. Carotids 2+ bilat; no bruits. No lymphadenopathy or thryomegaly appreciated. Cor: PMI laterally displaced. Regular rate & rhythm.2/6 MR. + s3 Lungs: clear Abdomen: soft, nontender, non distended. No hepatosplenomegaly. No bruits or masses. Good bowel sounds. Extremities: no cyanosis, clubbing, rash, edema RUE PICC Neuro: alert & orientedx3, cranial nerves grossly intact. moves all 4  extremities w/o difficulty. Affect pleasant  Telemetry: SR with v-pacing  Labs: Basic Metabolic Panel:  Recent Labs Lab 01/17/13 0725 01/18/13 0359 01/19/13 0500 01/20/13 0500 01/21/13 0540  NA 138 137 138 140 138  K 4.1 4.0 4.2 4.3 4.0  CL 101 102 105 108 104  CO2 27 26 25 22 25   GLUCOSE 89 104* 82 93 80  BUN 15 14 12 10 13   CREATININE 1.45* 1.31 1.33 1.15 1.43*  CALCIUM 8.6 8.4 8.6 8.5 8.5    Liver Function Tests:  Recent Labs Lab 01/20/13 0940  AST 43*  ALT 28  ALKPHOS 91  BILITOT 1.4*  PROT 6.3  ALBUMIN 2.8*   No results found for this basename: LIPASE, AMYLASE,  in the last 168 hours No results found for this basename: AMMONIA,  in the last 168 hours  CBC:  Recent Labs Lab 01/15/13 0500 01/19/13 0500 01/20/13 0500  WBC 6.7 7.3 7.2  HGB 13.3 12.8* 13.4  HCT 39.1 37.6* 39.2  MCV 87.5 86.6 88.1  PLT 162 134* 138*    Cardiac Enzymes: No results found for this basename: CKTOTAL, CKMB, CKMBINDEX, TROPONINI,  in the last 168 hours  BNP: BNP (last 3 results)  Recent Labs  11/30/12 0455 01/10/13 2204 01/11/13 1158  PROBNP 29987.0* 27226.0* 21360.0*    CBG:  Recent Labs Lab 01/17/13 0845  GLUCAP 134*    Coagulation Studies: No results found for this basename: LABPROT, INR,  in the last  72 hours  Other results: EKG: SR with v pacing at 71  Imaging: No results found.   Medications:     Current Medications: . allopurinol  100 mg Oral QPM  . amiodarone  200 mg Oral BID  . apixaban  5 mg Oral BID  . atorvastatin  20 mg Oral Daily  . bisacodyl  10 mg Oral Once  . digoxin  0.0625 mg Oral QPM  . donepezil  10 mg Oral Daily  . lisinopril  2.5 mg Oral Daily  . polyethylene glycol-electrolytes  2,000 mL Oral Once  . sodium chloride  10-40 mL Intracatheter Q12H    Infusions: . sodium chloride Stopped (01/19/13 0500)  . sodium chloride 15 mL/hr at 01/20/13 1900  . milrinone 0.25 mcg/kg/min (01/20/13 2053)     Assessment:   1.  A/C systolic HF with biventricular dysfunction and cardiogenic shock 2. Severe mitral regurgitation 3. Recurrent AF - now in SR after DC-CV 4. A/c renal failure, stage IV - improving with milrinone 5. 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 Hematuria  Plan/Discussion:    Transferred to telemetry yesterday. Walking well with PT and much more alert. Will ask IV team to come draw co-ox, still on milrinone 0.25 mcg. Cr slightly up today will follow closely. Hold diuretics today will restart tomorrow.   TSH elevated will get free T3 and T4 today. HIV, Hep B and Hep C negative.  SW finishing work-up for VAD implant. The goal is to send home today and then see back in clinic next Wednesday and then following week admit for LVAD 10/16. Patient has met with nutrition and is aware to eat more protein. Going home with [facility] and PT. Will order IS to work on before admission. Continue Eliquis.    Aundria Rud, NP-C 9:53 AM  Patient seen with NP, agree with the above note.    He is doing well today.  Stable on milrinone.  Creatinine up a bit after starting lisinopril but will just follow this for now.  Can restart Lasix at lower dose, 40 mg every other day start Sunday.  We will see him in clinic next week for ongoing LVAD workup.  Also, can decrease amiodarone to 200 mg daily.   Marca Ancona 01/21/2013 10:16 AM

## 2013-01-21 NOTE — Progress Notes (Signed)
Pt alert and oriented times 4, son at bedside, VAD education given by Comer Locket, [facility] Nurse in to teach family about Milrinone and home use of medication, son and wife at bedside during both education sessions, rolling walker given to patient  prior to discharge. Pt going home on Milrinone drip per MD orders connected to RUA PICC, discharge instructions given to patient and wife, both stated they understood, discharged by wheelchair by NT

## 2013-01-21 NOTE — Progress Notes (Signed)
Subjective: No complaints  Objective: Vital signs in last 24 hours: Temp:  [97.4 F (36.3 C)-98.6 F (37 C)] 98.6 F (37 C) (10/03 0835) Pulse Rate:  [79-91] 88 (10/03 0835) Resp:  [18-29] 22 (10/03 0835) BP: (92-131)/(52-78) 111/61 mmHg (10/03 0835) SpO2:  [92 %-98 %] 95 % (10/03 0835) Weight:  [171 lb 8.3 oz (77.8 kg)] 171 lb 8.3 oz (77.8 kg) (10/03 0444) Last BM Date: 01/20/13  Intake/Output from previous day: 10/02 0701 - 10/03 0700 In: 1109.1 [P.O.:840; I.V.:269.1] Out: 550 [Urine:550] Intake/Output this shift: Total I/O In: -  Out: 100 [Urine:100]  Medications Current Facility-Administered Medications  Medication Dose Route Frequency Provider Last Rate Last Dose  . 0.9 %  sodium chloride infusion  250 mL Intravenous Continuous Marykay Lex, MD   250 mL at 01/18/13 1900  . 0.9 %  sodium chloride infusion   Intravenous Continuous PRN Dolores Patty, MD 15 mL/hr at 01/20/13 1900    . acetaminophen (TYLENOL) tablet 650 mg  650 mg Oral Q4H PRN Lynden Oxford, MD   650 mg at 01/19/13 1509  . acetaminophen (TYLENOL) tablet 650 mg  650 mg Oral NOW Aundria Rud, NP      . allopurinol (ZYLOPRIM) tablet 100 mg  100 mg Oral QPM Lynden Oxford, MD   100 mg at 01/20/13 1738  . amiodarone (PACERONE) tablet 200 mg  200 mg Oral BID Lynden Oxford, MD   200 mg at 01/20/13 2221  . apixaban (ELIQUIS) tablet 5 mg  5 mg Oral BID Dolores Patty, MD   5 mg at 01/20/13 2221  . atorvastatin (LIPITOR) tablet 20 mg  20 mg Oral Daily Lynden Oxford, MD   20 mg at 01/20/13 0900  . bisacodyl (DULCOLAX) EC tablet 10 mg  10 mg Oral Once Dianah Field, PA-C      . digoxin (LANOXIN) tablet 0.0625 mg  0.0625 mg Oral QPM Mihai Croitoru, MD   0.0625 mg at 01/20/13 1738  . donepezil (ARICEPT) tablet 10 mg  10 mg Oral Daily Lynden Oxford, MD   10 mg at 01/20/13 0900  . hydrocortisone cream 1 % 1 application  1 application Topical TID PRN Marykay Lex, MD      . lisinopril (PRINIVIL,ZESTRIL)  tablet 2.5 mg  2.5 mg Oral Daily Aundria Rud, NP   2.5 mg at 01/20/13 0900  . milrinone [facility]) infusion 200 mcg/mL (0.2 mg/ml)  0.25 mcg/kg/min Intravenous Continuous Dolores Patty, MD 5.7 mL/hr at 01/20/13 2053 0.25 mcg/kg/min at 01/20/13 2053  . ondansetron (ZOFRAN) injection 4 mg  4 mg Intravenous Q6H PRN Lynden Oxford, MD   4 mg at 01/12/13 2137  . polyethylene glycol-electrolytes (NuLYTELY/GoLYTELY) solution 2,000 mL  2,000 mL Oral Once Dianah Field, PA-C      . sodium chloride 0.9 % injection 10-40 mL  10-40 mL Intracatheter Q12H Aundria Rud, NP   10 mL at 01/20/13 0901  . sodium chloride 0.9 % injection 10-40 mL  10-40 mL Intracatheter PRN Aundria Rud, NP        PE: General appearance: alert, cooperative and no distress  Lungs: clear to auscultation bilaterally  Heart: regular rate and rhythm and 1/6 sys MM in the axillae.  Extremities: No LEE  Pulses: 2+ and symmetric  Skin: Warm and dry  Neurologic: Grossly normal   Lab Results:   Recent Labs  01/19/13 0500 01/20/13 0500  WBC 7.3 7.2  HGB 12.8* 13.4  HCT  37.6* 39.2  PLT 134* 138*   BMET  Recent Labs  01/19/13 0500 01/20/13 0500 01/21/13 0540  NA 138 140 138  K 4.2 4.3 4.0  CL 105 108 104  CO2 25 22 25   GLUCOSE 82 93 80  BUN 12 10 13   CREATININE 1.33 1.15 1.43*  CALCIUM 8.6 8.5 8.5   PT/INR No results found for this basename: LABPROT, INR,  in the last 72 hours Cholesterol  Recent Labs  01/21/13 0540  CHOL 103   Lipid Panel     Component Value Date/Time   CHOL 103 01/21/2013 0540   TRIG 66 01/21/2013 0540   HDL 44 01/21/2013 0540   CHOLHDL 2.3 01/21/2013 0540   VLDL 13 01/21/2013 0540   LDLCALC 46 01/21/2013 0540    Assessment/Plan  Principal Problem:   Acute on chronic systolic heart failure- (doesn't tolerate AF) Active Problems:   CAD- non obstructive 2004, and 2013 [facility])   Nonischemic cardiomyopathy,  EF 35-40% by echo 07/2012.  -- now 20-25% 12/2012   VENTRICULAR  TACHYCARDIA- on Amiodarone   Atrial fibrillation, persistent, recurrent   CVA 2004   SLEEP DISORDER   ICD '04, upgraded to BiV ICD 2/10 and 11/13- ? 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 Net fluids: +0.56L/-1.9L. BP stable. NSR. Milrinone 0.23mcg/kg/min with plans to DC home on it. BP and SCr stable. S/P EGD-Normal and Colonoscopy-18mm polyp removed, moderate diverticulosis.  Not a heart transplant candidate. Meds: Amio 200, Eliquis, lipitor, digoxin 0.625, lisinopril 2.5. Daily weights and sodium restriction.   PT recommends 24 hr supervision but no PT followup.     LOS: 11 days    Geanie Pacifico 01/21/2013 10:01 AM

## 2013-01-21 NOTE — Discharge Summary (Signed)
Advanced Heart Failure Team  Discharge Summary   Patient ID: Nathan Mckinney MRN: [id number], DOB/AGE: 09-11-1940 72 y.o. Admit date: 01/10/2013 D/C date:     01/21/2013   Primary Discharge Diagnoses:  1) . A/C systolic HF with biventricular dysfunction and cardiogenic shock (upgraded to BiV ICD Medtronic 2013)  Secondary Discharge Diagnoses:  1. Severe mitral regurgitation  2. Recurrent AF - now in SR after DC-CV  3. A/c renal failure, stage IV - improving with milrinone  4. H/o CVA - no residual  5. Hematuria 6. Severe malnutrition, pre-albumin 14.9  Hospital Course:  Nathan Mckinney is a 72 year old male with long history of CHF due to dilated nonischemic cardiomyopathy (BiV ICD), VT, CKD stage III-IV (baseline cr 1.8-1.9), PAF, and stroke in 2001 who has been followed by Dr. Alanda Amass.  He has been suffering from end-stage HF over the past couple of months and was hospitalized in [place], [place] for acute exacerbation of CHF four months ago. During that admission he was noted to be in rapid afib and underwent DC cardioversion on 2 occasions, both of which were unsuccessful. He apparently had a cardiac catheterization performed at that time which revealed nonobstructive coronary artery disease and an echocardiogram that revealed severe global LV dysfunction with EF estimated 15-20% and moderate-severe mitral regurgitation. He was started on amiodarone and Eliquis at that time.   This admission is the 4th one in the past 4 months for recurrent acute combined systolic and diastolic congestive heart failure. He presented to the ED with increased SOB and weight gain. It was felt that his HF had been further aggravated by the presence of recurrent persistent atrial fibrillation. EP was consulted and he was seen by RDR. Allred. RA lead found to be dislodged. Underwent DC-CV of AF but felt to be at high risk for reverting to AF due to severe LA dilation. He was placed on amiodarone. Dr. Cornelius Moras consulted for  possible MVR and Maze. Repeat ECHO showed EF 20-25% with with akinesis of inferolateral wall and tethering of posterior MV leaflet with severe posterior MR. The RV is also moderately HK.   He had a swan placed at bedside and his numbers were: RA 15  PA 54/30 (39)  PCWP ~31 (v=45) - difficult wave form due to severe v-waves  Fick CO/CI = 2.8/1.4  Thermo CO/CI 2.4/1.2  PVR 3.4 WU  He was started on milrinone and IV lasix and his BB and ACE-I were discontinued due to shock. With milrinone his Cr continued to improved and it helped facilitate in his diuresis. Dr. Gala Romney met with family to discuss options and they were interested in anything that would improve his quality of life. Discussions were made with the surgeons and cardiologists and it was felt given long-standing severe LV dysfunction, that the best option was VAD.  He started the LVAD work-up and met with a LVAD patient, social worker, palliative care, nutrition, PT and had all the pre-implant tests and lab work done while here. It was felt that he was a great candidate for LVAD placement.   He diuresed a total of 1.7 liters and today on discharge was still dry. We will start his diuretics back on Sunday at a dose of 40 mg QOD. He was able to tolerate re-initiation of low dose lisinopril 2.5 mg daily. He maintained SR during the whole visit and today Amiodarone was cut back to 200 mg daily. On discharge able to ambulate in the halls with no SOB. VSS. Will send home  to work with PT until planned VAD, hopefully on Oct 16th insurance confirmation pending. Has an IS to work with to prepare for surgery and instructed to continue to drink ensures and eat good protein to help with nutrition. Will be followed closely in the HF clinic. Discharge weight 171 lbs.   Will need BMET and digoxin level at follow up.    Discharge Weight Range: 171-174 lbs Discharge Vitals: Blood pressure 125/70, pulse 79, temperature 97.9 F (36.6 C), temperature source  Oral, resp. rate 20, height 5\' 11"  (1.803 m), weight 171 lb 8.3 oz (77.8 kg), SpO2 98.00%.  Labs: Lab Results  Component Value Date   WBC 7.2 01/20/2013   HGB 13.4 01/20/2013   HCT 39.2 01/20/2013   MCV 88.1 01/20/2013   PLT 138* 01/20/2013     Recent Labs Lab 01/20/13 0940 01/21/13 0540  NA  --  138  K  --  4.0  CL  --  104  CO2  --  25  BUN  --  13  CREATININE  --  1.43*  CALCIUM  --  8.5  PROT 6.3  --   BILITOT 1.4*  --   ALKPHOS 91  --   ALT 28  --   AST 43*  --   GLUCOSE  --  80   Lab Results  Component Value Date   CHOL 103 01/21/2013   HDL 44 01/21/2013   LDLCALC 46 01/21/2013   TRIG 66 01/21/2013   BNP (last 3 results)  Recent Labs  11/30/12 0455 01/10/13 2204 01/11/13 1158  PROBNP 11914.7* 27226.0* 21360.0*    Diagnostic Studies/Procedures   No results found.  Discharge Medications     Medication List    STOP taking these medications       metoprolol 100 MG tablet  Commonly known as:  LOPRESSOR      TAKE these medications       acetaminophen 500 MG tablet  Commonly known as:  TYLENOL  Take 1,000 mg by mouth every 6 (six) hours as needed for pain.     allopurinol 100 MG tablet  Commonly known as:  ZYLOPRIM  Take 1 tablet by mouth every evening.     amiodarone 200 MG tablet  Commonly known as:  PACERONE  Take 1 tablet (200 mg total) by mouth daily.     atorvastatin 20 MG tablet  Commonly known as:  LIPITOR  Take 20 mg by mouth daily.     Cholecalciferol 10000 UNITS Caps  Take 1 capsule by mouth daily.     digoxin 0.125 MG tablet  Commonly known as:  LANOXIN  Take 0.5 tablets (0.0625 mg total) by mouth every evening.     donepezil 10 MG tablet  Commonly known as:  ARICEPT  Take 10 mg by mouth daily.     ELIQUIS 5 MG Tabs tablet  Generic drug:  apixaban  Take 5 mg by mouth 2 (two) times daily.     furosemide 40 MG tablet  Commonly known as:  LASIX  Take 1 tablet (40 mg total) by mouth every other day.  Start taking on:   01/23/2013     lisinopril 5 MG tablet  Commonly known as:  PRINIVIL,ZESTRIL  Take 2.5 mg by mouth daily.     milrinone 20 MG/100ML Soln infusion  Commonly known as:  PRIMACOR  Inject 18.875 mcg/min into the vein continuous.        Disposition   The patient will be discharged in stable  condition to home. Discharge Orders   Future Appointments Provider Department Dept Phone   01/26/2013 11:00 AM [facility] [phone]   Future Orders Complete By Expires   ACE Inhibitor / ARB already ordered  As directed    Diet - low sodium heart healthy  As directed    Discharge instructions  As directed    Comments:     Call any issues. [phone].   Heart Failure patients record your daily weight using the same scale at the same time of day  As directed    Increase activity slowly  As directed    STOP any activity that causes chest pain, shortness of breath, dizziness, sweating, or exessive weakness  As directed      Follow-up Information   Follow up with [name], MD On 01/26/2013. (@ 11:00 am; [facility] code [id number])    Specialty:  Cardiology   Contact information:   [address] [place] [address] [phone]       Follow up with [facility]. (home health nurse)    Contact information:   Home health IV nurse has been ordered, and has been set up.  If you have any questions, please call [phone]        Duration of Discharge Encounter: Greater than 35 minutes   Signed, Aundria Rud  01/21/2013, 4:18 PM

## 2013-01-22 DIAGNOSIS — I509 Heart failure, unspecified: Secondary | ICD-10-CM

## 2013-01-22 DIAGNOSIS — I4891 Unspecified atrial fibrillation: Secondary | ICD-10-CM

## 2013-01-22 DIAGNOSIS — I472 Ventricular tachycardia: Secondary | ICD-10-CM

## 2013-01-24 ENCOUNTER — Encounter (HOSPITAL_COMMUNITY): Payer: Self-pay | Admitting: Internal Medicine

## 2013-01-24 NOTE — Progress Notes (Signed)
LVAD assessment completed with patient this morning.  His wife was unable to attend the assessment but his "adopted" son Jonny Ruiz was present. Patient was able to verbalize good understanding of the VAD procedure as well as to exhibit positive coping skills. He will have strong family support prior to and after the procedure. From a psychosocial standpoint, SW recommends patient for VAD implantation.  Full assessment to follow.  Lorri Frederick. [place]  [phone]

## 2013-01-25 NOTE — Addendum Note (Signed)
Addendum created 01/25/13 0748 by Remonia Richter, MD   Modules edited: Anesthesia Events

## 2013-01-26 ENCOUNTER — Ambulatory Visit (HOSPITAL_COMMUNITY)
Admit: 2013-01-26 | Discharge: 2013-01-26 | Disposition: A | Discharge status: Home / Self Care | Payer: Non-veteran care | Attending: Cardiology | Admitting: Cardiology

## 2013-01-26 VITALS — BP 98/62 | HR 90 | Wt 168.0 lb

## 2013-01-26 DIAGNOSIS — R946 Abnormal results of thyroid function studies: Secondary | ICD-10-CM | POA: Insufficient documentation

## 2013-01-26 DIAGNOSIS — Z8673 Personal history of transient ischemic attack (TIA), and cerebral infarction without residual deficits: Secondary | ICD-10-CM | POA: Insufficient documentation

## 2013-01-26 DIAGNOSIS — I509 Heart failure, unspecified: Secondary | ICD-10-CM

## 2013-01-26 DIAGNOSIS — I340 Nonrheumatic mitral (valve) insufficiency: Secondary | ICD-10-CM

## 2013-01-26 DIAGNOSIS — R531 Weakness: Secondary | ICD-10-CM

## 2013-01-26 DIAGNOSIS — I428 Other cardiomyopathies: Secondary | ICD-10-CM | POA: Insufficient documentation

## 2013-01-26 DIAGNOSIS — I4819 Other persistent atrial fibrillation: Secondary | ICD-10-CM

## 2013-01-26 DIAGNOSIS — I079 Rheumatic tricuspid valve disease, unspecified: Secondary | ICD-10-CM | POA: Insufficient documentation

## 2013-01-26 DIAGNOSIS — M19049 Primary osteoarthritis, unspecified hand: Secondary | ICD-10-CM | POA: Insufficient documentation

## 2013-01-26 DIAGNOSIS — I129 Hypertensive chronic kidney disease with stage 1 through stage 4 chronic kidney disease, or unspecified chronic kidney disease: Secondary | ICD-10-CM | POA: Insufficient documentation

## 2013-01-26 DIAGNOSIS — I2510 Atherosclerotic heart disease of native coronary artery without angina pectoris: Secondary | ICD-10-CM | POA: Insufficient documentation

## 2013-01-26 DIAGNOSIS — Z95810 Presence of automatic (implantable) cardiac defibrillator: Secondary | ICD-10-CM | POA: Insufficient documentation

## 2013-01-26 DIAGNOSIS — F0390 Unspecified dementia without behavioral disturbance: Secondary | ICD-10-CM | POA: Insufficient documentation

## 2013-01-26 DIAGNOSIS — R5381 Other malaise: Secondary | ICD-10-CM

## 2013-01-26 DIAGNOSIS — N183 Chronic kidney disease, stage 3 unspecified: Secondary | ICD-10-CM | POA: Insufficient documentation

## 2013-01-26 DIAGNOSIS — I059 Rheumatic mitral valve disease, unspecified: Secondary | ICD-10-CM | POA: Insufficient documentation

## 2013-01-26 DIAGNOSIS — G44229 Chronic tension-type headache, not intractable: Secondary | ICD-10-CM | POA: Insufficient documentation

## 2013-01-26 DIAGNOSIS — G479 Sleep disorder, unspecified: Secondary | ICD-10-CM | POA: Insufficient documentation

## 2013-01-26 DIAGNOSIS — I5042 Chronic combined systolic (congestive) and diastolic (congestive) heart failure: Secondary | ICD-10-CM

## 2013-01-26 DIAGNOSIS — I252 Old myocardial infarction: Secondary | ICD-10-CM | POA: Insufficient documentation

## 2013-01-26 DIAGNOSIS — I4891 Unspecified atrial fibrillation: Secondary | ICD-10-CM

## 2013-01-26 DIAGNOSIS — Z7901 Long term (current) use of anticoagulants: Secondary | ICD-10-CM

## 2013-01-26 DIAGNOSIS — I5022 Chronic systolic (congestive) heart failure: Secondary | ICD-10-CM | POA: Insufficient documentation

## 2013-01-26 NOTE — Patient Instructions (Signed)
Doing great.   No changes to medications.  Will call [facility] to get labs.  Will call you on Friday to let you know about Sundays dose of Eliquis  Call any issues   Do the following things EVERYDAY: 1) Weigh yourself in the morning before breakfast. Write it down and keep it in a log. 2) Take your medicines as prescribed 3) Eat low salt foods-Limit salt (sodium) to 2000 mg per day.  4) Stay as active as you can everyday 5) Limit all fluids for the day to less than 2 liters

## 2013-01-26 NOTE — Progress Notes (Signed)
Patient ID: Nathan Mckinney, male   DOB: [birth year], 72 y.o.   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 Alanda Amass since 2001. He was recently admitted with AF, cardiogenic shock EF 20% with severe MR. Underwent DC-CV (01/2013).  Admitted 01/10/13 for A/C HF and started on milrinone and IV lasix and his BB and ACE-I were discontinued due to shock. He was diuresed total of 1.7 liters and was started back on low dose lisinopril 2.5 mg daily. Discussion during stay for VAD placement and started the process.  He was sent home on milrinone.   Follow up: He was discharged on 01/19/13 to home. Feeling overall pretty good. Denies SOB, orthopnea or CP. Can walk about 50-75 yds before SOB. Walking with walker. Eating pretty well and eating a lot of protein. [facility] RN and PT working with pt. Weight at home 166-168 lbs.   Labs (10/14): K 4, creatinine 1.43, LDL 46  ROS: All systems negative except as listed in HPI, PMH and Problem List.  SH: Married, nonsmoker, lives in [place]  FH: No premature CAD  Past Medical History  Diagnosis Date  . Hypertension   . Chronic tension headaches   . Tinea cruris   . Tinea unguium   . Hernia, hiatal   . CVA (cerebral infarction)   . Systolic heart failure     EF 40-98% in 08/2012 (Echo at [facility] [place])  . Bradycardia   . Ventricular tachycardia   . Syncope   . CAD (coronary artery disease)     h/o MI in 2001, admitted for possible STEMI at [facility] in 08/2012 - Cardiac cath at [facility] in 08/2012: Nonobstructive coronary artery disease  . Sleep disorder   . Atrial fibrillation     On Eliquis, new onset in 08/2012 - successful cardioversion to sinus rhythm  . Automatic implantable cardioverter-defibrillator in situ   . Pacemaker     ICD/PACEMAKER   . Arthritis    HANDS  . CHF (congestive heart failure)   . Anginal pain   . Depression   . Shortness of breath   . Pneumonia   . Heart murmur   . Stroke   . Mitral regurgitation   . Acute on chronic systolic heart failure-  10/22/2012  . Chronic anticoagulation- Eloquis   . CKD (chronic kidney disease) stage 3, GFR 30-59 ml/min   . 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 Atrial fibrillation, persistent, recurrent   . Chronic combined systolic and diastolic congestive heart failure   . Non-ischemic cardiomyopathy 1997  . Tricuspid regurgitation     Current Outpatient Prescriptions  Medication Sig Dispense Refill  . acetaminophen (TYLENOL) 500 MG tablet Take 1,000 mg by mouth every 6 (six) hours as needed for pain.      Marland Kitchen allopurinol (ZYLOPRIM) 100 MG tablet Take 1 tablet by mouth every evening.       Marland Kitchen amiodarone (PACERONE) 200 MG tablet Take 1 tablet (200 mg total) by mouth daily.  30 tablet  6  . apixaban (ELIQUIS) 5 MG TABS tablet Take 5 mg by mouth 2 (two) times daily.      Marland Kitchen atorvastatin (LIPITOR) 20 MG tablet Take 20 mg by mouth daily.      . Cholecalciferol 10000 UNITS CAPS Take 1 capsule by mouth daily.      Marland Kitchen  digoxin (LANOXIN) 0.125 MG tablet Take 0.5 tablets (0.0625 mg total) by mouth every evening.  15 tablet  3  . donepezil (ARICEPT) 10 MG tablet Take 10 mg by mouth daily.      . furosemide (LASIX) 40 MG tablet Take 1 tablet (40 mg total) by mouth every other day.  60 tablet  11  . lisinopril (PRINIVIL,ZESTRIL) 5 MG tablet Take 2.5 mg by mouth daily.      . milrinone (PRIMACOR) 20 MG/100ML SOLN infusion Inject 18.875 mcg/min into the vein continuous.  100 mL  6   No current facility-administered medications for this encounter.     PHYSICAL EXAM: Filed Vitals:   01/26/13 1102  BP: 98/62  Pulse: 90  Weight: 168 lb (76.204 kg)  SpO2: 98%    General:  Well appearing. No resp difficulty HEENT: normal Neck: supple. JVP flat. Carotids 2+ bilaterally; no bruits. No  lymphadenopathy or thryomegaly appreciated. Cor: PMI lateral. Regular rate & rhythm. No rubs, gallops.  2/6 HSM apex. Lungs: clear Abdomen: soft, nontender, nondistended. No hepatosplenomegaly. No bruits or masses. Good bowel sounds. Extremities: no cyanosis, clubbing, rash, edema Neuro: alert & orientedx3, cranial nerves grossly intact. Moves all 4 extremities w/o difficulty. Affect pleasant.  ASSESSMENT & PLAN: 1. Chronic systolic CHF: Nonischemic cardiomyopathy, end stage.  Medtronic CRT-D device.  He is now on home milrinone.  Stable NYHA class III symptoms.  He has been undergoing LVAD workup.  Current plan (if approved by insurance) is RHC next week followed by heparin bridge, then LVAD on 10/16.   - Continue current milrinone dosing.  - Continue current doses of digoxin, lisinopril, and Lasix.   - BMET/digoxin level today.  2. Abnormal TSH: Noted in the hospital.  Will repeat TSH, free T4, and free T3 (risk of thyroid abnormalities on amiodarone).  3. Paroxysmal atrial fibrillation: Patient is in NSR today on amiodarone. Continue apixaban for anticoagulation.  4. Mitral regurgitation: Severe on last echo.  Suspect functional.  Hopefully will improve with LV unloaded via LVAD.   Marca Ancona 01/26/2013

## 2013-01-27 ENCOUNTER — Telehealth (HOSPITAL_COMMUNITY): Payer: Self-pay | Admitting: Cardiology

## 2013-01-27 NOTE — Telephone Encounter (Signed)
Spoke w/pt's wife she states pt has not been weighing with the same amount of clothes on, she reports today he weighted with heavier clothes and bedroom shoes on, I advised that he needs to be consistent in the same amount of clothes at the same time, she is agreeable and will ensure this is what he starts doing, pt was seen 10/8 and was stable

## 2013-01-27 NOTE — Telephone Encounter (Signed)
Pt called with concerns of increased weight  10/7 164 10/8 168.8 10/9 169.3  denies SOB and abd swelling Pt would like to know if he should come in for a visit

## 2013-01-28 ENCOUNTER — Telehealth (HOSPITAL_COMMUNITY): Payer: Self-pay | Admitting: *Deleted

## 2013-01-28 NOTE — Telephone Encounter (Signed)
Called pt per Nathan Potash, NP. Nathan Mckinney had questions during 01/26/13 clinic visit concerning VA benefits with VAD implant.  Nathan Mckinney has been trying to reach pt/Nathan Mckinney for financial update and has been unable to reach her today. Nathan Mckinney wants to know their "portion" of VAD implant. VAD coordinator unable to answer question; will ask Nathan Mckinney or Nathan Mckinney to contact Nathan Mckinney Monday.  Nathan Mckinney has not completed her interview with our Child psychotherapist; this will need to be completed prior to VAD approval. Asked pt to continue meds until final approval for VAD implant. Nathan Mckinney verbalized understanding of above.

## 2013-01-31 ENCOUNTER — Telehealth: Payer: Self-pay | Admitting: Cardiovascular Disease

## 2013-01-31 ENCOUNTER — Telehealth (HOSPITAL_COMMUNITY): Payer: Self-pay | Admitting: *Deleted

## 2013-01-31 NOTE — Telephone Encounter (Signed)
Faxed to [facility], Attention Evelene Croon, patient's last EF on 01/31/13

## 2013-01-31 NOTE — Telephone Encounter (Signed)
Pt's wife had questions regarding insurance coverage and VA benefits with VAD surgery and subsequent f/u visits, labs, supplies. Forestine Na, Tree surgeon called and answered wife's questions regarding insurance and benefits.  Wife expressed desire to proceed with surgery.   Lovette Cliche, AHF Social Worker called and spoke with Ms. Ammar to complete social evaluation.   After above conversations with wife, concerns about 24/7 caregiver identified following VAD implant and discharge home. Wife designated Amador Cunas (close family friend) as the primary caregiver for pt; he will be "able to help" but cannot stay with patient 24/7 during initial discharge period. Our team has had multiple conversations with wife, Mr. Norberto Sorenson, daughter Charlean Merl), and patient regarding VAD implant and post care. Dr. Gala Romney updated, will postpone RHC and surgery for this week. Appointment for family conference scheduled for Thursday 02/03/13 at 4 pm; patient, wife, Jonny Ruiz, and Annice Pih aware and all voiced understanding of same.  Pt is to continue all meds including Eliquis; patient and wife verbalized understanding of same.

## 2013-01-31 NOTE — Telephone Encounter (Signed)
Need last ejection fraction-Please fax to [phone].

## 2013-01-31 NOTE — Addendum Note (Signed)
Addendum created 01/31/13 2006 by Remonia Richter, MD   Modules edited: Anesthesia Events

## 2013-02-02 ENCOUNTER — Inpatient Hospital Stay (HOSPITAL_COMMUNITY): Admission: RE | Admit: 2013-02-02 | Payer: Non-veteran care | Source: Ambulatory Visit

## 2013-02-03 ENCOUNTER — Ambulatory Visit (HOSPITAL_COMMUNITY)
Admission: RE | Admit: 2013-02-03 | Discharge: 2013-02-03 | Disposition: A | Discharge status: Home / Self Care | Payer: Medicare Other | Source: Ambulatory Visit | Attending: Internal Medicine | Admitting: Internal Medicine

## 2013-02-03 ENCOUNTER — Encounter (HOSPITAL_COMMUNITY): Admission: RE | Payer: Self-pay | Source: Ambulatory Visit

## 2013-02-03 ENCOUNTER — Inpatient Hospital Stay (HOSPITAL_COMMUNITY): Admission: RE | Admit: 2013-02-03 | Payer: Medicare Other | Source: Ambulatory Visit | Admitting: Cardiothoracic Surgery

## 2013-02-03 DIAGNOSIS — I5042 Chronic combined systolic (congestive) and diastolic (congestive) heart failure: Secondary | ICD-10-CM

## 2013-02-03 DIAGNOSIS — I2510 Atherosclerotic heart disease of native coronary artery without angina pectoris: Secondary | ICD-10-CM | POA: Insufficient documentation

## 2013-02-03 DIAGNOSIS — I509 Heart failure, unspecified: Secondary | ICD-10-CM | POA: Insufficient documentation

## 2013-02-03 DIAGNOSIS — I4891 Unspecified atrial fibrillation: Secondary | ICD-10-CM | POA: Insufficient documentation

## 2013-02-03 DIAGNOSIS — I5022 Chronic systolic (congestive) heart failure: Secondary | ICD-10-CM | POA: Insufficient documentation

## 2013-02-03 SURGERY — INSERTION OF IMPLANTABLE LEFT VENTRICULAR ASSIST DEVICE
Anesthesia: General | Site: Chest

## 2013-02-11 ENCOUNTER — Encounter: Payer: Self-pay | Admitting: Internal Medicine

## 2013-02-12 NOTE — Progress Notes (Signed)
Patient ID: Nathan Mckinney, male   DOB: [birth year], 72 y.o.   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 Alanda Amass since 2001. He was recently admitted with AF, cardiogenic shock EF 20% with severe MR. Underwent DC-CV (01/2013).  Admitted 01/10/13 for A/C HF and started on milrinone and IV lasix and his BB and ACE-I were discontinued due to shock. He was diuresed total of 1.7 liters and was started back on low dose lisinopril 2.5 mg daily. Discussion during stay for VAD placement and started the process.  He was sent home on milrinone.   Follow up: He is here today with multiple family members to discuss whether or not he wants to proceed with VAD or remain with milrinone. He feels quite good on milrinone and is more alert and able to get around better. Denies SOB, orthopnea or CP. Weight is stable. No problem with infusion. Family notes that he still has problem with his memory. His wife is elderly and states clearly that she cannot function as primary caregiver if her were to get a VAD. His adopted son is currently helping with a large portion of his care.   Labs (10/14): K 4, creatinine 1.43, LDL 46  ROS: All systems negative except as listed in HPI, PMH and Problem List.  SH: Married, nonsmoker, lives in [place]  FH: No premature CAD  Past Medical History  Diagnosis Date  . Hypertension   . Chronic tension headaches   . Tinea cruris   . Tinea unguium   . Hernia, hiatal   . CVA (cerebral infarction)   . Systolic heart failure     EF 57-84% in 08/2012 (Echo at [facility], [place])  . Bradycardia   . Ventricular tachycardia   . Syncope   . CAD (coronary artery disease)     h/o MI in 2001, admitted for possible STEMI at [facility] in 08/2012 - Cardiac cath at [facility] in 08/2012: Nonobstructive coronary artery disease   . Sleep disorder   . Atrial fibrillation     On Eliquis, new onset in 08/2012 - successful cardioversion to sinus rhythm  . Automatic implantable cardioverter-defibrillator in situ   . Pacemaker     ICD/PACEMAKER   . Arthritis     HANDS  . CHF (congestive heart failure)   . Anginal pain   . Depression   . Shortness of breath   . Pneumonia   . Heart murmur   . Stroke   . Mitral regurgitation   . Acute on chronic systolic heart failure-  10/22/2012  . Chronic anticoagulation- Eloquis   . CKD (chronic kidney disease) stage 3, GFR 30-59 ml/min   . 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 Atrial fibrillation, persistent, recurrent   . Chronic combined systolic and diastolic congestive heart failure   . Non-ischemic cardiomyopathy 1997  . Tricuspid regurgitation     Current Outpatient Prescriptions  Medication Sig Dispense Refill  . acetaminophen (TYLENOL) 500 MG tablet Take 1,000 mg by mouth every 6 (six) hours as needed for pain.      Marland Kitchen allopurinol (ZYLOPRIM) 100 MG tablet Take 1 tablet by mouth every evening.       Marland Kitchen amiodarone (PACERONE) 200 MG tablet Take 1 tablet (200 mg total) by mouth daily.  30 tablet  6  . apixaban (ELIQUIS) 5  MG TABS tablet Take 5 mg by mouth 2 (two) times daily.      Marland Kitchen atorvastatin (LIPITOR) 20 MG tablet Take 20 mg by mouth daily.      . Cholecalciferol 10000 UNITS CAPS Take 1 capsule by mouth daily.      . digoxin (LANOXIN) 0.125 MG tablet Take 0.5 tablets (0.0625 mg total) by mouth every evening.  15 tablet  3  . donepezil (ARICEPT) 10 MG tablet Take 10 mg by mouth daily.      . furosemide (LASIX) 40 MG tablet Take 1 tablet (40 mg total) by mouth every other day.  60 tablet  11  . lisinopril (PRINIVIL,ZESTRIL) 5 MG tablet Take 2.5 mg by mouth daily.      . milrinone (PRIMACOR) 20 MG/100ML SOLN infusion Inject 18.875 mcg/min into the vein continuous.  100 mL  6   No current facility-administered medications for this encounter.     PHYSICAL  EXAM: 100/60 85 General:  More alert appearing. No resp difficulty HEENT: normal Neck: supple. JVP flat. Carotids 2+ bilaterally; no bruits. No lymphadenopathy or thryomegaly appreciated. Cor: PMI laterally displaced. Regular rate & rhythm. No rubs, gallops.  2/6 HSM apex. Lungs: clear Abdomen: soft, nontender, nondistended. No hepatosplenomegaly. No bruits or masses. Good bowel sounds. Extremities: no cyanosis, clubbing, rash, edema. PICC line site looks good Neuro: alert & orientedx3, cranial nerves grossly intact. Moves all 4 extremities w/o difficulty. Affect pleasant.  ASSESSMENT & PLAN: 1. Chronic systolic CHF: Nonischemic cardiomyopathy, end stage.  Medtronic CRT-D device.  He is now on home milrinone.  2. Abnormal TSH: Noted in the hospital.  Will repeat TSH, free T4, and free T3 (risk of thyroid abnormalities on amiodarone).  3. Paroxysmal atrial fibrillation: Patient is in NSR today on amiodarone. Continue apixaban for anticoagulation.  4. Mitral regurgitation: Severe on last echo.  Suspect functional.   Prior to my arriving in clinic Hessie Diener, RN had an extensive discussion with Mr. Mcnamee and his family about VAD therapy and care. She educated them with the use of a mock loop and answered many of their questions. I then spent over an hour with Mr. Shillingford and his family discussing his heart failure and therapy options including VAD, home inotropes and Hospice We also expalined our VAD selection process. Mr. Paxton and his family asked many good questions and these were answered. During the discussions, Mrs. Towson made it clear that given her age and own medical problems she could not be the primary caregiver. Several family members also expressed concerns over Mr. Morad's memory. At the end of the discussion, we suggested that Mr. Holz and his family go home and discuss the options among themselves and get back to [place] with their decision about which option they favored.   Reuel Boom  BensimhonMD 02/12/2013

## 2013-02-24 ENCOUNTER — Encounter: Payer: Self-pay | Admitting: Internal Medicine

## 2013-03-07 ENCOUNTER — Telehealth (HOSPITAL_COMMUNITY): Payer: Self-pay | Admitting: Adult Health

## 2013-03-07 NOTE — Telephone Encounter (Signed)
Provided with lab results.   Creatinine trending up. 1.3 > 1.8. He says his weight has been 16-162 pounds.   Instructed to hold lasix 11/17 and 03/09/13. [facility] to repeat BMET 03/11/13.   Nathan Mckinney verbalized understanding .  Nathan Mckinney,AMYNP-C 10:40 AM

## 2013-03-09 ENCOUNTER — Ambulatory Visit (INDEPENDENT_AMBULATORY_CARE_PROVIDER_SITE_OTHER): Payer: Medicare Other | Admitting: Internal Medicine

## 2013-03-09 ENCOUNTER — Encounter: Payer: Self-pay | Admitting: Internal Medicine

## 2013-03-09 VITALS — BP 103/62 | HR 84 | Ht 71.0 in | Wt 171.8 lb

## 2013-03-09 DIAGNOSIS — I5042 Chronic combined systolic (congestive) and diastolic (congestive) heart failure: Secondary | ICD-10-CM

## 2013-03-09 DIAGNOSIS — I472 Ventricular tachycardia, unspecified: Secondary | ICD-10-CM

## 2013-03-09 DIAGNOSIS — I428 Other cardiomyopathies: Secondary | ICD-10-CM

## 2013-03-09 DIAGNOSIS — I4891 Unspecified atrial fibrillation: Secondary | ICD-10-CM

## 2013-03-09 DIAGNOSIS — Z95810 Presence of automatic (implantable) cardiac defibrillator: Secondary | ICD-10-CM

## 2013-03-09 DIAGNOSIS — I509 Heart failure, unspecified: Secondary | ICD-10-CM

## 2013-03-09 DIAGNOSIS — I5023 Acute on chronic systolic (congestive) heart failure: Secondary | ICD-10-CM

## 2013-03-09 DIAGNOSIS — I4819 Other persistent atrial fibrillation: Secondary | ICD-10-CM

## 2013-03-09 LAB — MDC_IDC_ENUM_SESS_TYPE_INCLINIC
Brady Statistic AP VP Percent: 0 %
Brady Statistic AP VS Percent: 0 %
Brady Statistic AS VP Percent: 27.6 %
Brady Statistic AS VS Percent: 72.4 %
Brady Statistic RV Percent Paced: 30.42 %
Date Time Interrogation Session: 20141119164301
HighPow Impedance: 59 Ohm
HighPow Impedance: 61 Ohm
Lead Channel Impedance Value: 247 Ohm
Lead Channel Impedance Value: 361 Ohm
Lead Channel Impedance Value: 456 Ohm
Lead Channel Impedance Value: 475 Ohm
Lead Channel Pacing Threshold Amplitude: 0.875 V
Lead Channel Pacing Threshold Pulse Width: 0.4 ms
Lead Channel Pacing Threshold Pulse Width: 0.4 ms
Lead Channel Sensing Intrinsic Amplitude: 13.875 mV
Lead Channel Setting Pacing Amplitude: 2 V
Lead Channel Setting Pacing Amplitude: 3.25 V
Lead Channel Setting Pacing Pulse Width: 0.4 ms
Lead Channel Setting Sensing Sensitivity: 0.3 mV
Zone Setting Detection Interval: 260 ms
Zone Setting Detection Interval: 420 ms
Zone Setting Detection Interval: 430 ms

## 2013-03-09 NOTE — Assessment & Plan Note (Signed)
He is currently maintaining sinus rhythm on amiodarone therapy. No change in medications today.

## 2013-03-09 NOTE — Progress Notes (Signed)
HPI [name] returns today for ICD followup. He is a very pleasant 72 year old man with an ischemic cardiomyopathy, chronic systolic heart failure, gout, and regular tachycardia, status post ICD implantation. The patient was hospitalized several weeks ago with worsening heart failure and was ultimately placed on intravenous milrinone. He feels much improved. He denies chest pain in his heart failure symptoms are now back to class IIb. Off of milrinone, he was class IV. He has had no recent ICD shock. He was in atrial fibrillation, but with the initiation of amiodarone, he is return to sinus rhythm very nicely. No recent ICD shocks or therapies. Allergies  Allergen Reactions  . Penicillins     REACTION: Rash     Current Outpatient Prescriptions  Medication Sig Dispense Refill  . acetaminophen (TYLENOL) 500 MG tablet Take 1,000 mg by mouth every 6 (six) hours as needed for pain.      Marland Kitchen allopurinol (ZYLOPRIM) 100 MG tablet Take 1 tablet by mouth every evening.       Marland Kitchen amiodarone (PACERONE) 200 MG tablet Take 1 tablet (200 mg total) by mouth daily.  30 tablet  6  . apixaban (ELIQUIS) 5 MG TABS tablet Take 5 mg by mouth 2 (two) times daily.      Marland Kitchen atorvastatin (LIPITOR) 20 MG tablet Take 20 mg by mouth daily.      . Cholecalciferol 10000 UNITS CAPS Take 1 capsule by mouth daily.      . digoxin (LANOXIN) 0.125 MG tablet Take 0.5 tablets (0.0625 mg total) by mouth every evening.  15 tablet  3  . donepezil (ARICEPT) 10 MG tablet Take 10 mg by mouth daily.      . furosemide (LASIX) 40 MG tablet Take 1 tablet (40 mg total) by mouth every other day.  60 tablet  11  . lisinopril (PRINIVIL,ZESTRIL) 5 MG tablet Take 2.5 mg by mouth daily.      . memantine (NAMENDA) 10 MG tablet 10 mg.      . milrinone (PRIMACOR) 20 MG/100ML SOLN infusion Inject 18.875 mcg/min into the vein continuous.  100 mL  6   No current facility-administered medications for this visit.     Past Medical History    Diagnosis Date  . Hypertension   . Chronic tension headaches   . Tinea cruris   . Tinea unguium   . Hernia, hiatal   . CVA (cerebral infarction)   . Systolic heart failure     EF 40-98% in 08/2012 (Echo at [facility], [place])  . Bradycardia   . Ventricular tachycardia   . Syncope   . CAD (coronary artery disease)     h/o MI in 2001, admitted for possible STEMI at [facility] in 08/2012 - Cardiac cath at [facility] in 08/2012: Nonobstructive coronary artery disease  . Sleep disorder   . Atrial fibrillation     On Eliquis, new onset in 08/2012 - successful cardioversion to sinus rhythm  . Automatic implantable cardioverter-defibrillator in situ   . Pacemaker     ICD/PACEMAKER   . Arthritis     HANDS  . CHF (congestive heart failure)   . Anginal pain   . Depression   . Shortness of breath   . Pneumonia   . Heart murmur   . Stroke   . Mitral regurgitation   . Acute on chronic systolic heart failure-  10/22/2012  . Chronic anticoagulation- Eloquis   . CKD (chronic kidney disease) stage 3, GFR 30-59  ml/min   . 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 Atrial fibrillation, persistent, recurrent   . Chronic combined systolic and diastolic congestive heart failure   . Non-ischemic cardiomyopathy 1997  . Tricuspid regurgitation     ROS:   All systems reviewed and negative except as noted in the HPI.   Past Surgical History  Procedure Laterality Date  . Vasectomy    . Icd implantation  06/12/08    Medtronic biventricular ICD, remote - no, Dr. Ladona Ridgel  . Cardiac catheterization  02/03/03    Dr. Jenne Campus   . Insert / replace / remove pacemaker      pacer/ICD  . Colonoscopy N/A 01/19/2013    Procedure: COLONOSCOPY;  Surgeon: Beverley Fiedler, MD;  Location: [facility] ENDOSCOPY;  Service: Endoscopy;  Laterality: N/A;  . Esophagogastroduodenoscopy N/A 01/19/2013    Procedure: ESOPHAGOGASTRODUODENOSCOPY (EGD);  Surgeon: Beverley Fiedler, MD;  Location: [facility] ENDOSCOPY;  Service: Endoscopy;   Laterality: N/A;     Family History  Problem Relation Age of Onset  . Cancer Mother     unclear what type  . Cancer Father     stomach     History   Social History  . Marital Status: Married    Spouse Name: N/A    Number of Children: N/A  . Years of Education: N/A   Occupational History  . Retired    Social History Main Topics  . Smoking status: Passive Smoke Exposure - Never Smoker  . Smokeless tobacco: Never Used     Comment: denies tobacco use   . Alcohol Use: No  . Drug Use: No  . Sexual Activity: Not on file   Other Topics Concern  . Not on file   Social History Narrative   Married and lives in [place].     BP 103/62  Pulse 84  Ht 5\' 11"  (1.803 m)  Wt 171 lb 12.8 oz (77.928 kg)  BMI 23.97 kg/m2  Physical Exam:  stable appearing 72 yo man, NAD, with intravenous milrinone ongoing HEENT: Unremarkable Neck:  No JVD, no thyromegally Back:  No CVA tenderness Lungs:  Clear with no wheezes HEART:  Regular rate rhythm, no murmurs, no rubs, no clicks Abd:  soft, positive bowel sounds, no organomegally, no rebound, no guarding Ext:  2 plus pulses, no edema, no cyanosis, no clubbing Skin:  No rashes no nodules Neuro:  CN II through XII intact, motor grossly intact   DEVICE  Normal device function.  See PaceArt for details.   Assess/Plan:

## 2013-03-09 NOTE — Assessment & Plan Note (Signed)
He is maintaining sinus rhythm very nicely on low-dose amiodarone. He will continue this medication.

## 2013-03-09 NOTE — Patient Instructions (Addendum)
  Your physician recommends that you schedule a follow-up appointment in: 6 weeks with Dr Gala Romney   Your physician wants you to follow-up in: 12 months with Dr Court Joy will receive a reminder letter in the mail two months in advance. If you don't receive a letter, please call our office to schedule the follow-up appointment.       Remote monitoring is used to monitor your Pacemaker of ICD from home. This monitoring reduces the number of office visits required to check your device to one time per year. It allows [place] to keep an eye on the functioning of your device to ensure it is working properly. You are scheduled for a device check from home on 06/10/13. You may send your transmission at any time that day. If you have a wireless device, the transmission will be sent automatically. After your physician reviews your transmission, you will receive a postcard with your next transmission date.

## 2013-03-09 NOTE — Assessment & Plan Note (Signed)
The patient's atrial pacing threshold is elevated but he does sense at a reasonable level. Otherwise his device is working normally. There is been no intercurrent ventricular arrhythmias and he is maintaining sinus rhythm.

## 2013-03-09 NOTE — Assessment & Plan Note (Signed)
In sinus rhythm and on intravenous milrinone, his heart failure symptoms are currently class IIb. He is instructed to continue his current medical therapy, maintain a low-sodium diet. He is considered a left ventricular assist device. He is not certain whether he wants to proceed with this form of therapy.

## 2013-03-16 ENCOUNTER — Encounter: Payer: Self-pay | Admitting: Internal Medicine

## 2013-03-22 ENCOUNTER — Telehealth (HOSPITAL_COMMUNITY): Payer: Self-pay | Admitting: Cardiology

## 2013-03-22 MED ORDER — MAGNESIUM OXIDE 400 MG PO TABS: 400.0000 mg | ORAL_TABLET | Freq: Every day | ORAL | Status: AC

## 2013-03-22 NOTE — Telephone Encounter (Signed)
pts wife and [facility] home telemonitoring RN called with concerns pts weight is slightly trending upward and pt is c/o dizziness x 3-4 days and SOB Denies swelling ,cough or increased fatigue [facility] RN will fax monitoring results ASAP

## 2013-03-22 NOTE — Telephone Encounter (Signed)
Spoke w/pt's wife wt has trended up to 170, he is usually around 165 she states he is feeling ok, only a little dizziness in AM when he first gets up then he feels ok, Tonye Becket, NP had advised to hold Lasix for a couple of days on 11/17, however they misunderstood and pt has not been taking Lasix at all, advised to restart Lasix and take one tab tomorrow and one on Thursday, skip Friday then Sat start taking every other day as he was on before, pt verbalizes understanding, lab work from 11/28 show cr back to baseline (1.43) and mag 1.7 per Ulla Potash start mag-ox 400 mg daily, wife aware and rx sent into pharmacy, labs are checked by [facility] every Fri, if wt not coming down wife will call us back

## 2013-03-25 ENCOUNTER — Other Ambulatory Visit (HOSPITAL_COMMUNITY): Payer: Self-pay | Admitting: *Deleted

## 2013-03-25 MED ORDER — AMIODARONE HCL 200 MG PO TABS: 200.0000 mg | ORAL_TABLET | Freq: Every day | ORAL | Status: AC

## 2013-03-28 ENCOUNTER — Encounter: Payer: Self-pay | Admitting: Internal Medicine

## 2013-04-05 ENCOUNTER — Telehealth (HOSPITAL_COMMUNITY): Payer: Self-pay | Admitting: Cardiology

## 2013-04-05 NOTE — Telephone Encounter (Signed)
Opened in error

## 2013-04-12 ENCOUNTER — Ambulatory Visit (HOSPITAL_COMMUNITY)
Admission: RE | Admit: 2013-04-12 | Discharge: 2013-04-12 | Disposition: A | Discharge status: Home / Self Care | Payer: Medicare Other | Source: Ambulatory Visit | Attending: Internal Medicine | Admitting: Internal Medicine

## 2013-04-12 VITALS — BP 110/68 | HR 78 | Wt 168.0 lb

## 2013-04-12 DIAGNOSIS — I059 Rheumatic mitral valve disease, unspecified: Secondary | ICD-10-CM

## 2013-04-12 DIAGNOSIS — I340 Nonrheumatic mitral (valve) insufficiency: Secondary | ICD-10-CM

## 2013-04-12 DIAGNOSIS — I509 Heart failure, unspecified: Secondary | ICD-10-CM | POA: Insufficient documentation

## 2013-04-12 DIAGNOSIS — I5042 Chronic combined systolic (congestive) and diastolic (congestive) heart failure: Secondary | ICD-10-CM

## 2013-04-12 DIAGNOSIS — I5022 Chronic systolic (congestive) heart failure: Secondary | ICD-10-CM | POA: Insufficient documentation

## 2013-04-12 NOTE — Addendum Note (Signed)
Encounter addended by: Noralee Space, RN on: 04/12/2013 11:30 AM<BR>     Documentation filed: Patient Instructions Section, Medications

## 2013-04-12 NOTE — Progress Notes (Signed)
Patient ID: Nathan Mckinney, male   DOB: [birth year], 72 y.o.   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 Alanda Amass since 2001. He was recently admitted with AF, cardiogenic shock EF 20% with severe MR. Underwent DC-CV (01/2013).  Admitted 01/10/13 for A/C HF and started on milrinone and IV lasix and his BB and ACE-I were discontinued due to shock. He was diuresed total of 1.7 liters and was started back on low dose lisinopril 2.5 mg daily. Discussion during stay for VAD placement and started the process.  He was sent home on milrinone. We then had family meeting and decided not to pursue VAD due to declining cognitive function and lack of primary caregiver who could handle VAD.  He returns for follow up. Feels quite good. Able to do ADLs without a problem. Walks the dog 3x per day. Just occasional SOB. No edema, orthopnea, or PND. Weight very stable - followed by [employer]. Has not been taking lisinopril as he though only should take if BP is low. [facility] stopped digoxin. No problems with milrinone line   Labs (01/28/13) : TSH 5.0 T4 8.9 T3 14.3  Labs (10/14): K 4, creatinine 1.43, LDL 46 Labs (04/08/13) K 4.1 Creatinine 1.53   ROS: All systems negative except as listed in HPI, PMH and Problem List.  SH: Married, nonsmoker, lives in [place]  FH: No premature CAD  Past Medical History  Diagnosis Date  . Hypertension   . Chronic tension headaches   . Tinea cruris   . Tinea unguium   . Hernia, hiatal   . CVA (cerebral infarction)   . Systolic heart failure     EF 40-98% in 08/2012 (Echo at [facility], [place])  . Bradycardia   . Ventricular tachycardia   . Syncope   . CAD (coronary artery disease)     h/o MI in 2001, admitted for possible STEMI at [facility] in 08/2012 - Cardiac cath at [facility] in 08/2012:  Nonobstructive coronary artery disease  . Sleep disorder   . Atrial fibrillation     On Eliquis, new onset in 08/2012 - successful cardioversion to sinus rhythm  . Automatic implantable cardioverter-defibrillator in situ   . Pacemaker     ICD/PACEMAKER   . Arthritis     HANDS  . CHF (congestive heart failure)   . Anginal pain   . Depression   . Shortness of breath   . Pneumonia   . Heart murmur   . Stroke   . Mitral regurgitation   . Acute on chronic systolic heart failure-  10/22/2012  . Chronic anticoagulation- Eloquis   . CKD (chronic kidney disease) stage 3, GFR 30-59 ml/min   . 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 Atrial fibrillation, persistent, recurrent   . Chronic combined systolic and diastolic congestive heart failure   . Non-ischemic cardiomyopathy 1997  . Tricuspid regurgitation     Current Outpatient Prescriptions  Medication Sig Dispense Refill  . acetaminophen (TYLENOL) 500 MG tablet Take 1,000 mg by mouth every 6 (six) hours as needed for pain.      Marland Kitchen allopurinol (ZYLOPRIM) 100 MG tablet Take 1 tablet by mouth every evening.       Marland Kitchen amiodarone (PACERONE) 200 MG tablet Take 1 tablet (200 mg total) by mouth daily.  30 tablet  6  .  apixaban (ELIQUIS) 5 MG TABS tablet Take 5 mg by mouth 2 (two) times daily.      Marland Kitchen atorvastatin (LIPITOR) 20 MG tablet Take 20 mg by mouth daily.      . Cholecalciferol 10000 UNITS CAPS Take 1 capsule by mouth daily.      . digoxin (LANOXIN) 0.125 MG tablet Take 0.5 tablets (0.0625 mg total) by mouth every evening.  15 tablet  3  . donepezil (ARICEPT) 10 MG tablet Take 10 mg by mouth daily.      . furosemide (LASIX) 40 MG tablet Take 1 tablet (40 mg total) by mouth every other day.  60 tablet  11  . lisinopril (PRINIVIL,ZESTRIL) 5 MG tablet Take 2.5 mg by mouth daily. Hold if SBP < 100      . magnesium oxide (MAG-OX) 400 MG tablet Take 1 tablet (400 mg total) by mouth daily.  30 tablet  6  . milrinone (PRIMACOR) 20 MG/100ML  SOLN infusion Inject 18.875 mcg/min into the vein continuous.  100 mL  6   No current facility-administered medications for this encounter.     PHYSICAL EXAM: Filed Vitals:   04/12/13 1059  BP: 110/68  Pulse: 78  Weight: 168 lb (76.204 kg)  SpO2: 99%    General:  Looks good. No resp difficulty HEENT: normal Neck: supple. JVP flat. Carotids 2+ bilaterally; no bruits. No lymphadenopathy or thryomegaly appreciated. Cor: PMI laterally displaced. Regular rate & rhythm. No rubs. Soft s3  2/6 HSM apex. Lungs: clear Abdomen: soft, nontender, nondistended. No hepatosplenomegaly. No bruits or masses. Good bowel sounds. Extremities: no cyanosis, clubbing, rash, edema. PICC line site looks good Neuro: alert & orientedx3, cranial nerves grossly intact. Moves all 4 extremities w/o difficulty. Affect pleasant.  ASSESSMENT & PLAN: 1. Chronic systolic CHF: Nonischemic cardiomyopathy, end stage.  Medtronic CRT-D device.  He is now on home milrinone.  2. Paroxysmal atrial fibrillation: Patient is in NSR today on amiodarone. Continue apixaban for anticoagulation.  3. Mitral regurgitation: Severe on last echo.  Suspect functional.    He is doing remarkably on home milrinone. Will continue. Resume lisinopril. Reinforced need for daily weights and reviewed use of sliding scale diuretics. RTC in 3 months.   Daniel Bensimhon,MD 11:24 AM

## 2013-04-12 NOTE — Patient Instructions (Addendum)
Stop Digoxin  Restart Lisionpril--HOLD if BP less than 100 (top number)  We will contact you in 3 months to schedule your next appointment.

## 2013-04-22 ENCOUNTER — Telehealth (HOSPITAL_COMMUNITY): Payer: Self-pay | Admitting: Surgery

## 2013-04-22 NOTE — Telephone Encounter (Signed)
After speaking with Nicolette Bang I called Mr. Kuehne back and told him she suggested that he take an extra Lasix pill today.  He verbalized understanding.  I also spoke to his wife and told her what Deatra Canter recommended.  She verbalized understanding as well.

## 2013-04-22 NOTE — Telephone Encounter (Signed)
Received call from Nathan Mckinney regarding patients weight being up.  Called [name] and discussed how he was feeling.  He denies any increased SOB or swelling.  He does say he ate [employer] chili yesterday evening.  [name] to continue his current medication regimen and encouraged to call clinic if he begins feeling worse.

## 2013-04-25 ENCOUNTER — Encounter: Payer: Self-pay | Admitting: Internal Medicine

## 2013-04-29 ENCOUNTER — Telehealth (HOSPITAL_COMMUNITY): Payer: Self-pay | Admitting: Cardiology

## 2013-04-29 NOTE — Telephone Encounter (Signed)
Opened in error

## 2013-04-29 NOTE — Telephone Encounter (Signed)
Attempted to call pt and Left message to call back   

## 2013-04-29 NOTE — Telephone Encounter (Signed)
[facility]/Samantha? nurse called with concerns. She feels as if pt is rapidly declining. She is not his normal nurse and  Since her last visit 03/27/13 she feels her has declined. She states pt is more SOB, weight has decreased, pt does have dry cough. Vitals good O2 sat 97%. Pt is on milrinone. Next appt due March 2015

## 2013-05-04 NOTE — Telephone Encounter (Signed)
Spoke w/pt's wife, wt is stable however she feels like he more fatigued and gives out of breathe easier, she states when he was here last he was doing great but has went down hill since that time, appt sch for Tue 1/20

## 2013-05-08 ENCOUNTER — Telehealth: Payer: Self-pay | Admitting: Adult Health

## 2013-05-08 NOTE — Telephone Encounter (Signed)
Wife called because patient (her husband) has transient complaints of chest pain. Pain moved into stomache. Took Tylenol for relief. Now pain moved into legs.   When speaking with his wife, the patient states now that his legs are better. He has some complaints of intermittent diarrhea now with gas.   I have advised that he take multi-puropse Immodium AD which has anti-gas properites. If pain persists ore diarrhea worsens they are to call back.

## 2013-05-09 ENCOUNTER — Encounter: Payer: Self-pay | Admitting: Internal Medicine

## 2013-05-10 ENCOUNTER — Ambulatory Visit (HOSPITAL_COMMUNITY)
Admission: RE | Admit: 2013-05-10 | Discharge: 2013-05-10 | Disposition: A | Discharge status: Home / Self Care | Payer: Medicare Other | Source: Ambulatory Visit | Attending: Internal Medicine | Admitting: Internal Medicine

## 2013-05-10 ENCOUNTER — Encounter: Payer: Self-pay | Admitting: Internal Medicine

## 2013-05-10 VITALS — BP 116/68 | HR 93 | Wt 170.5 lb

## 2013-05-10 DIAGNOSIS — I5042 Chronic combined systolic (congestive) and diastolic (congestive) heart failure: Secondary | ICD-10-CM

## 2013-05-10 DIAGNOSIS — I509 Heart failure, unspecified: Secondary | ICD-10-CM | POA: Insufficient documentation

## 2013-05-10 DIAGNOSIS — I5022 Chronic systolic (congestive) heart failure: Secondary | ICD-10-CM | POA: Insufficient documentation

## 2013-05-10 MED ORDER — FUROSEMIDE 40 MG PO TABS: 40.0000 mg | ORAL_TABLET | Freq: Every day | ORAL | Status: DC

## 2013-05-10 NOTE — Patient Instructions (Signed)
Follow up in next week  Take 80 mg of lasix today then tomorrow start lasix 40 mg daily   Do the following things EVERYDAY: 1) Weigh yourself in the morning before breakfast. Write it down and keep it in a log. 2) Take your medicines as prescribed 3) Eat low salt foods-Limit salt (sodium) to 2000 mg per day.  4) Stay as active as you can everyday 5) Limit all fluids for the day to less than 2 liters

## 2013-05-10 NOTE — Progress Notes (Signed)
Patient ID: Nathan Mckinney, male   DOB: 20-May-1940, 73 y.o.   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 Rollene Fare since 2001. He was recently admitted with AF, cardiogenic shock EF 20% with severe MR. Underwent DC-CV (01/2013).  Admitted 01/10/13 for A/C HF and started on milrinone and IV lasix and his BB and ACE-I were discontinued due to shock. He was diuresed total of 1.7 liters and was started back on low dose lisinopril 2.5 mg daily. Discussion during stay for VAD placement and started the process.  He was sent home on milrinone. We then had family meeting and decided not to pursue VAD due to declining cognitive function and lack of primary caregiver who could handle VAD.  He returns for an acute follow up with his wife due to dyspnea and chest pain.  Says he has progressive dyspnea. Increased dry non productive cough, fatigue/CP. Denies fevers Had diarrhea for the last 2 days but says he has slowed down today. Increased dyspnea performing ADLs.Sleeps on 1 pillow.  Poor appetite. Weight at home 172-173 pounds. Drinking > 2 liters. He continues on Milrinone via PICC. Followed by [facility].   Labs (01/28/13) : TSH 5.0 T4 8.9 T3 14.3  Labs (10/14): K 4, creatinine 1.43, LDL 46 Labs (04/08/13) K 4.1 Creatinine 1.53  Labs (1/04/26/13) K 4.6 Creatinine 1.73   ROS: All systems negative except as listed in HPI, PMH and Problem List.  SH: Married, nonsmoker, lives in [place]  FH: No premature CAD  Past Medical History  Diagnosis Date  . Hypertension   . Chronic tension headaches   . Tinea cruris   . Tinea unguium   . Hernia, hiatal   . CVA (cerebral infarction)   . Systolic heart failure     EF 15-20% in 08/2012 (Echo at [facility], [place])  . Bradycardia   . Ventricular tachycardia   . Syncope   . CAD (coronary artery disease)      h/o MI in 2001, admitted for possible STEMI at [facility] in 08/2012 - Cardiac cath at [facility] in 08/2012: Nonobstructive coronary artery disease  . Sleep disorder   . Atrial fibrillation     On Eliquis, new onset in 08/2012 - successful cardioversion to sinus rhythm  . Automatic implantable cardioverter-defibrillator in situ   . Pacemaker     ICD/PACEMAKER   . Arthritis     HANDS  . CHF (congestive heart failure)   . Anginal pain   . Depression   . Shortness of breath   . Pneumonia   . Heart murmur   . Stroke   . Mitral regurgitation   . Acute on chronic systolic heart failure-  1/0/1751  . Chronic anticoagulation- Eloquis   . CKD (chronic kidney disease) stage 3, GFR 30-59 ml/min   . 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 Atrial fibrillation, persistent, recurrent   . Chronic combined systolic and diastolic congestive heart failure   . Non-ischemic cardiomyopathy 1997  . Tricuspid regurgitation     Current Outpatient Prescriptions  Medication Sig Dispense Refill  . acetaminophen (TYLENOL) 500 MG tablet Take 1,000 mg by mouth every 6 (six) hours as needed for pain.      Marland Kitchen amiodarone (PACERONE) 200 MG tablet Take 1 tablet (200 mg total) by mouth daily.  30 tablet  6  .  apixaban (ELIQUIS) 5 MG TABS tablet Take 5 mg by mouth 2 (two) times daily.      Marland Kitchen atorvastatin (LIPITOR) 20 MG tablet Take 20 mg by mouth daily.      . Cholecalciferol 10000 UNITS CAPS Take 1 capsule by mouth daily.      Marland Kitchen donepezil (ARICEPT) 10 MG tablet Take 10 mg by mouth daily.      . furosemide (LASIX) 40 MG tablet Take 1 tablet (40 mg total) by mouth every other day.  60 tablet  11  . lisinopril (PRINIVIL,ZESTRIL) 5 MG tablet Take 2.5 mg by mouth daily. Hold if SBP < 100      . magnesium oxide (MAG-OX) 400 MG tablet Take 1 tablet (400 mg total) by mouth daily.  30 tablet  6  . milrinone (PRIMACOR) 20 MG/100ML SOLN infusion Inject 18.875 mcg/min into the vein continuous.  100 mL  6   No  current facility-administered medications for this encounter.     PHYSICAL EXAM: Filed Vitals:   05/10/13 1027  BP: 116/68  Pulse: 93  Weight: 170 lb 8 oz (77.338 kg)  SpO2: 97%    General:  Fatigued appearing. No resp difficulty Wife present  HEENT: normal Neck: supple. JVP ~8-9. Carotids 2+ bilaterally; no bruits. No lymphadenopathy or thryomegaly appreciated. Cor: PMI laterally displaced. Regular rate & rhythm. No rubs. Soft s3  2/6 TR 3/6 MR at apex. Lungs: clear Abdomen: soft, nontender, nondistended. No hepatosplenomegaly. No bruits or masses. Good bowel sounds. Extremities: no cyanosis, clubbing, rash, edema. RUE PICC line site looks good. No erythema  Neuro: alert & orientedx3, cranial nerves grossly intact. Moves all 4 extremities w/o difficulty. Affect pleasant.  ASSESSMENT & PLAN: 1. Chronic systolic CHF: Nonischemic cardiomyopathy, end stage.  Medtronic CRT-D device.  On home Milrinone 0.25 mcg . May need to increase to 0.375 mcg if he remains fatigued.  Optivol - Activity  < 2 hours per day.  Fluid trending up above threshold Take lasix 80 mg today and then start lasix 40 mg daily. Would like his dry weight down at home 167-168 pounds.    Reinforced limiting fluid intake to < 2 liters per day.  2. Paroxysmal atrial fibrillation: No VT Afib on Optivol. Continue amiodarone and apixaban.   3. Mitral regurgitation: Severe on last echo.  Suspect functional.   Follow up in 2 weeks   CLEGG,AMY NP-C 10:34 AM  Patient seen and examined with Darrick Grinder, NP. We discussed all aspects of the encounter. I agree with the assessment and plan as stated above.  Very difficult situation. He has end-stage HF and is deteriorating despite milrinone support. Now NYHA IV. He is not VAD candidate. Volume status up on exam and by Optivol. No VT or AF on ICD interrogation. Long talk about situation and options. For now will increase diuretics as above. Also increase milrinone to 0.375.We will  see back next week. If no improvement will likely need Hospice placement.   Total time 50 minutes. More than 3/4 of that time spent discussing above.   Benay Spice 7:38 PM

## 2013-05-16 ENCOUNTER — Ambulatory Visit (HOSPITAL_COMMUNITY)
Admission: RE | Admit: 2013-05-16 | Discharge: 2013-05-16 | Disposition: A | Discharge status: Home / Self Care | Payer: Medicare Other | Source: Ambulatory Visit | Attending: Internal Medicine | Admitting: Internal Medicine

## 2013-05-16 ENCOUNTER — Encounter: Payer: Self-pay | Admitting: Internal Medicine

## 2013-05-16 ENCOUNTER — Encounter (HOSPITAL_COMMUNITY): Payer: Self-pay

## 2013-05-16 VITALS — BP 128/70 | HR 88 | Resp 18 | Wt 173.2 lb

## 2013-05-16 DIAGNOSIS — I5042 Chronic combined systolic (congestive) and diastolic (congestive) heart failure: Secondary | ICD-10-CM

## 2013-05-16 DIAGNOSIS — R531 Weakness: Secondary | ICD-10-CM

## 2013-05-16 DIAGNOSIS — I509 Heart failure, unspecified: Secondary | ICD-10-CM | POA: Insufficient documentation

## 2013-05-16 DIAGNOSIS — I4891 Unspecified atrial fibrillation: Secondary | ICD-10-CM | POA: Insufficient documentation

## 2013-05-16 DIAGNOSIS — N183 Chronic kidney disease, stage 3 unspecified: Secondary | ICD-10-CM

## 2013-05-16 DIAGNOSIS — I5022 Chronic systolic (congestive) heart failure: Secondary | ICD-10-CM | POA: Insufficient documentation

## 2013-05-16 NOTE — Patient Instructions (Addendum)
Follow up 1 week  Increase milrinone drip at home to 0.375 mg  Increase lasix to 80 mg (2 tablets) once daily  Hospice referral

## 2013-05-16 NOTE — Progress Notes (Signed)
Patient ID: Nathan Mckinney, male   DOB: [birth year], 73 y.o.   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 Rollene Fare since 2001. He was recently admitted with AF, cardiogenic shock EF 20% with severe MR. Underwent DC-CV (01/2013).  Admitted 01/10/13 for A/C HF and started on milrinone and IV lasix and his BB and ACE-I were discontinued due to shock. He was diuresed total of 1.7 liters and was started back on low dose lisinopril 2.5 mg daily. Discussion during stay for VAD placement and started the process.  He was sent home on milrinone. We then had family meeting and decided not to pursue VAD due to declining cognitive function and lack of primary caregiver who could handle VAD.  Follow up: Last week had acute visit for dyspnea and CP. His fluid status was elevated and was instructed to take 80 mg lasix and then baseline lasix increased from 40 QOD to 40 mg daily. Weight up another 3 pounds.  Feels SOB is getting worse. No orthopnea or PND. Continues with CP. Sleeping poorly. Very fatigued. Hard to do ADLs. Takes dog out 3x per day but it is hard for him.   Labs (01/28/13) : TSH 5.0 T4 8.9 T3 14.3  Labs (10/14): K 4, creatinine 1.43, LDL 46 Labs (04/08/13) K 4.1 Creatinine 1.53  Labs (1/04/26/13) K 4.6 Creatinine 1.73   ROS: All systems negative except as listed in HPI, PMH and Problem List.  SH: Married, nonsmoker, lives in [place]  FH: No premature CAD  Past Medical History  Diagnosis Date  . Hypertension   . Chronic tension headaches   . Tinea cruris   . Tinea unguium   . Hernia, hiatal   . CVA (cerebral infarction)   . Systolic heart failure     EF 15-20% in 08/2012 (Echo at [facility], [place])  . Bradycardia   . Ventricular tachycardia   . Syncope   . CAD (coronary artery disease)     h/o MI in 2001, admitted for  possible STEMI at [facility] in 08/2012 - Cardiac cath at [facility] in 08/2012: Nonobstructive coronary artery disease  . Sleep disorder   . Atrial fibrillation     On Eliquis, new onset in 08/2012 - successful cardioversion to sinus rhythm  . Automatic implantable cardioverter-defibrillator in situ   . Pacemaker     ICD/PACEMAKER   . Arthritis     HANDS  . CHF (congestive heart failure)   . Anginal pain   . Depression   . Shortness of breath   . Pneumonia   . Heart murmur   . Stroke   . Mitral regurgitation   . Acute on chronic systolic heart failure-  7/0/6237  . Chronic anticoagulation- Eloquis   . CKD (chronic kidney disease) stage 3, GFR 30-59 ml/min   . 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 Atrial fibrillation, persistent, recurrent   . Chronic combined systolic and diastolic congestive heart failure   . Non-ischemic cardiomyopathy 1997  . Tricuspid regurgitation     Current Outpatient Prescriptions  Medication Sig Dispense Refill  . acetaminophen (TYLENOL) 500 MG tablet Take 1,000 mg by mouth every 6 (six) hours as needed for pain.      Marland Kitchen amiodarone (PACERONE) 200 MG tablet Take 1 tablet (200 mg total) by mouth daily.  30 tablet  6  . apixaban (ELIQUIS) 5 MG TABS tablet Take 5 mg by mouth 2 (two) times daily.      Marland Kitchen atorvastatin (LIPITOR) 20 MG tablet Take 20 mg by mouth daily.      . Cholecalciferol 10000 UNITS CAPS Take 1 capsule by mouth daily.      Marland Kitchen donepezil (ARICEPT) 10 MG tablet Take 10 mg by mouth daily.      . furosemide (LASIX) 40 MG tablet Take 1 tablet (40 mg total) by mouth daily. Take 40 mg daily and as needed.  60 tablet  11  . lisinopril (PRINIVIL,ZESTRIL) 5 MG tablet Take 2.5 mg by mouth daily. Hold if SBP < 100      . magnesium oxide (MAG-OX) 400 MG tablet Take 1 tablet (400 mg total) by mouth daily.  30 tablet  6  . milrinone (PRIMACOR) 20 MG/100ML SOLN infusion Inject 18.875 mcg/min into the vein continuous.  100 mL  6   No current  facility-administered medications for this encounter.   Filed Vitals:   05/16/13 1049  BP: 128/70  Pulse: 88  Resp: 18  Weight: 173 lb 4 oz (78.586 kg)  SpO2: 98%    PHYSICAL EXAM: General:  Fatigued appearing. No resp difficulty Wife present  HEENT: normal Neck: supple. JVP ~8-9. Carotids 2+ bilaterally; no bruits. No lymphadenopathy or thryomegaly appreciated. Cor: PMI laterally displaced. Regular rate & rhythm. No rubs. Soft s3  2/6 TR 3/6 MRHSM apex.  Lungs: clear Abdomen: soft, nontender, nondistended. No hepatosplenomegaly. No bruits or masses. Good bowel sounds. Extremities: no cyanosis, clubbing, rash, edema. RUE PICC line site looks good. No erythema  Neuro: alert & orientedx3, cranial nerves grossly intact. Moves all 4 extremities w/o difficulty. Affect pleasant.  ASSESSMENT & PLAN: 1. Chronic systolic CHF: Nonischemic cardiomyopathy, end stage.  Medtronic CRT-D device.  2. Paroxysmal atrial fibrillation:. Continue amiodarone and apixaban.   3. Mitral regurgitation: Severe on last echo.  Suspect functional.   He continues to deteriorate despite inotropic support long talk about his situation. He is now NYHA IV + volume overload. Cr 1.7->2.0 We will increase milrinone to 0.375 mcg/kg/min and refer to Hospice. Will also increase lasix 80 daily.   Lavalle Skoda 11:23 AM

## 2013-05-19 ENCOUNTER — Encounter: Payer: Self-pay | Admitting: Internal Medicine

## 2013-05-23 ENCOUNTER — Ambulatory Visit (HOSPITAL_COMMUNITY)
Admission: RE | Admit: 2013-05-23 | Discharge: 2013-05-23 | Disposition: A | Discharge status: Home / Self Care | Payer: Medicare Other | Source: Ambulatory Visit | Attending: Internal Medicine | Admitting: Internal Medicine

## 2013-05-23 ENCOUNTER — Other Ambulatory Visit (HOSPITAL_COMMUNITY): Payer: Self-pay

## 2013-05-23 VITALS — BP 92/60 | HR 89 | Wt 172.0 lb

## 2013-05-23 DIAGNOSIS — I4891 Unspecified atrial fibrillation: Secondary | ICD-10-CM | POA: Insufficient documentation

## 2013-05-23 DIAGNOSIS — I509 Heart failure, unspecified: Secondary | ICD-10-CM

## 2013-05-23 DIAGNOSIS — I5022 Chronic systolic (congestive) heart failure: Secondary | ICD-10-CM | POA: Insufficient documentation

## 2013-05-23 DIAGNOSIS — I5042 Chronic combined systolic (congestive) and diastolic (congestive) heart failure: Secondary | ICD-10-CM

## 2013-05-23 DIAGNOSIS — I059 Rheumatic mitral valve disease, unspecified: Secondary | ICD-10-CM | POA: Insufficient documentation

## 2013-05-23 MED ORDER — METOLAZONE 5 MG PO TABS: 5.0000 mg | ORAL_TABLET | Freq: Every day | ORAL | Status: DC | PRN

## 2013-05-23 MED ORDER — METOLAZONE 5 MG PO TABS: 5.0000 mg | ORAL_TABLET | Freq: Every day | ORAL | Status: AC | PRN

## 2013-05-23 NOTE — Progress Notes (Signed)
Patient ID: FRANSISCO MESSMER, male   DOB: [birth year], 73 y.o.   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 Rollene Fare since 2001. He was recently admitted with AF, cardiogenic shock EF 20% with severe MR. Underwent DC-CV (01/2013).  Admitted 01/10/13 for A/C HF and started on milrinone and IV lasix and his BB and ACE-I were discontinued due to shock. He was diuresed total of 1.7 liters and was started back on low dose lisinopril 2.5 mg daily. Discussion during stay for VAD placement and started the process.  He was sent home on milrinone. We then had family meeting and decided not to pursue VAD due to declining cognitive function and lack of primary caregiver who could handle VAD.  He returns for follow up with his wife and son.  Last visit Milrinone was increased to 0.375 mcg and lasix was increased to 80 mg daily. Weight at home 168 pounds. Says he feels worse with ongoing dyspnea with exertion and fatigue. Increased cough. He has been unable to walk his dog due to dyspnea. Says he is spending the majority of his day in the bed or in a chair.  Taking all medications.  [facility] following. Family ready for Hospice.   Labs (01/28/13) : TSH 5.0 T4 8.9 T3 14.3  Labs (10/14): K 4, creatinine 1.43, LDL 46 Labs (04/08/13) K 4.1 Creatinine 1.53  Labs (1/04/26/13) K 4.6 Creatinine 1.73  Labs (05/13/13) : K 5.0 Creatinine 2.0   ROS: All systems negative except as listed in HPI, PMH and Problem List.  SH: Married, nonsmoker, lives in [place]  FH: No premature CAD  Past Medical History  Diagnosis Date  . Hypertension   . Chronic tension headaches   . Tinea cruris   . Tinea unguium   . Hernia, hiatal   . CVA (cerebral infarction)   . Systolic heart failure     EF 15-20% in 08/2012 (Echo at [facility] [place])  . Bradycardia   . Ventricular  tachycardia   . Syncope   . CAD (coronary artery disease)     h/o MI in 2001, admitted for possible STEMI at [facility] in 08/2012 - Cardiac cath at [facility] in 08/2012: Nonobstructive coronary artery disease  . Sleep disorder   . Atrial fibrillation     On Eliquis, new onset in 08/2012 - successful cardioversion to sinus rhythm  . Automatic implantable cardioverter-defibrillator in situ   . Pacemaker     ICD/PACEMAKER   . Arthritis     HANDS  . CHF (congestive heart failure)   . Anginal pain   . Depression   . Shortness of breath   . Pneumonia   . Heart murmur   . Stroke   . Mitral regurgitation   . Acute on chronic systolic heart failure-  09/27/4852  . Chronic anticoagulation- Eloquis   . CKD (chronic kidney disease) stage 3, GFR 30-59 ml/min   . 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 Atrial fibrillation, persistent, recurrent   . Chronic combined systolic and diastolic congestive heart failure   . Non-ischemic cardiomyopathy 1997  . Tricuspid regurgitation     Current Outpatient Prescriptions  Medication Sig Dispense Refill  . acetaminophen (TYLENOL) 500 MG tablet Take 1,000 mg by mouth every 6 (six) hours as needed for pain.      Marland Kitchen amiodarone (  PACERONE) 200 MG tablet Take 1 tablet (200 mg total) by mouth daily.  30 tablet  6  . apixaban (ELIQUIS) 5 MG TABS tablet Take 5 mg by mouth 2 (two) times daily.      Marland Kitchen atorvastatin (LIPITOR) 20 MG tablet Take 20 mg by mouth daily.      . Cholecalciferol 10000 UNITS CAPS Take 1 capsule by mouth daily.      Marland Kitchen donepezil (ARICEPT) 10 MG tablet Take 10 mg by mouth daily.      . furosemide (LASIX) 40 MG tablet Take 80 mg by mouth daily. Take 40 mg daily and as needed.      Marland Kitchen lisinopril (PRINIVIL,ZESTRIL) 5 MG tablet Take 2.5 mg by mouth daily. Hold if SBP < 100      . magnesium oxide (MAG-OX) 400 MG tablet Take 1 tablet (400 mg total) by mouth daily.  30 tablet  6  . milrinone (PRIMACOR) 20 MG/100ML SOLN infusion Inject 0.375  mcg/kg/min into the vein continuous.       No current facility-administered medications for this encounter.   Filed Vitals:   05/23/13 1206  BP: 92/60  Pulse: 89  Weight: 172 lb (78.019 kg)  SpO2: 93%    PHYSICAL EXAM: General:  Fatigued appearing. No resp difficulty Wife and son present  HEENT: normal Neck: supple. JVP ~7-8  Carotids 2+ bilaterally; no bruits. No lymphadenopathy or thryomegaly appreciated. Cor: PMI laterally displaced. Regular rate & rhythm. No rubs. Soft s3  2/6 TR 3/6 MRHSM apex.  Lungs: clear Abdomen: soft, nontender, nondistended. No hepatosplenomegaly. No bruits or masses. Good bowel sounds. Extremities: no cyanosis, clubbing, rash, edema. RUE PICC line site looks good. No erythema  Neuro: alert & orientedx3, cranial nerves grossly intact. Moves all 4 extremities w/o difficulty. Affect pleasant.  ASSESSMENT & PLAN: 1. Chronic systolic CHF: Nonischemic cardiomyopathy, end stage.  Medtronic CRT-D device. Dr Haroldine Laws had a lengthy discussion regarding his current condition they have elected to turn off his ICD. Medtronic notified and turned off in clinic. Will turn down Milrinone to 0.25 mcg and continue to titrate off. Will refer back to Hospice.   2. Paroxysmal atrial fibrillation:. Continue amiodarone and apixaban.   3. Mitral regurgitation: Severe on last echo.  Suspect functional.   Follow up in 2 weeks.   CLEGG,AMY NP-C  12:18 PM  Patient seen and examined with Darrick Grinder, NP. We discussed all aspects of the encounter. I agree with the assessment and plan as stated above.   He has not responded to increased milrinone and actually feels worse. I think he has clearly reached the point where Hospice Care is the best option. We had a long talk with him and his family about this and he is clear he does not want to suffer but he is not completely sure he wants to start [place] at this point. We have decided to deactivate ICD to avoid shocks and the  discomfort associated with them. We will turn milrinone down to 0.25 and see how he feels. If no change will turn it down to 0.125 in 2 days - with possible wean to off depending on how he feels. He will get back to [place] with decision on Hospice Care. Can use metolazone 5mg  prn for volume overload.   Total time spent 45 minutes with 2/3 of that time spent in above discussions.   Benay Spice 6:28 PM

## 2013-05-24 ENCOUNTER — Telehealth (HOSPITAL_COMMUNITY): Payer: Self-pay | Admitting: Cardiology

## 2013-05-24 NOTE — Telephone Encounter (Signed)
Open in error

## 2013-05-26 ENCOUNTER — Telehealth (HOSPITAL_COMMUNITY): Payer: Self-pay | Admitting: Cardiology

## 2013-05-26 NOTE — Telephone Encounter (Signed)
Per Dr Haroldine Laws ok to draw weekly labs as usual on 2/6 and to leave PICC in for a week, left detailed mess for [facility]

## 2013-05-26 NOTE — Telephone Encounter (Signed)
Scott,RN with [facility] called, would like to know after milrinone is decreased to .6 mg on 05/27/13, can PICC line be removed and will we need labs at that time?   Please advise

## 2013-05-27 ENCOUNTER — Other Ambulatory Visit (HOSPITAL_COMMUNITY): Payer: Self-pay

## 2013-05-27 ENCOUNTER — Telehealth (HOSPITAL_COMMUNITY): Payer: Self-pay | Admitting: Cardiology

## 2013-05-27 MED ORDER — BENZONATATE 100 MG PO CAPS: 100.0000 mg | ORAL_CAPSULE | Freq: Three times a day (TID) | ORAL | Status: AC | PRN

## 2013-05-27 NOTE — Telephone Encounter (Signed)
pts wife called to report pts pacemaker has shocked him twice, once last night and once this am. Wife would like to know if this is something she should be alarmed about?   Please advise

## 2013-05-30 ENCOUNTER — Encounter: Payer: Self-pay | Admitting: Family Medicine

## 2013-05-30 NOTE — Telephone Encounter (Signed)
Nathan Mckinney addressed with pt on Fri 2/6, defib was turned off at [facility] last week

## 2013-06-03 ENCOUNTER — Telehealth (HOSPITAL_COMMUNITY): Payer: Self-pay | Admitting: *Deleted

## 2013-06-03 ENCOUNTER — Ambulatory Visit (HOSPITAL_COMMUNITY)
Admission: RE | Admit: 2013-06-03 | Discharge: 2013-06-03 | Disposition: A | Discharge status: Home / Self Care | Payer: Medicare Other | Source: Ambulatory Visit | Attending: Internal Medicine | Admitting: Internal Medicine

## 2013-06-03 DIAGNOSIS — I5022 Chronic systolic (congestive) heart failure: Secondary | ICD-10-CM

## 2013-06-03 NOTE — Telephone Encounter (Signed)
Had spoken with Jackelyn Poling, on Wed regarding pt and gave her a verbal order to d/c PICC line per Dr Haroldine Laws, however she pulled it back about 4 inc and meet resistance, she called back and stated she had worked with it for quit a while trying all kinds of techniques and couldn't get it to budge.  Discussed with Carolynn Sayers, RN with [facility] she called and spoke w/Debbie and they decided to leave PICC and try again on Thur.  Debbie called back on Thur afternoon to report PICC was still not moving and she didn't what else to try, per Dr Haroldine Laws pt will need to come to intervention radiology to have removed, order placed and pt's wife aware to bring him over when they get ready

## 2013-06-09 ENCOUNTER — Encounter: Payer: Self-pay | Admitting: Internal Medicine

## 2013-06-10 ENCOUNTER — Telehealth (HOSPITAL_COMMUNITY): Payer: Self-pay | Admitting: Cardiology

## 2013-06-10 ENCOUNTER — Telehealth (HOSPITAL_COMMUNITY): Payer: Self-pay

## 2013-06-10 NOTE — Telephone Encounter (Signed)
Patient and wife called to make aware that they do not need to come to appointment this week as long as patient is comfortable and s/s are well managed with hospice.  nesured that they are more than welcome to call us at any time with wuestions, or if they feel the need to be seen by [place]. Renee Pain

## 2013-06-10 NOTE — Telephone Encounter (Signed)
Debbie with [facility] called on behalf of pts wife Wife/family would like to know if follow up appts are needed anymore? Pt scheduled next week and wife feels he may not need to come in for a visit at this point

## 2013-06-13 ENCOUNTER — Encounter (HOSPITAL_COMMUNITY): Payer: Medicare Other

## 2013-06-23 ENCOUNTER — Encounter: Payer: Self-pay | Admitting: Internal Medicine

## 2013-06-24 ENCOUNTER — Other Ambulatory Visit: Payer: Self-pay | Admitting: Internal Medicine

## 2013-07-06 ENCOUNTER — Encounter: Payer: Self-pay | Admitting: Internal Medicine

## 2013-07-15 ENCOUNTER — Telehealth (HOSPITAL_COMMUNITY): Payer: Self-pay | Admitting: *Deleted

## 2013-07-15 NOTE — Telephone Encounter (Signed)
Received call from Hospice RN she is concerned about pt, she states he generally doesn't feel well, BP 104/62 no edema slight abd tightness and unable to lay flat, he has been sleeping on couch propped up with pillow for past week and a half, his wt was up over weekend and he took metolazone which brought his wt back down and it is stable now around 180 but did not really help with SOB, pt also c/o cramp in leg, per Junie Bame, NP get stat bmet and have pt take metolazone today as well as extra lasix today and tomorrow she is agreeable and will draw bmet and send it stat to check K level

## 2013-07-20 ENCOUNTER — Telehealth (HOSPITAL_COMMUNITY): Payer: Self-pay | Admitting: Cardiology

## 2013-07-20 MED ORDER — METOLAZONE 5 MG PO TABS: ORAL_TABLET | ORAL | Status: DC

## 2013-07-20 MED ORDER — PROCHLORPERAZINE MALEATE 10 MG PO TABS: 10.0000 mg | ORAL_TABLET | Freq: Four times a day (QID) | ORAL | Status: AC | PRN

## 2013-07-20 MED ORDER — MORPHINE SULFATE (CONCENTRATE) 20 MG/ML PO SOLN: 5.0000 mg | Freq: Four times a day (QID) | ORAL | Status: AC | PRN

## 2013-07-20 MED ORDER — ALLOPURINOL 100 MG PO TABS: 100.0000 mg | ORAL_TABLET | Freq: Every day | ORAL | Status: AC

## 2013-07-20 MED ORDER — BENZONATATE 100 MG PO CAPS: 100.0000 mg | ORAL_CAPSULE | Freq: Three times a day (TID) | ORAL | Status: DC | PRN

## 2013-07-20 MED ORDER — FUROSEMIDE 40 MG PO TABS: 40.0000 mg | ORAL_TABLET | Freq: Two times a day (BID) | ORAL | Status: AC

## 2013-07-20 MED ORDER — SENNOSIDES-DOCUSATE SODIUM 8.6-50 MG PO TABS: 1.0000 | ORAL_TABLET | Freq: Every day | ORAL | Status: AC

## 2013-07-20 NOTE — Telephone Encounter (Signed)
Teresa,RN with Hospice called to review/ update pt current medication list. pts wife is assisting with meds and RN wanted to be sure everyone had the same list in case wife called with questions Medications reviewed in detail gave d/c order for digoxin

## 2013-08-12 ENCOUNTER — Encounter: Payer: Self-pay | Admitting: Internal Medicine

## 2013-09-16 ENCOUNTER — Ambulatory Visit (INDEPENDENT_AMBULATORY_CARE_PROVIDER_SITE_OTHER): Admitting: *Deleted

## 2013-09-16 DIAGNOSIS — I509 Heart failure, unspecified: Secondary | ICD-10-CM

## 2013-09-16 DIAGNOSIS — I4729 Other ventricular tachycardia: Secondary | ICD-10-CM

## 2013-09-16 DIAGNOSIS — I5042 Chronic combined systolic (congestive) and diastolic (congestive) heart failure: Secondary | ICD-10-CM

## 2013-09-16 DIAGNOSIS — I4891 Unspecified atrial fibrillation: Secondary | ICD-10-CM

## 2013-09-16 DIAGNOSIS — I4819 Other persistent atrial fibrillation: Secondary | ICD-10-CM

## 2013-09-16 DIAGNOSIS — I5023 Acute on chronic systolic (congestive) heart failure: Secondary | ICD-10-CM

## 2013-09-16 DIAGNOSIS — I472 Ventricular tachycardia: Secondary | ICD-10-CM

## 2013-09-16 LAB — MDC_IDC_ENUM_SESS_TYPE_INCLINIC
Battery Remaining Longevity: 35 mo
Battery Voltage: 2.95 V
Brady Statistic AP VP Percent: 0.64 %
Brady Statistic AS VP Percent: 98.35 %
Brady Statistic RA Percent Paced: 0.68 %
Date Time Interrogation Session: 20150529165248
HighPow Impedance: 228 Ohm
HighPow Impedance: 48 Ohm
HighPow Impedance: 49 Ohm
Lead Channel Impedance Value: 190 Ohm
Lead Channel Impedance Value: 418 Ohm
Lead Channel Impedance Value: 418 Ohm
Lead Channel Impedance Value: 456 Ohm
Lead Channel Pacing Threshold Amplitude: 1 V
Lead Channel Pacing Threshold Amplitude: 1.75 V
Lead Channel Pacing Threshold Pulse Width: 0.4 ms
Lead Channel Pacing Threshold Pulse Width: 0.7 ms
Lead Channel Pacing Threshold Pulse Width: 1.5 ms
Lead Channel Sensing Intrinsic Amplitude: 0.125 mV
Lead Channel Sensing Intrinsic Amplitude: 0.125 mV
Lead Channel Setting Pacing Amplitude: 2 V
Lead Channel Setting Pacing Amplitude: 2.75 V
Lead Channel Setting Pacing Amplitude: 4.5 V
Lead Channel Setting Pacing Pulse Width: 0.7 ms
Lead Channel Setting Sensing Sensitivity: 0.3 mV
MDC IDC MSMT LEADCHNL LV IMPEDANCE VALUE: 342 Ohm
MDC IDC MSMT LEADCHNL RA PACING THRESHOLD AMPLITUDE: 3.5 V
MDC IDC MSMT LEADCHNL RV SENSING INTR AMPL: 5.125 mV
MDC IDC MSMT LEADCHNL RV SENSING INTR AMPL: 5.625 mV
MDC IDC SET LEADCHNL RV PACING PULSEWIDTH: 0.4 ms
MDC IDC SET ZONE DETECTION INTERVAL: 260 ms
MDC IDC SET ZONE DETECTION INTERVAL: 360 ms
MDC IDC STAT BRADY AP VS PERCENT: 0.03 %
MDC IDC STAT BRADY AS VS PERCENT: 0.98 %
MDC IDC STAT BRADY RV PERCENT PACED: 96.24 %
Zone Setting Detection Interval: 310 ms
Zone Setting Detection Interval: 420 ms
Zone Setting Detection Interval: 430 ms

## 2013-09-16 LAB — PACEMAKER DEVICE OBSERVATION

## 2013-09-16 NOTE — Progress Notes (Signed)
CRT-D device check in office due to audible alert. Thresholds and sensing consistent with previous device measurements. Lead impedance trends stable over time. SIC=0. No mode switch episodes recorded. No ventricular arrhythmia episodes recorded. Patient bi-ventricularly pacing 97.1% of the time with 0.9 as VSRp. Device programmed with appropriate safety margins. Heart failure diagnostics reviewed and trends are unstable for patient---pt was offered an appt to see [facility] within the next month---family declined stating that he was now under Hospice care and was too weak to continue ov F/U. Estimated longevity 3 years. Plan to check device remotely in 3 months and see in office in 6 months. Patient will follow up with [facility] in 3 months.

## 2013-09-20 ENCOUNTER — Encounter: Payer: Self-pay | Admitting: Cardiovascular Disease

## 2013-10-06 ENCOUNTER — Telehealth: Payer: Self-pay

## 2013-10-06 NOTE — Telephone Encounter (Signed)
Dr. Haroldine Laws signed Death Certificate

## 2013-10-19 DEATH — deceased

## 2013-12-20 ENCOUNTER — Encounter: Payer: Medicare Other | Admitting: Cardiovascular Disease

## 2014-03-30 ENCOUNTER — Encounter (HOSPITAL_COMMUNITY): Payer: Self-pay | Admitting: Internal Medicine

## 2014-10-10 IMAGING — CR DG CHEST 1V
1 series · 1 of 1 positions shown · non-contrast
Comparison: Chest x-ray 10/21/2012.

CLINICAL DATA: Chest pain.

CHEST - 1 VIEW

[w chest pa]
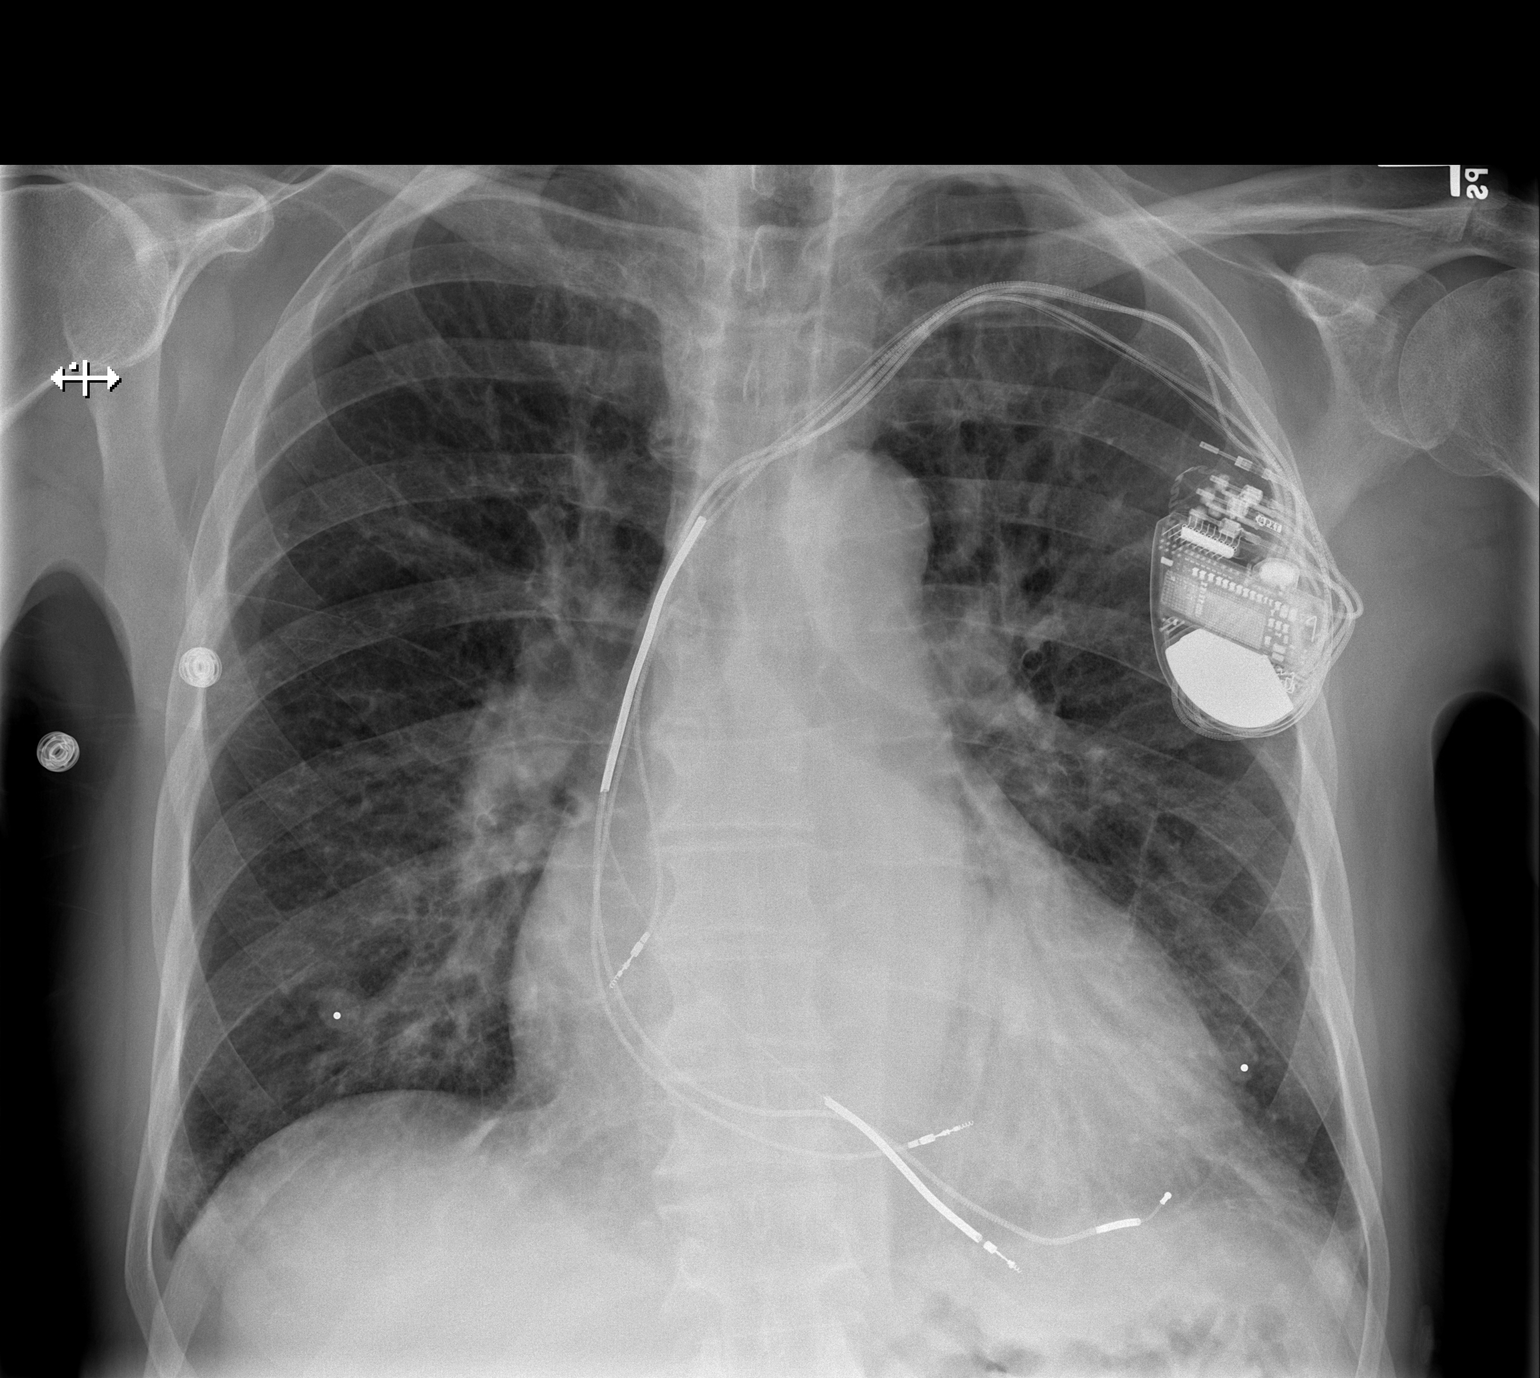

[1 of 1 positions shown; findings below may reference images not displayed]

FINDINGS: Ill-defined bibasilar opacities (left greater than right)
are noted, with an appearance suggestive of some bronchiectasis in
the left lower lobe.  No definite pleural effusions.  Mild
pulmonary venous congestion, without frank pulmonary edema.  Mild
cardiomegaly is unchanged.  Left-sided biventricular pacemaker/AICD
in place with lead tips projecting over the expected location of
the right atrium, right ventricular apex and overlying the lateral
wall of the left ventricle via the coronary sinus and coronary
veins.  Previously noted nodular opacity in the lateral aspect of
the lower right lung corresponds to the patient's nipple (nipple
markers are placed on today's examination).
IMPRESSION: 1.  Ill-defined bibasilar opacities are favored to represent a
combination of atelectasis and/or scarring, and there may be some
very mild cylindrical bronchiectasis in the left lower lobe.
2.  Previous suspected nodule in the lower right hemithorax
corresponds to the patient's nipple (this is a benign finding).
3.  Mild cardiomegaly.

## 2014-10-10 IMAGING — CR DG CHEST 2V
1 series · 1 of 1 positions shown · non-contrast
Comparison: 02/27/2011

CLINICAL DATA: Chest pain for 1 month with cough and shortness of
breath.

CHEST - 2 VIEW

[view not recorded]
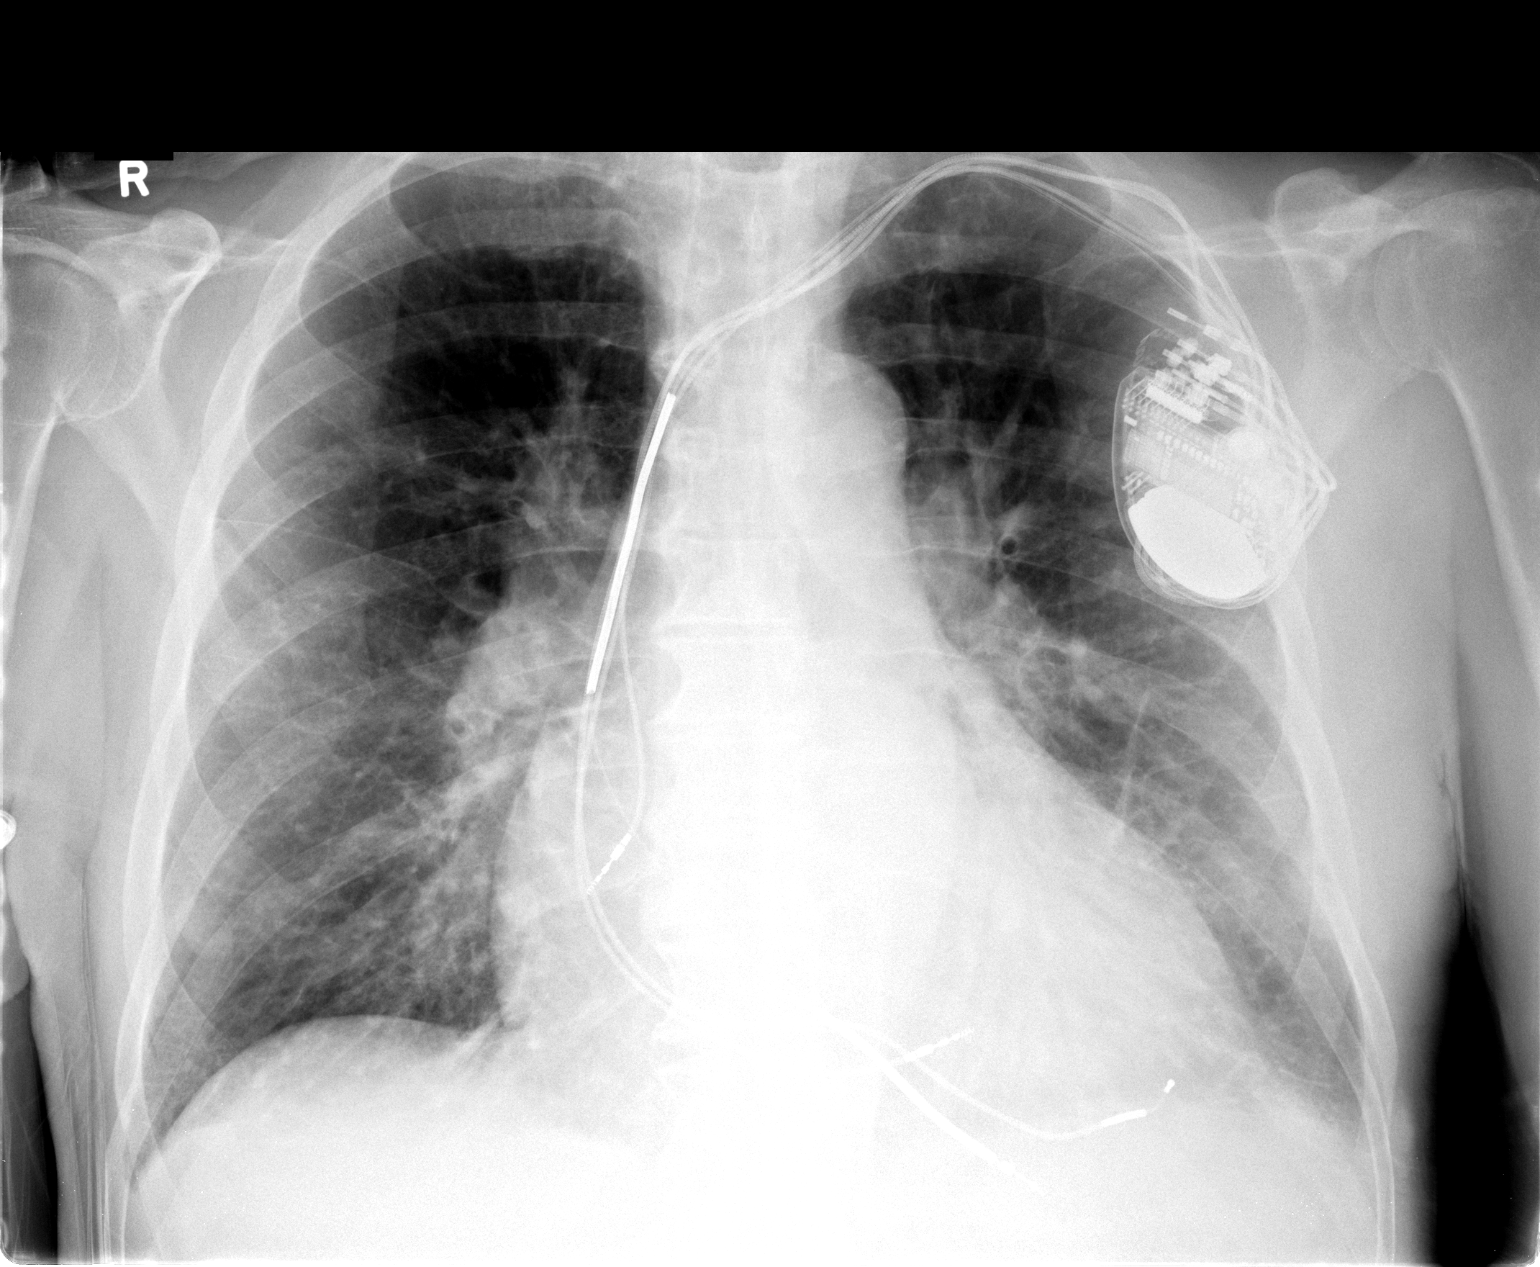

[1 of 1 positions shown; findings below may reference images not displayed]

FINDINGS: The cardiac silhouette is mildly enlarged.  No
mediastinal or hilar masses.  The lungs show mild interstitial
thickening and mild chronic bronchitic change in the lower lobes,
stable.  There is no infiltrate or pulmonary edema.

There is a nodular shadow projecting over the lateral right lower
lung on the AP view not evident on the lateral view.  This may
reflect a nipple shadow.  Recommend follow-up / repeat frontal
chest radiograph with nipple markers.  No pleural effusion or
pneumothorax.

Left anterior chest wall AICD is stable well-positioned.

The bony thorax is intact.
IMPRESSION: No acute cardiopulmonary disease.

Nodular opacity at the right lateral lung base on the AP view only.
Recommend repeat frontal view with nipple markers.

## 2014-11-18 IMAGING — US US ABDOMEN COMPLETE
1 series · 13 of 25 positions shown · non-contrast
Comparison: Chest CT 01/03/2010.  Abdominal ultrasound 11/28/2009.

CLINICAL DATA: Acute renal failure.  Elevated bilirubin and
hypertension.

COMPLETE ABDOMINAL ULTRASOUND

[Series 1: us abdomen complete · 0.31mm/px · 13 of 98 slices shown]
[im 1/98]
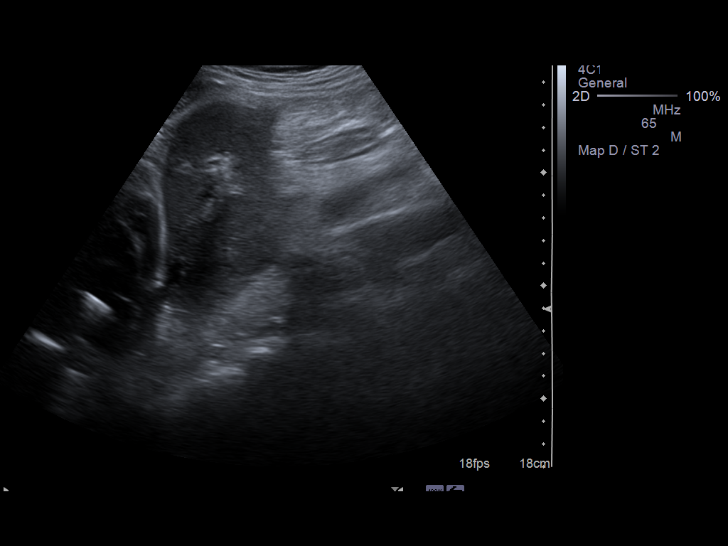
[im 9/98]
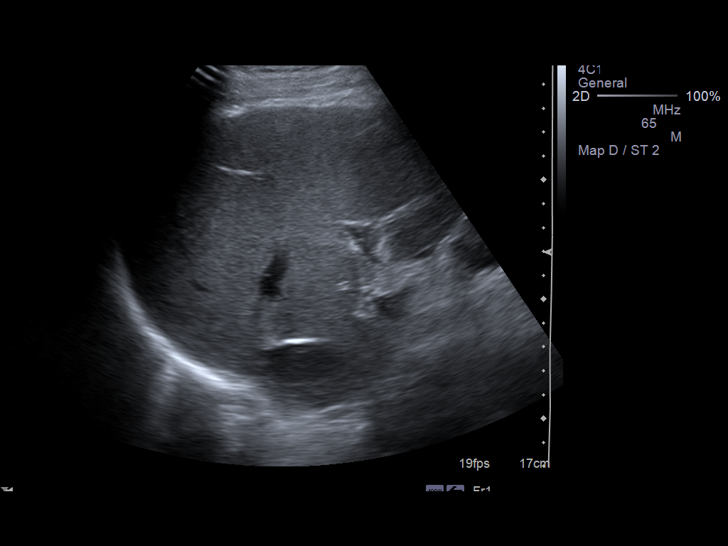
[im 17/98]
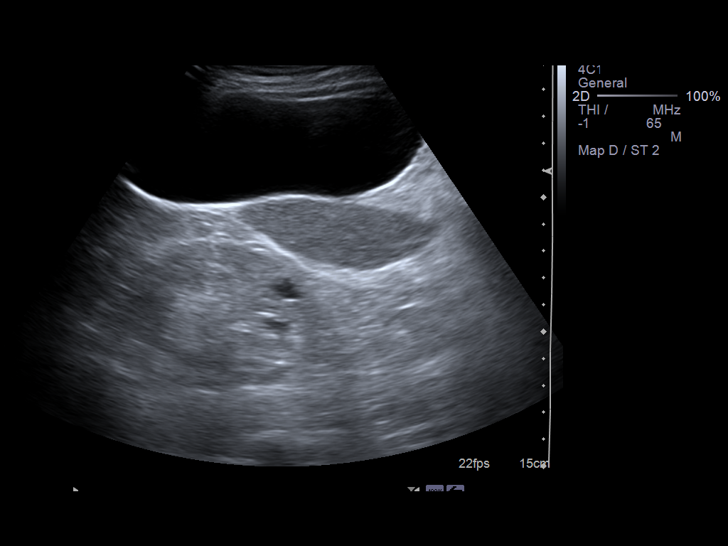
[im 25/98]
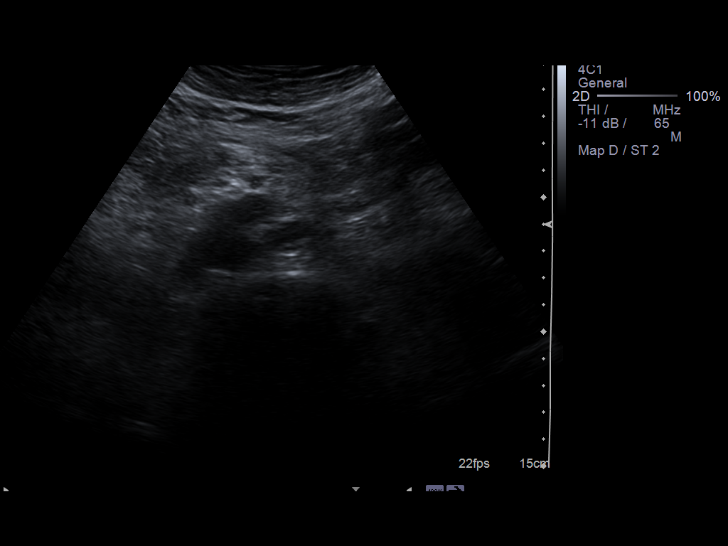
[im 33/98]
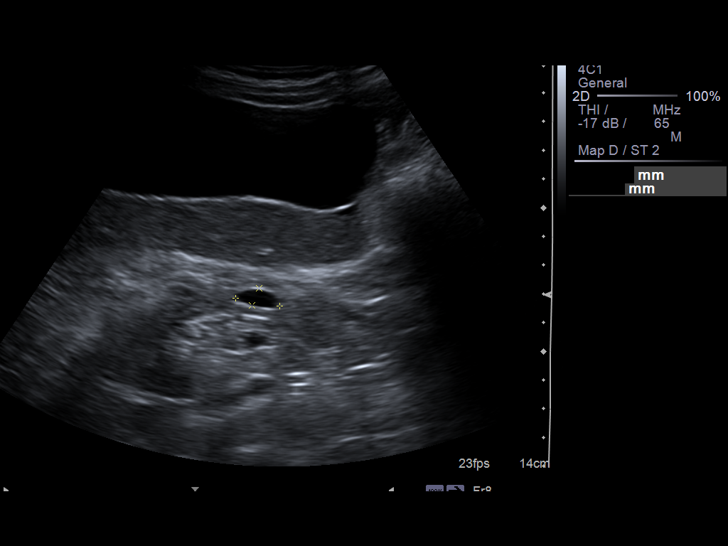
[im 41/98]
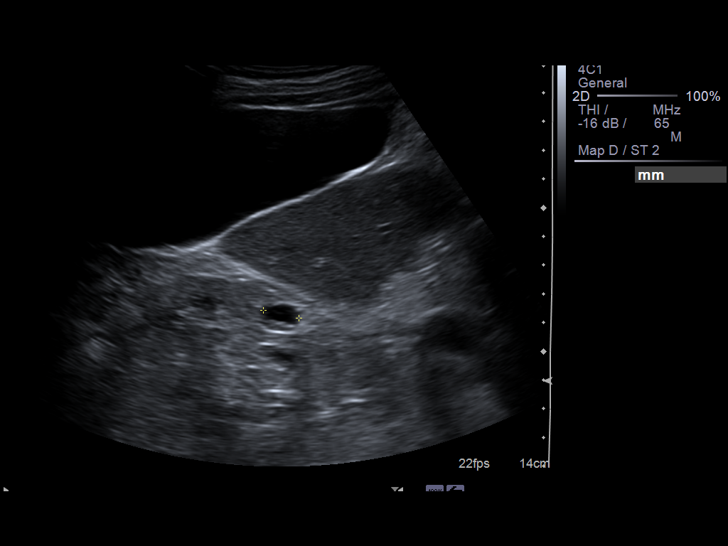
[im 49/98]
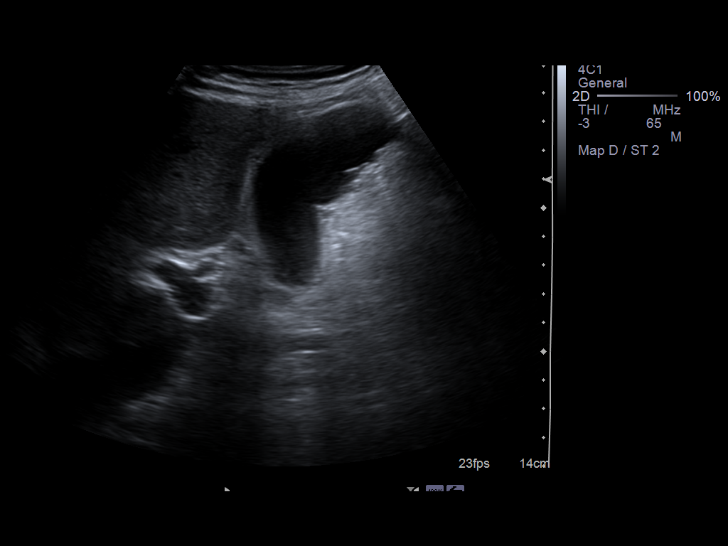
[im 57/98]
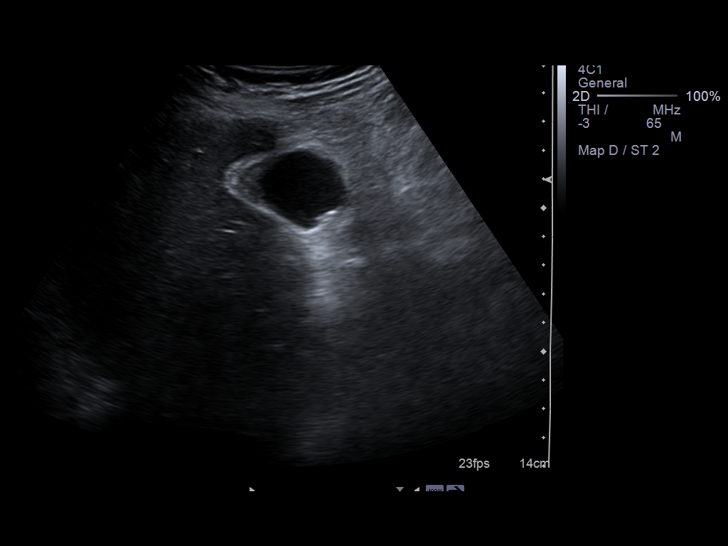
[im 65/98]
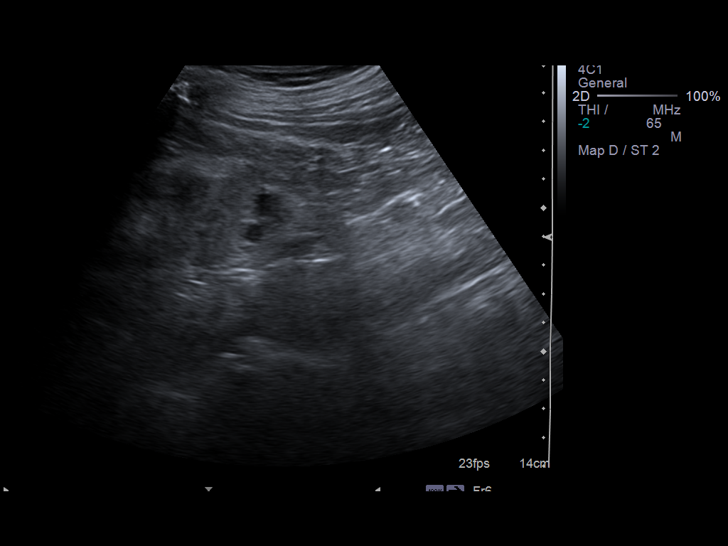
[im 73/98]
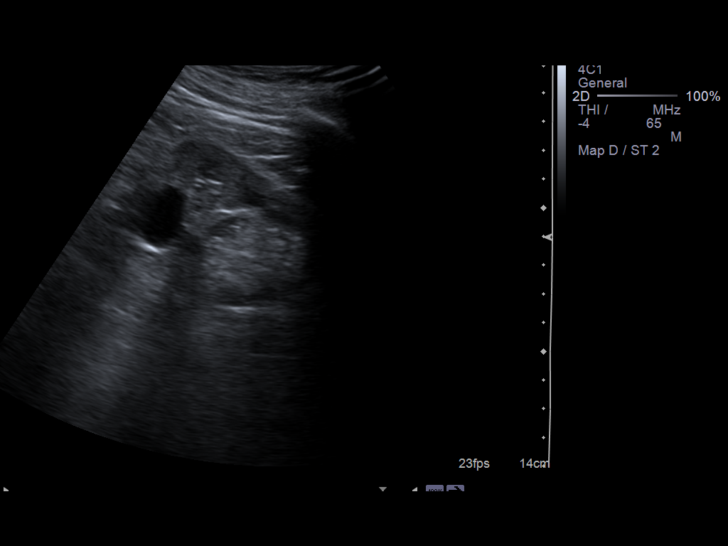
[im 81/98]
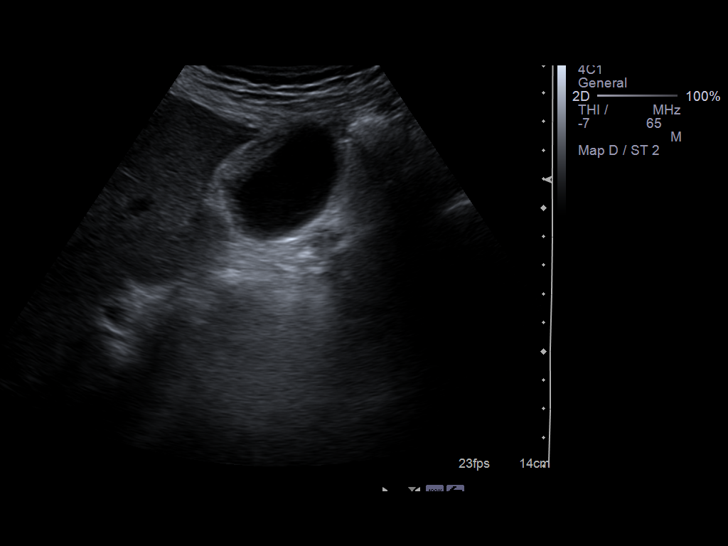
[im 89/98]
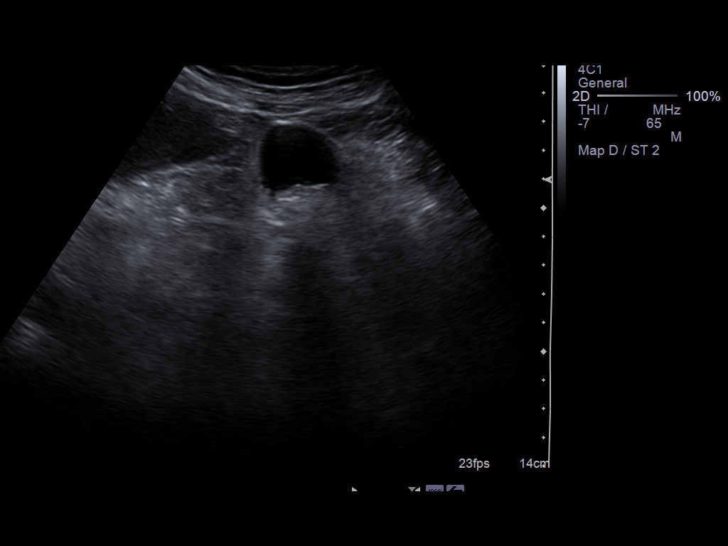
[im 98/98]
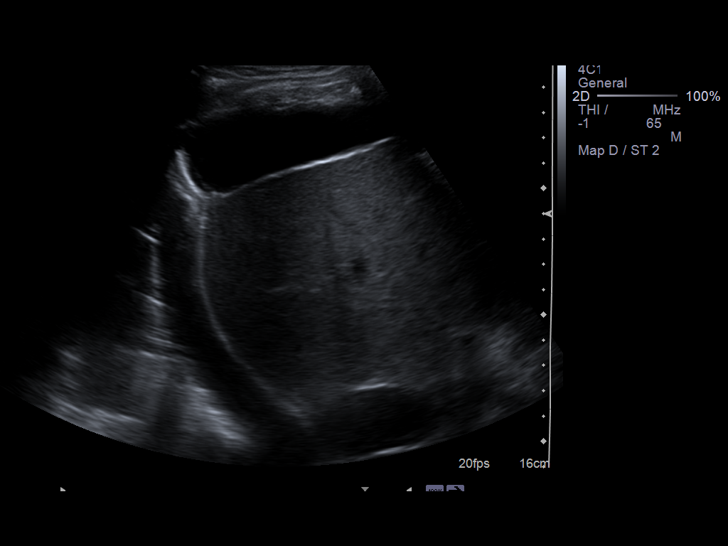

[13 of 25 positions shown; findings below may reference images not displayed]

FINDINGS: Gallbladder: Gallstones and sludge are noted.  There is focal
thickening of the gallbladder wall to 9 mm.  Sonographic Murphy's
sign is absent.

Common bile duct:   Normal in caliber without filling defects.

Liver:  Echogenicity is within normal limits.  No focal hepatic
abnormalities are identified.

IVC:  Visualized portions appear unremarkable.

Pancreas:  Visualized portions appear unremarkable.

Spleen:  Visualized portions appear unremarkable.

Right Kidney:  There is cortical thinning with increased
echogenicity.  No hydronephrosis is present.  There is a small cyst
measuring 1.6 cm maximally.  Renal length 10.3 cm.

Left Kidney: There is cortical thinning with increased
echogenicity.  Several simple cysts are noted, measuring up to
cm in diameter.  There is no hydronephrosis.  Renal length 10.5 cm.

Abdominal aorta:  Visualized portions appear unremarkable.

There are bilateral pleural effusions.  A cystic structure is noted
wrapping around the anterolateral aspect of the liver, measuring
approximately 11.6 x 5.2 x 11.6 cm.  This was partially imaged on
prior chest CT and appears grossly unchanged.  This was reported on
a CT done 02/04/2003 (no longer available for comparison) and was
queried to reflect a peritoneal cyst.
IMPRESSION: 1.  Gallstones and sludge with focal thickening of the gallbladder
wall.  There is no sonographic Murphy's sign to suggest acute
cholecystitis, although chronic inflammation cannot be excluded.
2.  No biliary dilatation.
3.  Stable cyst lateral to the liver, reported on prior studies
dating back to 2668 and likely an incidental peritoneal cyst.
4.  Bilateral renal cortical thinning consistent with medical renal
disease.  No hydronephrosis.

## 2015-01-07 IMAGING — CT CT HEAD W/O CM
2 series · 15 of 30 positions shown, 19 images · non-contrast
Comparison: None.

CLINICAL DATA: Pre LVAD placement.

EXAM:
CT HEAD WITHOUT CONTRAST
TECHNIQUE: Contiguous axial images were obtained from the base of the skull
through the vertex without intravenous contrast.

[Series 3: head w/o · axial · non-contrast · 0.49mm/px · z∈[+87,+232]mm · 13 of 35 slices shown, 17 images]
[im 3/35  brain]
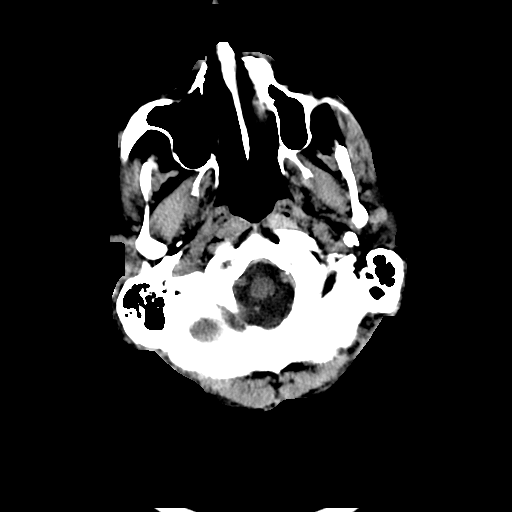
[im 3/35  bone]
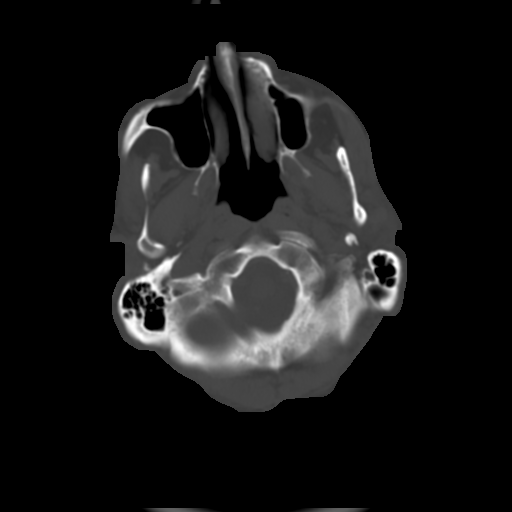
[im 5/35  brain]
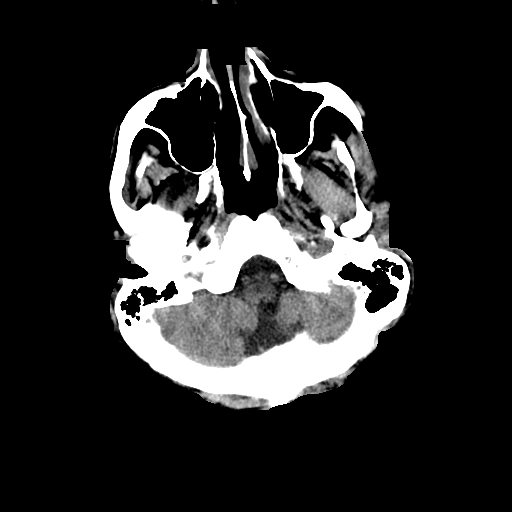
[im 8/35  brain]
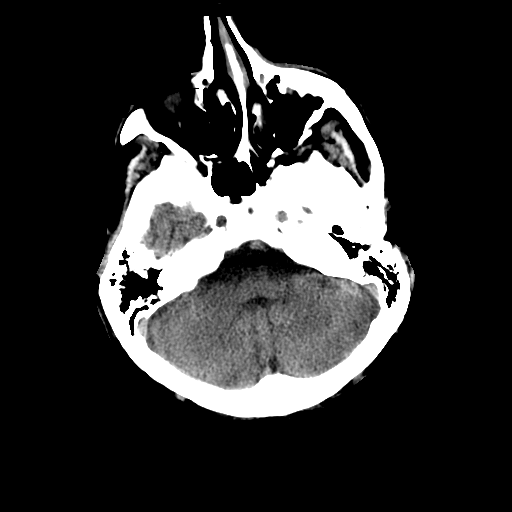
[im 10/35  brain]
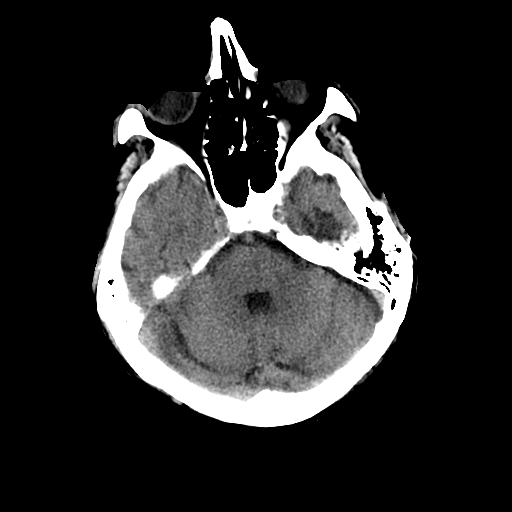
[im 13/35  brain]
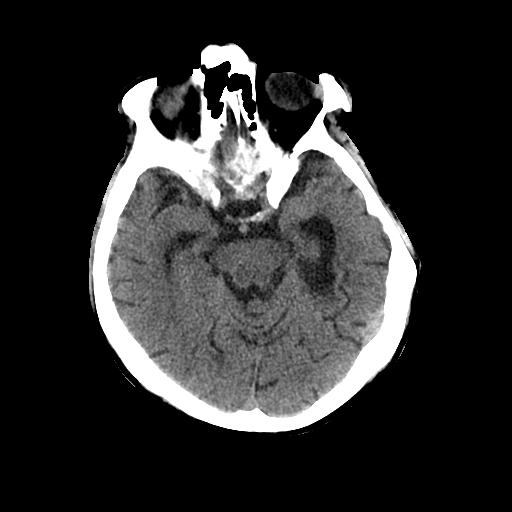
[im 13/35  bone]
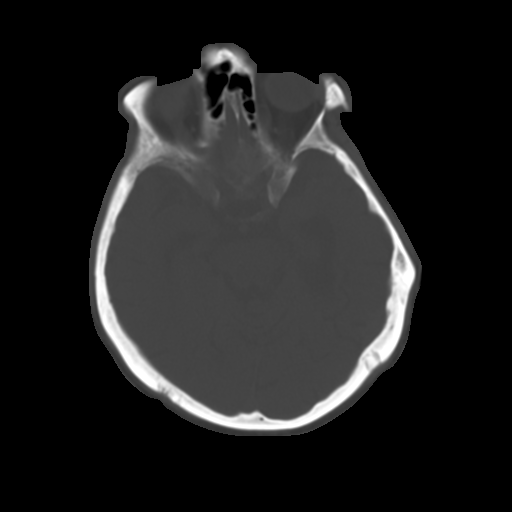
[im 15/35  brain]
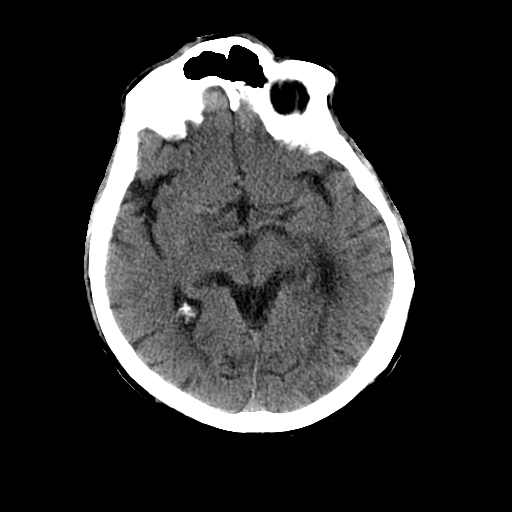
[im 18/35  brain]
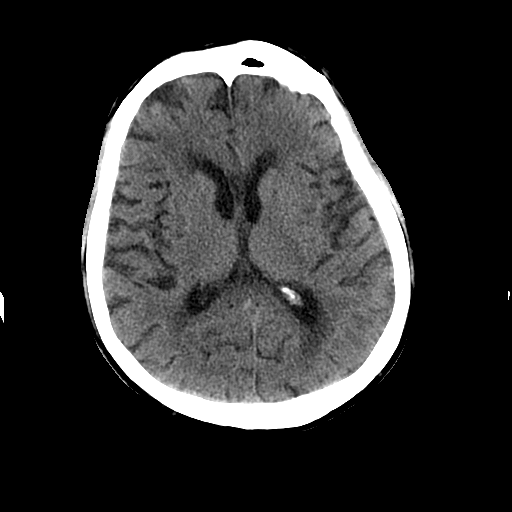
[im 20/35  brain]
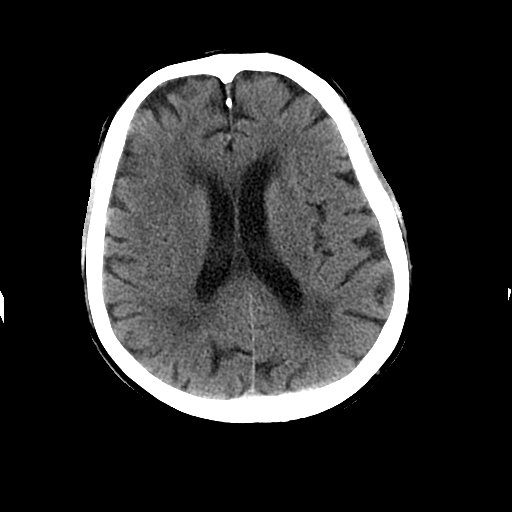
[im 22/35  brain]
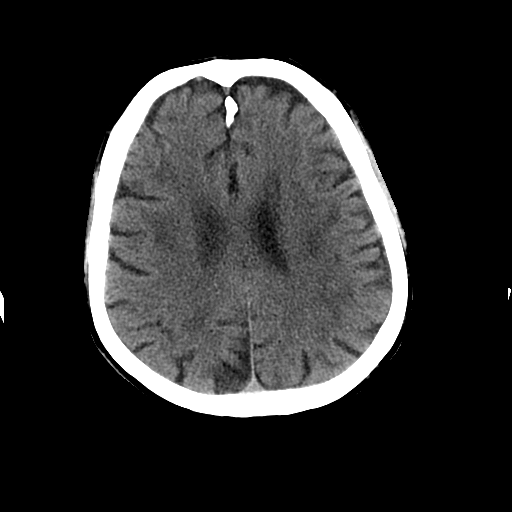
[im 22/35  bone]
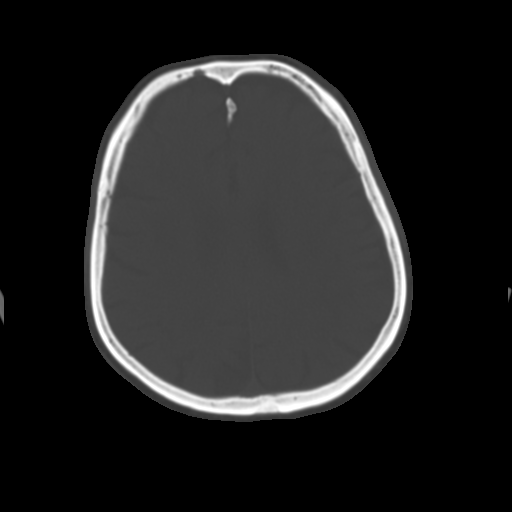
[im 25/35  brain]
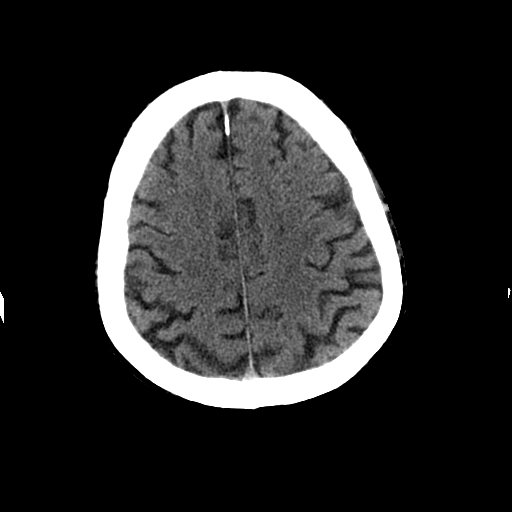
[im 27/35  brain]
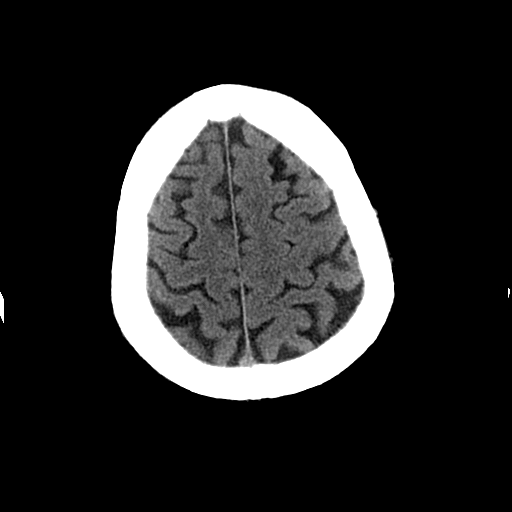
[im 30/35  brain]
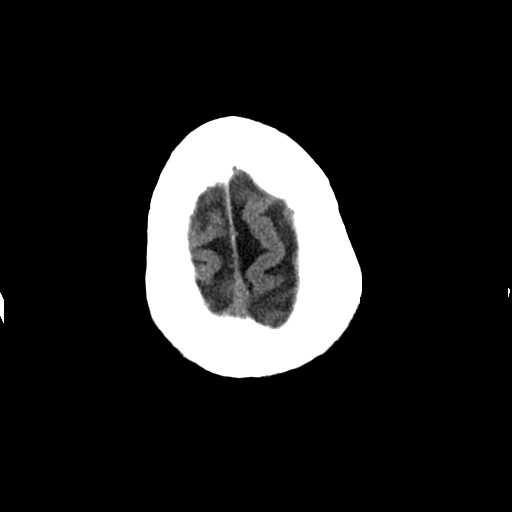
[im 32/35  brain]
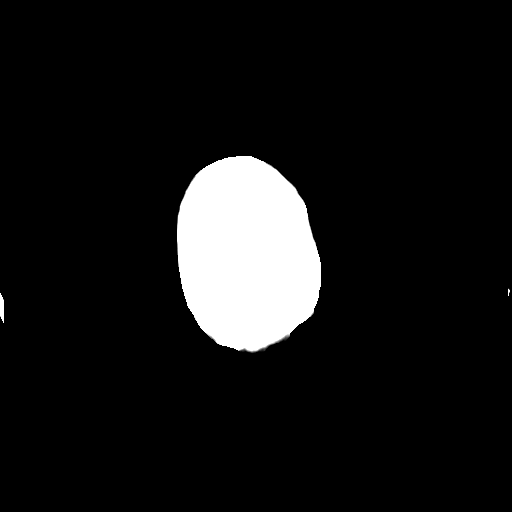
[im 32/35  bone]
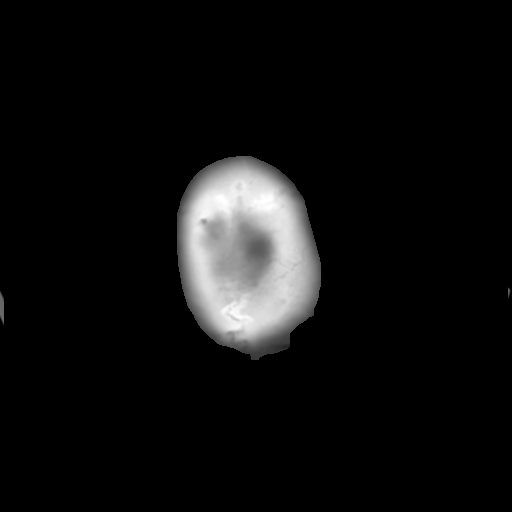

[Series 4: head w/o bone · axial · non-contrast · 0.49mm/px · z∈[+87,+112]mm · 2 of 35 slices shown]
[im 3/35  bone]
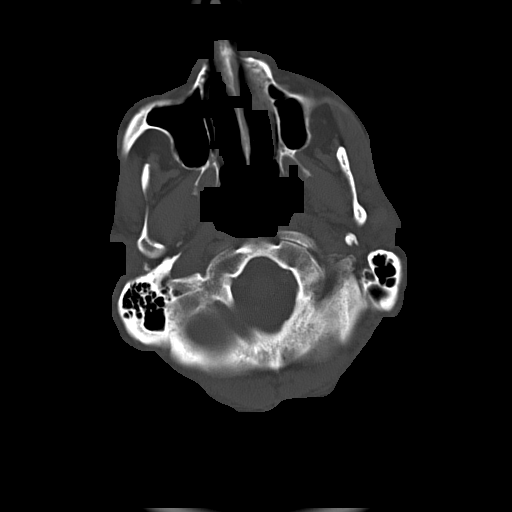
[im 8/35  bone]
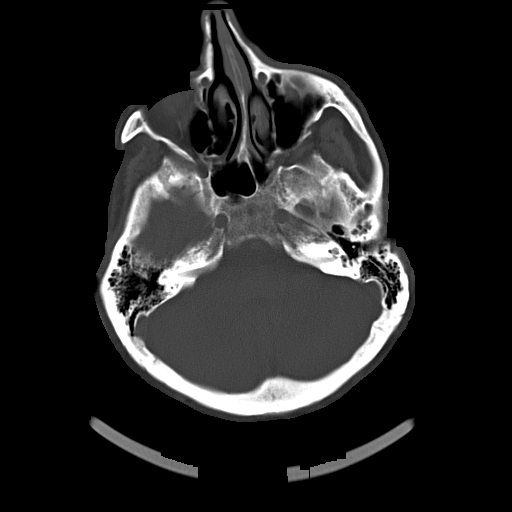

[15 of 30 positions shown; findings below may reference images not displayed]

FINDINGS: Age related cerebral atrophy, ventriculomegaly and periventricular
white matter disease. The temporal horn of the left lateral
ventricle is dilated. Suspect remote temporal lobe infarct with
encephalomalacia. No extra-axial fluid collections are identified.
No CT findings for acute hemispheric infarction or intracranial
hemorrhage. No mass lesions.

The bony structures are intact. No acute fracture. The paranasal
sinuses and mastoid air cells are grossly clear. The globes are
intact.
IMPRESSION: Remote left temporal lobe infarct with encephalomalacia and ex vacuo
dilatation of the temporal horn.

Age related cerebral atrophy, ventriculomegaly and periventricular
white matter disease.

No acute intracranial findings or mass lesion.
